# Patient Record
Sex: Female | Born: 1950 | Race: Black or African American | Hispanic: No | Smoking: Former smoker
Health system: Southern US, Community
[De-identification: ages and names within clinical notes are randomized; demographics above are authoritative.]

## PROBLEM LIST (undated history)

## (undated) DIAGNOSIS — I10 Essential (primary) hypertension: Secondary | ICD-10-CM

## (undated) DIAGNOSIS — E785 Hyperlipidemia, unspecified: Secondary | ICD-10-CM

## (undated) DIAGNOSIS — I499 Cardiac arrhythmia, unspecified: Secondary | ICD-10-CM

## (undated) DIAGNOSIS — Z9981 Dependence on supplemental oxygen: Secondary | ICD-10-CM

## (undated) DIAGNOSIS — J9620 Acute and chronic respiratory failure, unspecified whether with hypoxia or hypercapnia: Secondary | ICD-10-CM

## (undated) DIAGNOSIS — E039 Hypothyroidism, unspecified: Secondary | ICD-10-CM

## (undated) DIAGNOSIS — Z780 Asymptomatic menopausal state: Secondary | ICD-10-CM

## (undated) DIAGNOSIS — J449 Chronic obstructive pulmonary disease, unspecified: Secondary | ICD-10-CM

## (undated) DIAGNOSIS — J189 Pneumonia, unspecified organism: Secondary | ICD-10-CM

## (undated) HISTORY — PX: EYE SURGERY: SHX253

## (undated) HISTORY — DX: Asymptomatic menopausal state: Z78.0

## (undated) HISTORY — DX: Essential (primary) hypertension: I10

## (undated) HISTORY — DX: Chronic obstructive pulmonary disease, unspecified: J44.9

## (undated) HISTORY — DX: Hypothyroidism, unspecified: E03.9

## (undated) HISTORY — PX: SKIN GRAFT SPLIT THICKNESS ARM: SUR1301

## (undated) HISTORY — DX: Hyperlipidemia, unspecified: E78.5

## (undated) HISTORY — DX: Acute and chronic respiratory failure, unspecified whether with hypoxia or hypercapnia: J96.20

---

## 2002-09-28 ENCOUNTER — Emergency Department (HOSPITAL_COMMUNITY): Admission: EM | Admit: 2002-09-28 | Discharge: 2002-09-28 | Payer: Self-pay | Admitting: Emergency Medicine

## 2002-09-28 ENCOUNTER — Encounter: Payer: Self-pay | Admitting: Emergency Medicine

## 2007-11-16 ENCOUNTER — Encounter: Admission: RE | Admit: 2007-11-16 | Discharge: 2007-11-16 | Payer: Self-pay | Admitting: Family Medicine

## 2007-11-20 ENCOUNTER — Other Ambulatory Visit: Admission: RE | Admit: 2007-11-20 | Discharge: 2007-11-20 | Payer: Self-pay | Admitting: Endocrinology

## 2008-10-22 ENCOUNTER — Inpatient Hospital Stay (HOSPITAL_COMMUNITY): Admission: EM | Admit: 2008-10-22 | Discharge: 2008-10-24 | Payer: Self-pay | Admitting: Emergency Medicine

## 2008-12-24 ENCOUNTER — Ambulatory Visit: Payer: Self-pay | Admitting: Pulmonary Disease

## 2008-12-24 DIAGNOSIS — I10 Essential (primary) hypertension: Secondary | ICD-10-CM

## 2008-12-24 DIAGNOSIS — J438 Other emphysema: Secondary | ICD-10-CM | POA: Insufficient documentation

## 2008-12-24 DIAGNOSIS — E785 Hyperlipidemia, unspecified: Secondary | ICD-10-CM | POA: Insufficient documentation

## 2008-12-26 ENCOUNTER — Ambulatory Visit: Payer: Self-pay | Admitting: Pulmonary Disease

## 2008-12-30 ENCOUNTER — Encounter (HOSPITAL_COMMUNITY): Admission: RE | Admit: 2008-12-30 | Discharge: 2009-03-30 | Payer: Self-pay | Admitting: Internal Medicine

## 2009-04-01 ENCOUNTER — Encounter (HOSPITAL_COMMUNITY): Admission: RE | Admit: 2009-04-01 | Discharge: 2009-06-04 | Payer: Self-pay | Admitting: Endocrinology

## 2010-10-24 ENCOUNTER — Encounter: Payer: Self-pay | Admitting: Internal Medicine

## 2011-01-17 LAB — URINALYSIS, ROUTINE W REFLEX MICROSCOPIC
Bilirubin Urine: NEGATIVE
Glucose, UA: NEGATIVE mg/dL
Ketones, ur: NEGATIVE mg/dL
pH: 5.5 (ref 5.0–8.0)

## 2011-01-17 LAB — PROTIME-INR
INR: 1.1 (ref 0.00–1.49)
Prothrombin Time: 14.2 seconds (ref 11.6–15.2)

## 2011-01-17 LAB — BLOOD GAS, ARTERIAL
O2 Content: 2 L/min
O2 Saturation: 81.1 %
pCO2 arterial: 38 mmHg (ref 35.0–45.0)
pH, Arterial: 7.455 — ABNORMAL HIGH (ref 7.350–7.400)
pO2, Arterial: 45.6 mmHg — ABNORMAL LOW (ref 80.0–100.0)

## 2011-01-17 LAB — COMPREHENSIVE METABOLIC PANEL
AST: 23 U/L (ref 0–37)
Albumin: 2.7 g/dL — ABNORMAL LOW (ref 3.5–5.2)
Alkaline Phosphatase: 63 U/L (ref 39–117)
BUN: 3 mg/dL — ABNORMAL LOW (ref 6–23)
Chloride: 108 mEq/L (ref 96–112)
GFR calc Af Amer: >60 mL/min (ref >60)
Potassium: 4.1 mEq/L (ref 3.5–5.1)
Total Bilirubin: 0.4 mg/dL (ref 0.3–1.2)

## 2011-01-17 LAB — DIFFERENTIAL
Basophils Absolute: 0 10*3/uL (ref 0.0–0.1)
Basophils Relative: 0 % (ref 0–1)
Lymphocytes Relative: 23 % (ref 12–46)
Neutro Abs: 5.8 10*3/uL (ref 1.7–7.7)
Neutrophils Relative %: 65 % (ref 43–77)

## 2011-01-17 LAB — CBC
HCT: 29.9 % — ABNORMAL LOW (ref 36.0–46.0)
Platelets: 208 10*3/uL (ref 150–400)
Platelets: 218 10*3/uL (ref 150–400)
RBC: 3.48 MIL/uL — ABNORMAL LOW (ref 3.87–5.11)
WBC: 6.6 10*3/uL (ref 4.0–10.5)
WBC: 8.9 10*3/uL (ref 4.0–10.5)

## 2011-01-17 LAB — FOLATE: Folate: >20 ng/mL

## 2011-01-17 LAB — CULTURE, BLOOD (ROUTINE X 2): Culture: NO GROWTH

## 2011-01-17 LAB — CARDIAC PANEL(CRET KIN+CKTOT+MB+TROPI)
CK, MB: 0.8 ng/mL (ref 0.3–4.0)
Troponin I: <0.01 ng/mL (ref 0.00–0.06)

## 2011-01-17 LAB — POCT I-STAT, CHEM 8
Chloride: 104 mEq/L (ref 96–112)
HCT: 33 % — ABNORMAL LOW (ref 36.0–46.0)
Potassium: 3.8 mEq/L (ref 3.5–5.1)
Sodium: 139 mEq/L (ref 135–145)

## 2011-01-17 LAB — CK TOTAL AND CKMB (NOT AT ARMC): Total CK: 72 U/L (ref 7–177)

## 2011-01-17 LAB — RETICULOCYTES: Retic Ct Pct: 1.4 % (ref 0.4–3.1)

## 2011-01-17 LAB — APTT: aPTT: 36 seconds (ref 24–37)

## 2011-02-15 NOTE — H&P (Signed)
NAMEGIAVANNA, [name]             ACCOUNT NO.:  [id number]   MEDICAL RECORD NO.:  [id number]          PATIENT TYPE:  INP   LOCATION:  0112                         FAC[facility]   PHYSICIAN:  Michiel Cowboy, MDDATE OF BIRTH:  [birth year]   DATE OF ADMISSION:  10/22/2008  DATE OF DISCHARGE:                              HISTORY & PHYSICAL   PRIMARY CARE Darelle Kings:  Nurse Practitioner Christiana Fuchs with [facility].   CHIEF COMPLAINT:  Shortness of breath.   The patient is a 60 year old female with history of COPD and high blood  pressure, who for the past few weeks has not been feeling will and has  been progressively having worsening shortness of breath when she tries  to ambulate, particularly walking up and down stairs.  She had been  having for some time chest pain that is pleuritic and worsened with  breathing.  No fevers, but occasional chills.  No nausea, no vomiting.  No constipation or diarrhea.  She does have overall generalized malaise;  pains and aches everywhere, a little bit of a headache and a stuffed  nose.  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  She usually uses 2 liters of oxygen at bedtime and  is currently has had some mid 80s to low 90s on room air.   Otherwise, review of systems unremarkable.   PAST MEDICAL HISTORY:  1. COPD.  2. Hypertension.   SOCIAL HISTORY:  The patient used to smoke about a pack and a half to a  pack a day, quit 7 months ago but had an extensive smoking history.  Denies alcohol.  Her daughter is at bedside and she seems to be involved  in the mother's care.   FAMILY HISTORY:  Noncontributory.   ALLERGIES:  The patient not allergic to any medicines.   MEDICATIONS:  1. Advair dose unknown twice a day.  2. Ambien 5 mg at bedtime.  3. Lisinopril 40 mg once a day.  4. Statin 20 mg once a day.   PHYSICAL EXAMINATION:  VITALS:  Temperature 98.1, blood pressure 127/78,  pulse 116,  respirations 16.  Saturations 99% on room air, now down to  87, occasionally with minimal exertion.  On physical exam the patient appears to be thin and in no acute  distress.  HEENT:  Head:  Nontraumatic.  Somewhat dryish mucous membranes.  LUNGS: No wheezes appreciated; but definitely crackles particularly at  the bases, more left than right.  HEART:  Rapid but no murmurs appreciated.  ABDOMEN:  Soft, nontender and nondistended.  EXTREMITIES:  Lower extremities without clubbing, cyanosis or edema.  Strength 5/5 in all 4 extremities.   LABS:  White blood cell count 8.9, hemoglobin 11.2.  Sodium 149,  potassium 3.8, creatinine 0.9.  ABGs 7.455/38/45.6.  Blood cultures are  pending.  Chest x-ray showing left upper lobe infiltrate.   ASSESSMENT/PLAN:  1. This is a 60 year old female with history of COPD, now with      possible pneumonia.  2. Pneumonia:  Cover with Avelox and give guaifenesin.  Await blood  cultures, but probably will yield this patient not febrile.  Given      pleuritic chest pain and tachycardia,  to be complete we will      obtain a CT of the chest to rule out pulmonary embolus; and also to      evaluate to pneumonia better and evaluate for lung parenchyma.  3. Chronic obstructive pulmonary disease:  Will continue Advair.  Will      choose Xopenex, given the tachycardia;  Atrovent scheduled and      guaifenesin. The patient is currently not wheezing.  We will not      give steroids for right now and will continue to monitor.  4. Hypertension:  Continue lisinopril.  5. Hyperlipidemia:  Continue pravastatin.  6. Tachycardia:  Will give IV fluids.  Check TSH; may be related to      albuterol treatments versus hypoxia.  We will see if this will      resolve while the patient is fluid resuscitated and her hypoxia is      under control.  7. Prophylaxis:  Protonix plus Lovenox.      Michiel Cowboy, MD  Electronically Signed     AVD/MEDQ  D:  10/22/2008   T:  10/23/2008  Job:  [id number]   cc:   Christiana Fuchs, NP  [facility]

## 2011-11-01 ENCOUNTER — Other Ambulatory Visit: Payer: Self-pay | Admitting: Family Medicine

## 2011-11-01 DIAGNOSIS — Z92241 Personal history of systemic steroid therapy: Secondary | ICD-10-CM

## 2011-11-01 DIAGNOSIS — J4489 Other specified chronic obstructive pulmonary disease: Secondary | ICD-10-CM | POA: Diagnosis not present

## 2011-11-01 DIAGNOSIS — Z79899 Other long term (current) drug therapy: Secondary | ICD-10-CM | POA: Diagnosis not present

## 2011-11-01 DIAGNOSIS — I10 Essential (primary) hypertension: Secondary | ICD-10-CM | POA: Diagnosis not present

## 2011-11-01 DIAGNOSIS — Z780 Asymptomatic menopausal state: Secondary | ICD-10-CM

## 2011-11-01 DIAGNOSIS — J449 Chronic obstructive pulmonary disease, unspecified: Secondary | ICD-10-CM | POA: Diagnosis not present

## 2011-11-01 DIAGNOSIS — E0520 Thyrotoxicosis with toxic multinodular goiter without thyrotoxic crisis or storm: Secondary | ICD-10-CM | POA: Diagnosis not present

## 2011-11-01 DIAGNOSIS — Z1231 Encounter for screening mammogram for malignant neoplasm of breast: Secondary | ICD-10-CM

## 2011-11-01 DIAGNOSIS — E782 Mixed hyperlipidemia: Secondary | ICD-10-CM | POA: Diagnosis not present

## 2011-11-23 ENCOUNTER — Encounter: Payer: Self-pay | Admitting: *Deleted

## 2011-11-23 ENCOUNTER — Encounter: Payer: Self-pay | Admitting: Pulmonary Disease

## 2011-11-24 ENCOUNTER — Ambulatory Visit (INDEPENDENT_AMBULATORY_CARE_PROVIDER_SITE_OTHER)
Admission: RE | Admit: 2011-11-24 | Discharge: 2011-11-24 | Disposition: A | Discharge status: Home / Self Care | Payer: Medicare Other | Source: Ambulatory Visit | Attending: Pulmonary Disease | Admitting: Pulmonary Disease

## 2011-11-24 ENCOUNTER — Encounter: Payer: Self-pay | Admitting: Pulmonary Disease

## 2011-11-24 ENCOUNTER — Ambulatory Visit (INDEPENDENT_AMBULATORY_CARE_PROVIDER_SITE_OTHER): Payer: Medicare Other | Admitting: Pulmonary Disease

## 2011-11-24 VITALS — BP 128/82 | HR 74 | Temp 97.8°F | Ht 61.0 in | Wt 108.8 lb

## 2011-11-24 DIAGNOSIS — J438 Other emphysema: Secondary | ICD-10-CM

## 2011-11-24 DIAGNOSIS — R918 Other nonspecific abnormal finding of lung field: Secondary | ICD-10-CM | POA: Diagnosis not present

## 2011-11-24 NOTE — Assessment & Plan Note (Signed)
Ambulatory satn to qualify for lightweight portable oxygen  Contact me with oxygen form for airline once your tickets are booked Referral to pulmonary rehab Stay on advair & spiriva

## 2011-11-24 NOTE — Progress Notes (Signed)
  Subjective:    [name], female    DOB: [birth year], 61 y.o.   MRN: [id number]  HPI    Review of Systems  Constitutional: Negative for fever and unexpected weight change.  HENT: Positive for congestion. Negative for ear pain, nosebleeds, sore throat, rhinorrhea, sneezing, trouble swallowing, dental problem, postnasal drip and sinus pressure.   Eyes: Negative for redness and itching.  Respiratory: Positive for cough and shortness of breath. Negative for chest tightness and wheezing.   Cardiovascular: Negative for palpitations and leg swelling.  Gastrointestinal: Negative for nausea, vomiting and diarrhea.  Genitourinary: Negative for dysuria.  Musculoskeletal: Negative for joint swelling.  Skin: Negative for rash.  Neurological: Negative for headaches.  Hematological: Bruises/bleeds easily.  Psychiatric/Behavioral: Negative for dysphoric mood. The patient is nervous/anxious.        Objective:   Physical Exam        Assessment & Plan:

## 2011-11-24 NOTE — Progress Notes (Signed)
  Subjective:    [name], female    DOB: 1951/05/01, 61 y.o.   MRN: [id number]  HPI 11/24/2011  PCP - Nunzio Cory 61/F with severe COPD for evaluation of portable oxygen. She was seen by Dr Shelle Iron 12/24/08 after hospitalization for LUL pna. CT angio 3/10 showed cenrilobular emphysema.She was placed on oxygen around this time [facility] pt) . PFTs showed severe obstruction 3/10 FEv1 39%, DLCO 35%, mild air trapping Pulmonary rehab declined due to insurance issues. She is on an excellent bronchodilator regimen consisting of advair, spiriva, and as needed albuterol.  She describes a less than one block doe at a moderate pace on flat ground. She will get winded bringing groceries in from the car. She can climb a flight of stairs at her own pace. 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 SHe is compliant with nocturnal O2..She has not smoked since 3/10. She reports a 'smoker's cough' with minimal sticky phlegm She is planning a trip to [place] this summer to visit her daughter & stay there for 3-4 weeks & has questions about O2. She desaturates to 88% on first lap, recovered on o2 & did not desaturate on 2L O2.       Review of Systems Constitutional: negative for anorexia, fevers and sweats  Eyes: negative for irritation, redness and visual disturbance  Ears, nose, mouth, throat, and face: negative for earaches, epistaxis, nasal congestion and sore throat  Respiratory: negative for sputum and wheezing  Cardiovascular: negative for chest pain, dyspnea, lower extremity edema, orthopnea, palpitations and syncope  Gastrointestinal: negative for abdominal pain, constipation, diarrhea, melena, nausea and vomiting  Genitourinary:negative for dysuria, frequency and hematuria  Hematologic/lymphatic: negative for bleeding, easy bruising and lymphadenopathy  Musculoskeletal:negative for arthralgias, muscle weakness and stiff joints  Neurological: negative for  coordination problems, gait problems, headaches and weakness  Endocrine: negative for diabetic symptoms including polydipsia, polyuria and weight loss     Objective:   Physical Exam  Gen. Pleasant, thin woman, in no distress, normal affect ENT - no lesions, no post nasal drip Neck: No JVD, no thyromegaly, no carotid bruits Lungs: no use of accessory muscles, no dullness to percussion, decreased without rales or rhonchi  Cardiovascular: Rhythm regular, heart sounds  normal, no murmurs or gallops, no peripheral edema Abdomen: soft and non-tender, no hepatosplenomegaly, BS normal. Musculoskeletal: No deformities, no cyanosis or clubbing Neuro:  alert, non focal       Assessment & Plan:

## 2011-11-24 NOTE — Patient Instructions (Signed)
Ambulatory satn to qualify for lightweight portable oxygen  COntinue to use oxygen durign sleep Contact me with oxygen form for airline once your tickets are booked Referral to pulmonary rehab

## 2011-12-22 ENCOUNTER — Telehealth: Payer: Self-pay | Admitting: Pulmonary Disease

## 2011-12-22 NOTE — Telephone Encounter (Signed)
Qualifying sats from 11/24/11 faxed to [place] at [phone].

## 2011-12-23 ENCOUNTER — Telehealth: Payer: Self-pay | Admitting: Pulmonary Disease

## 2011-12-23 NOTE — Telephone Encounter (Signed)
Per Salvadore Oxford just faxed this form back to [facility]. I spoke with Thayer Ohm in the [place] office at [phone] and he did receive this. Nothing further is needed at this time.

## 2011-12-23 NOTE — Telephone Encounter (Signed)
Made in error

## 2012-05-01 DIAGNOSIS — Z1211 Encounter for screening for malignant neoplasm of colon: Secondary | ICD-10-CM | POA: Diagnosis not present

## 2012-05-01 DIAGNOSIS — Z79899 Other long term (current) drug therapy: Secondary | ICD-10-CM | POA: Diagnosis not present

## 2012-05-01 DIAGNOSIS — I10 Essential (primary) hypertension: Secondary | ICD-10-CM | POA: Diagnosis not present

## 2012-05-01 DIAGNOSIS — E782 Mixed hyperlipidemia: Secondary | ICD-10-CM | POA: Diagnosis not present

## 2012-05-01 DIAGNOSIS — J449 Chronic obstructive pulmonary disease, unspecified: Secondary | ICD-10-CM | POA: Diagnosis not present

## 2012-05-01 DIAGNOSIS — E039 Hypothyroidism, unspecified: Secondary | ICD-10-CM | POA: Diagnosis not present

## 2012-05-01 DIAGNOSIS — Z1239 Encounter for other screening for malignant neoplasm of breast: Secondary | ICD-10-CM | POA: Diagnosis not present

## 2012-05-01 DIAGNOSIS — J4489 Other specified chronic obstructive pulmonary disease: Secondary | ICD-10-CM | POA: Diagnosis not present

## 2012-05-01 DIAGNOSIS — Z13820 Encounter for screening for osteoporosis: Secondary | ICD-10-CM | POA: Diagnosis not present

## 2012-06-06 DIAGNOSIS — Z1211 Encounter for screening for malignant neoplasm of colon: Secondary | ICD-10-CM | POA: Diagnosis not present

## 2012-06-06 DIAGNOSIS — K5730 Diverticulosis of large intestine without perforation or abscess without bleeding: Secondary | ICD-10-CM | POA: Diagnosis not present

## 2012-06-06 DIAGNOSIS — D126 Benign neoplasm of colon, unspecified: Secondary | ICD-10-CM | POA: Diagnosis not present

## 2012-11-30 ENCOUNTER — Telehealth: Payer: Self-pay | Admitting: Pulmonary Disease

## 2012-11-30 NOTE — Telephone Encounter (Signed)
Pt has been scheduled for 12/11/12 @ 4:15 in [place]. She will call us and let us know if the [place] location doesn't work for her daughter.

## 2012-12-11 ENCOUNTER — Ambulatory Visit (INDEPENDENT_AMBULATORY_CARE_PROVIDER_SITE_OTHER): Payer: Medicare Other | Admitting: Pulmonary Disease

## 2012-12-11 ENCOUNTER — Encounter: Payer: Self-pay | Admitting: Pulmonary Disease

## 2012-12-11 VITALS — BP 116/72 | HR 86 | Temp 97.9°F | Ht 60.0 in | Wt 100.0 lb

## 2012-12-11 DIAGNOSIS — J438 Other emphysema: Secondary | ICD-10-CM

## 2012-12-11 NOTE — Progress Notes (Signed)
  Subjective:    [name], female    DOB: [birth year], 62 y.o.   MRN: [id number]  HPI 11/24/2011 - Initial oV  PCP - Nunzio Cory  60/F with severe COPD for evaluation of portable oxygen.  She was seen by Dr Shelle Iron 12/24/08 after hospitalization for LUL pna. CT angio 3/10 showed centrilobular emphysema.She was placed on oxygen around this time [facility] pt) . PFTs showed severe obstruction 3/10 FEv1 39%, DLCO 35%, mild air trapping  Pulmonary rehab declined due to insurance issues. She is on an excellent bronchodilator regimen consisting of advair, spiriva, and as needed albuterol.  She describes a less than one block doe at a moderate pace on flat ground. She will get winded bringing groceries in from the car. She can climb a flight of stairs at her own pace. 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 SHe is compliant with nocturnal O2..She has not smoked since 3/10. She reports a 'smoker's cough' with minimal sticky phlegm  She is planning a trip to [place] this summer to visit her daughter & stay there for 3-4 weeks & has questions about O2.  She desaturates to 88% on first lap, recovered on o2 & did not desaturate on 2L O2   12/11/2012 57yr FU Here for recertification of O2 Uses O2 during sleep, feels portable tank is too heavy & hurts her shoulder C/o clear phlegm, no wheezing, compliant with advair & spiriva, -does not need rescue albuterol much Did not take the flu shot Tolerates atenolol She desaturated to 87% on1 lap    Review of Systems neg for any significant sore throat, dysphagia, itching, sneezing, nasal congestion or excess/ purulent secretions, fever, chills, sweats, unintended wt loss, pleuritic or exertional cp, hempoptysis, orthopnea pnd or change in chronic leg swelling. Also denies presyncope, palpitations, heartburn, abdominal pain, nausea, vomiting, diarrhea or change in bowel or urinary habits, dysuria,hematuria, rash, arthralgias, visual  complaints, headache, numbness weakness or ataxia.     Objective:   Physical Exam  Gen. Pleasant, thin woman, in no distress ENT - no lesions, no post nasal drip Neck: No JVD, no thyromegaly, no carotid bruits Lungs: no use of accessory muscles, no dullness to percussion, decreased without rales or rhonchi  Cardiovascular: Rhythm regular, heart sounds  normal, no murmurs or gallops, no peripheral edema Musculoskeletal: No deformities, no cyanosis or clubbing         Assessment & Plan:

## 2012-12-11 NOTE — Assessment & Plan Note (Signed)
Obtain CAt score OK for portable concentrator if she will qualify Ct advair , spiriva Will qualify for pulm rehab if she is interested

## 2012-12-11 NOTE — Patient Instructions (Addendum)
We will send in recertification for oxygen Stay on advair & spiriva

## 2012-12-27 DIAGNOSIS — I10 Essential (primary) hypertension: Secondary | ICD-10-CM | POA: Diagnosis not present

## 2012-12-27 DIAGNOSIS — R7301 Impaired fasting glucose: Secondary | ICD-10-CM | POA: Diagnosis not present

## 2012-12-27 DIAGNOSIS — E782 Mixed hyperlipidemia: Secondary | ICD-10-CM | POA: Diagnosis not present

## 2012-12-27 DIAGNOSIS — J441 Chronic obstructive pulmonary disease with (acute) exacerbation: Secondary | ICD-10-CM | POA: Diagnosis not present

## 2012-12-27 DIAGNOSIS — E039 Hypothyroidism, unspecified: Secondary | ICD-10-CM | POA: Diagnosis not present

## 2012-12-27 DIAGNOSIS — Z79899 Other long term (current) drug therapy: Secondary | ICD-10-CM | POA: Diagnosis not present

## 2013-07-30 ENCOUNTER — Other Ambulatory Visit: Payer: Self-pay | Admitting: *Deleted

## 2013-07-30 DIAGNOSIS — E782 Mixed hyperlipidemia: Secondary | ICD-10-CM | POA: Diagnosis not present

## 2013-07-30 DIAGNOSIS — E039 Hypothyroidism, unspecified: Secondary | ICD-10-CM | POA: Diagnosis not present

## 2013-07-30 DIAGNOSIS — J4489 Other specified chronic obstructive pulmonary disease: Secondary | ICD-10-CM | POA: Diagnosis not present

## 2013-07-30 DIAGNOSIS — Z23 Encounter for immunization: Secondary | ICD-10-CM | POA: Diagnosis not present

## 2013-07-30 DIAGNOSIS — J449 Chronic obstructive pulmonary disease, unspecified: Secondary | ICD-10-CM | POA: Diagnosis not present

## 2013-07-30 DIAGNOSIS — I10 Essential (primary) hypertension: Secondary | ICD-10-CM | POA: Diagnosis not present

## 2013-07-30 DIAGNOSIS — Z79899 Other long term (current) drug therapy: Secondary | ICD-10-CM | POA: Diagnosis not present

## 2013-07-30 DIAGNOSIS — R7301 Impaired fasting glucose: Secondary | ICD-10-CM | POA: Diagnosis not present

## 2013-07-30 MED ORDER — TIOTROPIUM BROMIDE MONOHYDRATE 18 MCG IN CAPS: 18.0000 ug | ORAL_CAPSULE | Freq: Every day | RESPIRATORY_TRACT | Status: DC

## 2013-07-30 MED ORDER — ALBUTEROL SULFATE HFA 108 (90 BASE) MCG/ACT IN AERS: 2.0000 | INHALATION_SPRAY | RESPIRATORY_TRACT | Status: DC | PRN

## 2013-07-30 MED ORDER — FLUTICASONE-SALMETEROL 250-50 MCG/DOSE IN AEPB: 1.0000 | INHALATION_SPRAY | Freq: Two times a day (BID) | RESPIRATORY_TRACT | Status: DC

## 2013-10-19 ENCOUNTER — Ambulatory Visit: Payer: Medicare Other

## 2013-10-19 ENCOUNTER — Ambulatory Visit (INDEPENDENT_AMBULATORY_CARE_PROVIDER_SITE_OTHER): Payer: Medicare Other | Admitting: Family Medicine

## 2013-10-19 ENCOUNTER — Encounter (HOSPITAL_COMMUNITY): Payer: Self-pay | Admitting: Emergency Medicine

## 2013-10-19 ENCOUNTER — Inpatient Hospital Stay (HOSPITAL_COMMUNITY)
Admission: EM | Admit: 2013-10-19 | Discharge: 2013-10-21 | DRG: 189 | Disposition: A | Discharge status: Home / Self Care | Payer: Medicare Other | Attending: Internal Medicine | Admitting: Internal Medicine

## 2013-10-19 VITALS — BP 132/90 | HR 111 | Temp 98.4°F | Resp 28 | Ht 60.75 in | Wt 98.0 lb

## 2013-10-19 DIAGNOSIS — E039 Hypothyroidism, unspecified: Secondary | ICD-10-CM | POA: Diagnosis present

## 2013-10-19 DIAGNOSIS — Z9981 Dependence on supplemental oxygen: Secondary | ICD-10-CM | POA: Diagnosis not present

## 2013-10-19 DIAGNOSIS — Z87891 Personal history of nicotine dependence: Secondary | ICD-10-CM | POA: Diagnosis not present

## 2013-10-19 DIAGNOSIS — J9620 Acute and chronic respiratory failure, unspecified whether with hypoxia or hypercapnia: Principal | ICD-10-CM | POA: Diagnosis present

## 2013-10-19 DIAGNOSIS — J9601 Acute respiratory failure with hypoxia: Secondary | ICD-10-CM

## 2013-10-19 DIAGNOSIS — J449 Chronic obstructive pulmonary disease, unspecified: Secondary | ICD-10-CM | POA: Diagnosis not present

## 2013-10-19 DIAGNOSIS — J438 Other emphysema: Secondary | ICD-10-CM | POA: Diagnosis not present

## 2013-10-19 DIAGNOSIS — J189 Pneumonia, unspecified organism: Secondary | ICD-10-CM | POA: Diagnosis present

## 2013-10-19 DIAGNOSIS — E785 Hyperlipidemia, unspecified: Secondary | ICD-10-CM | POA: Diagnosis not present

## 2013-10-19 DIAGNOSIS — R0789 Other chest pain: Secondary | ICD-10-CM | POA: Diagnosis not present

## 2013-10-19 DIAGNOSIS — J9600 Acute respiratory failure, unspecified whether with hypoxia or hypercapnia: Secondary | ICD-10-CM | POA: Diagnosis not present

## 2013-10-19 DIAGNOSIS — R0603 Acute respiratory distress: Secondary | ICD-10-CM

## 2013-10-19 DIAGNOSIS — I10 Essential (primary) hypertension: Secondary | ICD-10-CM | POA: Diagnosis not present

## 2013-10-19 DIAGNOSIS — J441 Chronic obstructive pulmonary disease with (acute) exacerbation: Secondary | ICD-10-CM

## 2013-10-19 DIAGNOSIS — R0602 Shortness of breath: Secondary | ICD-10-CM | POA: Diagnosis not present

## 2013-10-19 LAB — POCT CBC
Granulocyte percent: 59.6 %G (ref 37–80)
HCT, POC: 37.3 % — AB (ref 37.7–47.9)
Hemoglobin: 11.3 g/dL — AB (ref 12.2–16.2)
Lymph, poc: 2.8 (ref 0.6–3.4)
MCH, POC: 28.8 pg (ref 27–31.2)
MCHC: 30.3 g/dL — AB (ref 31.8–35.4)
MCV: 95.1 fL (ref 80–97)
MID (cbc): 0.6 (ref 0–0.9)
MPV: 8.2 fL (ref 0–99.8)
POC Granulocyte: 5 (ref 2–6.9)
POC LYMPH PERCENT: 33 %L (ref 10–50)
POC MID %: 7.4 %M (ref 0–12)
Platelet Count, POC: 292 10*3/uL (ref 142–424)
RBC: 3.92 M/uL — AB (ref 4.04–5.48)
RDW, POC: 13.5 %
WBC: 8.4 10*3/uL (ref 4.6–10.2)

## 2013-10-19 LAB — BASIC METABOLIC PANEL
BUN: 4 mg/dL — ABNORMAL LOW (ref 6–23)
CO2: 26 mEq/L (ref 19–32)
Chloride: 98 mEq/L (ref 96–112)
Creatinine, Ser: 0.42 mg/dL — ABNORMAL LOW (ref 0.50–1.10)
GFR calc Af Amer: >90 mL/min (ref >90)
GFR calc non Af Amer: >90 mL/min (ref >90)
Glucose, Bld: 128 mg/dL — ABNORMAL HIGH (ref 70–99)
Potassium: 3.8 mEq/L (ref 3.7–5.3)

## 2013-10-19 LAB — CBC WITH DIFFERENTIAL/PLATELET
Basophils Absolute: 0 K/uL (ref 0.0–0.1)
Basophils Relative: 0 % (ref 0–1)
Eosinophils Absolute: 0.1 10*3/uL (ref 0.0–0.7)
Eosinophils Relative: 1 % (ref 0–5)
HCT: 33.1 % — ABNORMAL LOW (ref 36.0–46.0)
Hemoglobin: 11.3 g/dL — ABNORMAL LOW (ref 12.0–15.0)
Lymphocytes Relative: 44 % (ref 12–46)
Lymphs Abs: 3.6 10*3/uL (ref 0.7–4.0)
MCH: 30.4 pg (ref 26.0–34.0)
MCHC: 34.1 g/dL (ref 30.0–36.0)
MCV: 89 fL (ref 78.0–100.0)
Monocytes Absolute: 0.8 10*3/uL (ref 0.1–1.0)
Monocytes Relative: 9 % (ref 3–12)
Neutro Abs: 3.7 K/uL (ref 1.7–7.7)
Neutrophils Relative %: 46 % (ref 43–77)
Platelets: 279 10*3/uL (ref 150–400)
RBC: 3.72 MIL/uL — ABNORMAL LOW (ref 3.87–5.11)
RDW: 12.7 % (ref 11.5–15.5)
WBC: 8.2 K/uL (ref 4.0–10.5)

## 2013-10-19 LAB — BASIC METABOLIC PANEL WITH GFR
Calcium: 9.9 mg/dL (ref 8.4–10.5)
Sodium: 140 meq/L (ref 137–147)

## 2013-10-19 MED ORDER — ALBUTEROL SULFATE (2.5 MG/3ML) 0.083% IN NEBU
5.0000 mg | INHALATION_SOLUTION | Freq: Once | RESPIRATORY_TRACT | Status: AC
  Administered 2013-10-19: 5 mg via RESPIRATORY_TRACT
  Filled 2013-10-19: qty 6

## 2013-10-19 MED ORDER — IPRATROPIUM BROMIDE 0.02 % IN SOLN
0.5000 mg | Freq: Once | RESPIRATORY_TRACT | Status: AC
  Administered 2013-10-19: 0.5 mg via RESPIRATORY_TRACT

## 2013-10-19 MED ORDER — DEXTROSE 5 % IV SOLN
1.0000 g | Freq: Once | INTRAVENOUS | Status: AC
  Administered 2013-10-19: 1 g via INTRAVENOUS
  Filled 2013-10-19: qty 10

## 2013-10-19 MED ORDER — ONDANSETRON HCL 4 MG/2ML IJ SOLN
4.0000 mg | Freq: Once | INTRAMUSCULAR | Status: AC
  Administered 2013-10-19: 4 mg via INTRAVENOUS
  Filled 2013-10-19: qty 2

## 2013-10-19 MED ORDER — AZITHROMYCIN 250 MG PO TABS
500.0000 mg | ORAL_TABLET | Freq: Once | ORAL | Status: AC
  Administered 2013-10-19: 500 mg via ORAL
  Filled 2013-10-19: qty 2

## 2013-10-19 MED ORDER — IPRATROPIUM BROMIDE 0.02 % IN SOLN
0.5000 mg | Freq: Once | RESPIRATORY_TRACT | Status: AC
  Administered 2013-10-19: 0.5 mg via RESPIRATORY_TRACT
  Filled 2013-10-19: qty 2.5

## 2013-10-19 MED ORDER — CEFTRIAXONE SODIUM 1 G IJ SOLR
1.0000 g | INTRAMUSCULAR | Status: DC
  Administered 2013-10-20: 1 g via INTRAVENOUS
  Filled 2013-10-19: qty 10

## 2013-10-19 MED ORDER — PREDNISONE 20 MG PO TABS
60.0000 mg | ORAL_TABLET | Freq: Once | ORAL | Status: AC
  Administered 2013-10-19: 60 mg via ORAL
  Filled 2013-10-19: qty 3

## 2013-10-19 MED ORDER — ALBUTEROL SULFATE (2.5 MG/3ML) 0.083% IN NEBU
2.5000 mg | INHALATION_SOLUTION | Freq: Once | RESPIRATORY_TRACT | Status: AC
  Administered 2013-10-19: 2.5 mg via RESPIRATORY_TRACT

## 2013-10-19 MED ORDER — DEXTROSE 5 % IV SOLN
500.0000 mg | INTRAVENOUS | Status: DC
  Administered 2013-10-21: 500 mg via INTRAVENOUS
  Filled 2013-10-19: qty 500
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
## 2013-10-19 NOTE — Progress Notes (Signed)
63 year old woman with known COPD and emphysema comes in with chronic cough, worsening shortness of breath over the last 3 days with significant congestion and weakness. She's been taking her usual pulmonary toilet but this has not resulted in reversal of the downward course.  Patient has had some chills and sweats but is unsure about fever. Her cough is no longer productive. He had no loss of consciousness or new chest pain. She's had no nausea or vomiting.  Patient denies problems referable to her throat or ears, swallowing difficulties, nausea vomiting or diarrhea.  Objective: Patient has mild tachypnea but is alert and cooperative, she's a good historian. She's coming by her daughter and granddaughter. HEENT: Unremarkable the exception of missing teeth Neck: Supple no adenopathy or thyromegaly Chest: Decreased breath sounds diffusely although I can hear bibasilar rales worse on the right. Heart: Regular, 100 beats per minute, no murmur or gallop Extremities: No edema, marked muscle wasting of 4 extremities Skin: No rash or suspicious lesions  [facility] reading (PRIMARY) by  Dr. Joseph Art: Chest x-ray shows chronic changes with what appears to be a new infiltrate in the right lower lobe with a silhouette sign. She has flattened diaphragms.  Results for orders placed in visit on 10/19/13  POCT CBC      Result Value Range   WBC 8.4  4.6 - 10.2 K/uL   Lymph, poc 2.8  0.6 - 3.4   POC LYMPH PERCENT 33.0  10 - 50 %L   MID (cbc) 0.6  0 - 0.9   POC MID % 7.4  0 - 12 %M   POC Granulocyte 5.0  2 - 6.9   Granulocyte percent 59.6  37 - 80 %G   RBC 3.92 (*) 4.04 - 5.48 M/uL   Hemoglobin 11.3 (*) 12.2 - 16.2 g/dL   HCT, POC 37.3 (*) 37.7 - 47.9 %   MCV 95.1  80 - 97 fL   MCH, POC 28.8  27 - 31.2 pg   MCHC 30.3 (*) 31.8 - 35.4 g/dL   RDW, POC 13.5     Platelet Count, POC 292  142 - 424 K/uL   MPV 8.2  0 - 99.8 fL   Assessment: Patient appears to have early pneumonia and early respiratory failure.  I am loathe to send her home at this point because of her worsening symptoms very  Plan: Transfer to emergency department for potential admission and or further observation, treatment , and evaluation  Signed, Robyn Haber M.D..
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
## 2013-10-19 NOTE — ED Notes (Signed)
Pt ambulated in hallway one stand by assist, o2 saturations remained between 92-95%. Pt became visibly tired while walking. Pt remained on 2L throughout ambulation.

## 2013-10-19 NOTE — ED Notes (Signed)
Per EMS, patient went to [facility] for difficulty breathing x3 weeks. Patient reports sometimes coughing up thick greenish-brown sputum. The MD at [facility] suspects pneumonia in the RLL and sent patient here for further evaluation. Patient has a hx of emphysema and COPD. Patient was 91% on RA and 100% on 4L. Patient is usually on 2L at home for her COPD. Denies CP and ST on monitor. BP 161/89

## 2013-10-20 DIAGNOSIS — E039 Hypothyroidism, unspecified: Secondary | ICD-10-CM | POA: Diagnosis not present

## 2013-10-20 DIAGNOSIS — J441 Chronic obstructive pulmonary disease with (acute) exacerbation: Secondary | ICD-10-CM | POA: Diagnosis not present

## 2013-10-20 DIAGNOSIS — J9600 Acute respiratory failure, unspecified whether with hypoxia or hypercapnia: Secondary | ICD-10-CM | POA: Diagnosis not present

## 2013-10-20 DIAGNOSIS — J189 Pneumonia, unspecified organism: Secondary | ICD-10-CM | POA: Diagnosis not present

## 2013-10-20 LAB — CBC
HCT: 30.7 % — ABNORMAL LOW (ref 36.0–46.0)
Hemoglobin: 10.3 g/dL — ABNORMAL LOW (ref 12.0–15.0)
MCH: 29.9 pg (ref 26.0–34.0)
MCHC: 33.6 g/dL (ref 30.0–36.0)
MCV: 89 fL (ref 78.0–100.0)
Platelets: 256 10*3/uL (ref 150–400)
RBC: 3.45 MIL/uL — ABNORMAL LOW (ref 3.87–5.11)
RDW: 13 % (ref 11.5–15.5)
WBC: 7.3 10*3/uL (ref 4.0–10.5)

## 2013-10-20 LAB — STREP PNEUMONIAE URINARY ANTIGEN: Strep Pneumo Urinary Antigen: NEGATIVE

## 2013-10-20 LAB — COMPREHENSIVE METABOLIC PANEL
ALK PHOS: 76 U/L (ref 39–117)
ALT: 39 U/L — ABNORMAL HIGH (ref 0–35)
AST: 24 U/L (ref 0–37)
Albumin: 3.2 g/dL — ABNORMAL LOW (ref 3.5–5.2)
BUN: 8 mg/dL (ref 6–23)
CO2: 25 mEq/L (ref 19–32)
Calcium: 9.3 mg/dL (ref 8.4–10.5)
Chloride: 96 mEq/L (ref 96–112)
Creatinine, Ser: 0.43 mg/dL — ABNORMAL LOW (ref 0.50–1.10)
GFR calc Af Amer: >90 mL/min (ref >90)
GFR calc non Af Amer: >90 mL/min (ref >90)
Glucose, Bld: 314 mg/dL — ABNORMAL HIGH (ref 70–99)
POTASSIUM: 4 meq/L (ref 3.7–5.3)
SODIUM: 137 meq/L (ref 137–147)
Total Bilirubin: <0.2 mg/dL — ABNORMAL LOW (ref 0.3–1.2)
Total Protein: 7.3 g/dL (ref 6.0–8.3)

## 2013-10-20 LAB — GLUCOSE, CAPILLARY: GLUCOSE-CAPILLARY: 263 mg/dL — AB (ref 70–99)

## 2013-10-20 MED ORDER — ALBUTEROL SULFATE (2.5 MG/3ML) 0.083% IN NEBU: 2.5000 mg | INHALATION_SOLUTION | RESPIRATORY_TRACT | Status: DC | PRN

## 2013-10-20 MED ORDER — ALBUTEROL SULFATE (2.5 MG/3ML) 0.083% IN NEBU: 2.5000 mg | INHALATION_SOLUTION | Freq: Four times a day (QID) | RESPIRATORY_TRACT | Status: DC

## 2013-10-20 MED ORDER — ENOXAPARIN SODIUM 40 MG/0.4ML ~~LOC~~ SOLN
40.0000 mg | SUBCUTANEOUS | Status: DC
  Filled 2013-10-20: qty 0.4

## 2013-10-20 MED ORDER — IPRATROPIUM-ALBUTEROL 0.5-2.5 (3) MG/3ML IN SOLN
3.0000 mL | Freq: Four times a day (QID) | RESPIRATORY_TRACT | Status: DC
  Administered 2013-10-20 (×4): 3 mL via RESPIRATORY_TRACT
  Filled 2013-10-20 (×4): qty 3

## 2013-10-20 MED ORDER — LEVOTHYROXINE SODIUM 50 MCG PO TABS
50.0000 ug | ORAL_TABLET | Freq: Every day | ORAL | Status: DC
  Administered 2013-10-20 – 2013-10-21 (×2): 50 ug via ORAL
  Filled 2013-10-20 (×3): qty 1

## 2013-10-20 MED ORDER — OMEGA-3-ACID ETHYL ESTERS 1 G PO CAPS
1.0000 g | ORAL_CAPSULE | Freq: Every day | ORAL | Status: DC
  Administered 2013-10-20 – 2013-10-21 (×2): 1 g via ORAL
  Filled 2013-10-20 (×2): qty 1

## 2013-10-20 MED ORDER — GUAIFENESIN ER 600 MG PO TB12
600.0000 mg | ORAL_TABLET | Freq: Two times a day (BID) | ORAL | Status: DC
  Administered 2013-10-20 – 2013-10-21 (×4): 600 mg via ORAL
  Filled 2013-10-20 (×5): qty 1

## 2013-10-20 MED ORDER — ATENOLOL 25 MG PO TABS
25.0000 mg | ORAL_TABLET | Freq: Every day | ORAL | Status: DC
  Administered 2013-10-20 – 2013-10-21 (×2): 25 mg via ORAL
  Filled 2013-10-20 (×2): qty 1

## 2013-10-20 MED ORDER — IPRATROPIUM BROMIDE 0.02 % IN SOLN: 0.5000 mg | Freq: Four times a day (QID) | RESPIRATORY_TRACT | Status: DC

## 2013-10-20 MED ORDER — OMEGA-3 FATTY ACIDS 1000 MG PO CAPS: 1.0000 g | ORAL_CAPSULE | Freq: Two times a day (BID) | ORAL | Status: DC

## 2013-10-20 MED ORDER — ACETAMINOPHEN 325 MG PO TABS
650.0000 mg | ORAL_TABLET | Freq: Four times a day (QID) | ORAL | Status: DC | PRN
  Administered 2013-10-21: 650 mg via ORAL
  Filled 2013-10-20: qty 2

## 2013-10-20 MED ORDER — ENOXAPARIN SODIUM 30 MG/0.3ML ~~LOC~~ SOLN
30.0000 mg | SUBCUTANEOUS | Status: DC
  Administered 2013-10-20: 30 mg via SUBCUTANEOUS
  Filled 2013-10-20 (×2): qty 0.3

## 2013-10-20 MED ORDER — IPRATROPIUM BROMIDE 0.02 % IN SOLN: 0.5000 mg | RESPIRATORY_TRACT | Status: DC | PRN

## 2013-10-20 MED ORDER — SODIUM CHLORIDE 0.9 % IV SOLN
INTRAVENOUS | Status: AC
  Administered 2013-10-20 (×2): via INTRAVENOUS

## 2013-10-20 MED ORDER — ONDANSETRON HCL 4 MG PO TABS: 4.0000 mg | ORAL_TABLET | Freq: Four times a day (QID) | ORAL | Status: DC | PRN

## 2013-10-20 MED ORDER — ACETAMINOPHEN 650 MG RE SUPP: 650.0000 mg | Freq: Four times a day (QID) | RECTAL | Status: DC | PRN

## 2013-10-20 MED ORDER — PREDNISONE 50 MG PO TABS
50.0000 mg | ORAL_TABLET | Freq: Every day | ORAL | Status: DC
  Administered 2013-10-20 – 2013-10-21 (×2): 50 mg via ORAL
  Filled 2013-10-20 (×4): qty 1

## 2013-10-20 MED ORDER — ADULT MULTIVITAMIN W/MINERALS CH
1.0000 | ORAL_TABLET | Freq: Every day | ORAL | Status: DC
Start: 2013-10-20 — End: 2013-10-21
  Administered 2013-10-20 – 2013-10-21 (×2): 1 via ORAL
  Filled 2013-10-20 (×2): qty 1

## 2013-10-20 MED ORDER — METHYLPREDNISOLONE SODIUM SUCC 125 MG IJ SOLR
60.0000 mg | Freq: Two times a day (BID) | INTRAMUSCULAR | Status: DC
  Administered 2013-10-20: 60 mg via INTRAVENOUS
  Filled 2013-10-20 (×3): qty 0.96

## 2013-10-20 MED ORDER — HYDROCODONE-ACETAMINOPHEN 5-325 MG PO TABS
1.0000 | ORAL_TABLET | ORAL | Status: DC | PRN
  Administered 2013-10-20: 2 via ORAL
  Filled 2013-10-20: qty 2

## 2013-10-20 MED ORDER — ONDANSETRON HCL 4 MG/2ML IJ SOLN
4.0000 mg | Freq: Four times a day (QID) | INTRAMUSCULAR | Status: DC | PRN
  Administered 2013-10-20: 4 mg via INTRAVENOUS
  Filled 2013-10-20: qty 2

## 2013-10-20 MED ORDER — BIOTENE DRY MOUTH MT LIQD
15.0000 mL | Freq: Two times a day (BID) | OROMUCOSAL | Status: DC
  Administered 2013-10-20 – 2013-10-21 (×2): 15 mL via OROMUCOSAL

## 2013-10-20 MED ORDER — IPRATROPIUM-ALBUTEROL 0.5-2.5 (3) MG/3ML IN SOLN
3.0000 mL | Freq: Three times a day (TID) | RESPIRATORY_TRACT | Status: DC
  Administered 2013-10-21: 3 mL via RESPIRATORY_TRACT
  Filled 2013-10-20: qty 3

## 2013-10-20 MED ORDER — SIMVASTATIN 10 MG PO TABS
10.0000 mg | ORAL_TABLET | Freq: Every day | ORAL | Status: DC
  Administered 2013-10-20: 10 mg via ORAL
  Filled 2013-10-20 (×2): qty 1

## 2013-10-20 NOTE — Progress Notes (Signed)
Order for respiratory consult with education on how to use nebulizer and MDI treatments.  Discussed with patient on proper use of MDI and neb treatments.  Patient was able to teach back.  Was provided with a spacer to use with inhalers at home.  Patient stated that she did not have any further questions.

## 2013-10-20 NOTE — Evaluation (Signed)
Physical Therapy Evaluation Patient Details Name: [name] MRN: [id number] DOB: [birth year] Today's Date: 10/20/2013 Time: [id number] PT Time Calculation (min): 18 min  PT Assessment / Plan / Recommendation History of Present Illness  Pt admitted for acute resp failure/COPD exacerbation.  Clinical Impression  Pt admitted with COPD exacerbation/acute resp failure. Pt currently with functional limitations due to the deficits listed below (see PT Problem List).  Pt will benefit from skilled PT to increase their independence and safety with mobility to allow discharge to the venue listed below.       PT Assessment  Patient needs continued PT services    Follow Up Recommendations  No PT follow up    Does the patient have the potential to tolerate intense rehabilitation      Barriers to Discharge        Equipment Recommendations  None recommended by PT    Recommendations for Other Services     Frequency Min 3X/week    Precautions / Restrictions Precautions Precautions: None   Pertinent Vitals/Pain 0/10      Mobility  Bed Mobility Overal bed mobility: Modified Independent Transfers Overall transfer level: Needs assistance Transfers: Sit to/from Stand;Stand Pivot Transfers Sit to Stand: Min guard Stand pivot transfers: Min guard Ambulation/Gait Ambulation/Gait assistance: Min guard Ambulation Distance (Feet): 50 Feet Assistive device: None Gait Pattern/deviations: Step-through pattern;Decreased stride length Gait velocity: decreased Gait velocity interpretation: <1.8 ft/sec, indicative of risk for recurrent falls General Gait Details: DOE    Exercises     PT Diagnosis: Difficulty walking;Generalized weakness  PT Problem List: Decreased strength;Decreased activity tolerance;Decreased mobility;Cardiopulmonary status limiting activity PT Treatment Interventions: Gait training;Stair training;Functional mobility training;Therapeutic activities;Therapeutic  exercise;Patient/family education     PT Goals(Current goals can be found in the care plan section) Acute Rehab PT Goals Patient Stated Goal: home PT Goal Formulation: With patient Time For Goal Achievement: 11/03/13 Potential to Achieve Goals: Good  Visit Information  Last PT Received On: 10/20/13 Assistance Needed: +1 History of Present Illness: Pt admitted for acute resp failure/COPD exacerbation.       Prior [place] expects to be discharged to:: Private residence Living Arrangements: Other relatives Available Help at Discharge: Family;Available PRN/intermittently Type of Home: House Home Access: Stairs to enter [employer] of Steps: 1 Home Layout: One level Home Equipment: Other (comment) (home O2) Prior Function Level of Independence: Independent Communication Communication: No difficulties    Cognition  Cognition Arousal/Alertness: Awake/alert Behavior During Therapy: WFL for tasks assessed/performed Overall Cognitive Status: Within Functional Limits for tasks assessed    Extremity/Trunk Assessment     Balance    End of Session PT - End of Session Equipment Utilized During Treatment: Gait belt Activity Tolerance: Patient limited by fatigue;Treatment limited secondary to medical complications (Comment) (DOE) Patient left: in chair;with call bell/phone within reach Nurse Communication: Mobility status  GP     Lorriane Shire 10/20/2013, 12:35 PM  Lorrin Goodell, PT  Office # [phone] Pager [phone]

## 2013-10-20 NOTE — Progress Notes (Signed)
PHARMACY - RENAL ADJUSTMENT FOR ANTIBIOTICS  Patient is on ceftriaxone and azithromycin for CAP. These antibiotics do not need to be adjusted for renal function.  Continue ceftriaxone 1 g IV q24h Continue azithromycin 500 mg IV q24h Pharmacy signing off, please re-consult if needed  [facility], [place].D., BCPS Clinical Pharmacist Pager: [phone] 10/20/2013 11:01 AM

## 2013-10-20 NOTE — ED Notes (Signed)
Floor states she wont accept pt upstairs until rapid response assess patient.

## 2013-10-20 NOTE — Progress Notes (Signed)
PATIENT DETAILS Name: [name] Age: 63 y.o. Sex: female Date of Birth: [birth year] Admit Date: 10/19/2013 Admitting Physician Robbie Lis, MD IRW:ERXV, CAMMIE, MD  Subjective: Comfortable this morning. Shortness of breath and cough better.  Assessment/Plan: Principal Problem:   Acute respiratory failure with hypoxia - Secondary to COPD with exacerbation and pneumonia. - Much better with IV antibiotics, steroids and nebulized bronchodilators. - Taper off oxygen  Community acquired pneumonia - Patient was admitted, and started on intravenous Rocephin and Zithromax. She has done well and has rapidly improved. No fever or leukocytosis noted. Will be transitioned to Levaquin on discharge. - For now continue with Rocephin and Zithromax-day 2  COPD with acute exacerbation - As rapidly improved, was initially started on IV Solu-Medrol, she will be transitioned to prednisone which will be tapered over a week. Continue with nebulized bronchodilators for now.    HYPERLIPIDEMIA - Continue with statins    HYPERTENSION - Stable, continue with atenolol    Hypothyroidism - Continue with levothyroxine  Disposition: Remain inpatient-home in in a.m. if clinical improvement continues  DVT Prophylaxis: Prophylactic Lovenox   Code Status: Full code   Family Communication None at bedside  Procedures:  None  CONSULTS:  None  Time spent 40 minutes-which includes 50% of the time with face-to-face with patient/ family and coordinating care related to the above assessment and plan.    MEDICATIONS: Scheduled Meds: . antiseptic oral rinse  15 mL Mouth Rinse BID  . atenolol  25 mg Oral Daily  . azithromycin  500 mg Intravenous Q24H  . cefTRIAXone (ROCEPHIN)  IV  1 g Intravenous Q24H  . guaiFENesin  600 mg Oral BID  . ipratropium-albuterol  3 mL Nebulization Q6H  . levothyroxine  50 mcg Oral QAC breakfast  . multivitamin with minerals  1 tablet Oral Daily  .  omega-3 acid ethyl esters  1 g Oral Daily  . predniSONE  50 mg Oral Q breakfast  . simvastatin  10 mg Oral q1800   Continuous Infusions: . sodium chloride 75 mL/hr at 10/20/13 0216   PRN Meds:.acetaminophen, acetaminophen, albuterol, HYDROcodone-acetaminophen, ipratropium, ondansetron (ZOFRAN) IV, ondansetron  Antibiotics: Anti-infectives   Start     Dose/Rate Route Frequency Ordered Stop   10/20/13 0000  cefTRIAXone (ROCEPHIN) 1 g in dextrose 5 % 50 mL IVPB     1 g 100 mL/hr over 30 Minutes Intravenous Every 24 hours 10/19/13 2349 10/26/13 2359   10/20/13 0000  azithromycin (ZITHROMAX) 500 mg in dextrose 5 % 250 mL IVPB     500 mg 250 mL/hr over 60 Minutes Intravenous Every 24 hours 10/19/13 2349 10/26/13 2359   10/19/13 2200  cefTRIAXone (ROCEPHIN) 1 g in dextrose 5 % 50 mL IVPB     1 g 100 mL/hr over 30 Minutes Intravenous  Once 10/19/13 2155 10/19/13 2327   10/19/13 2200  azithromycin (ZITHROMAX) tablet 500 mg     500 mg Oral  Once 10/19/13 2155 10/19/13 2205       PHYSICAL EXAM: Vital signs in last 24 hours: Filed Vitals:   10/20/13 0137 10/20/13 0542 10/20/13 0944 10/20/13 1017  BP: 131/64 99/61  109/59  Pulse: 126 110  102  Temp: 97.4 F (36.3 C) 97.9 F (36.6 C)    TempSrc: Oral Oral    Resp: 18 18  18   Height: 5' (1.524 m)     Weight: 44.407 kg (97 lb 14.4 oz) 44.407 kg (97 lb 14.4 oz)    SpO2: 92% 95%  96%     Weight change:  Filed Weights   10/20/13 0137 10/20/13 0542  Weight: 44.407 kg (97 lb 14.4 oz) 44.407 kg (97 lb 14.4 oz)   Body mass index is 19.12 kg/(m^2).   Gen Exam: Awake and alert with clear speech.   Neck: Supple, No JVD.   Chest: B/L Clear- With the exception of a few scattered rhonchi CVS: S1 S2 Regular, no murmurs.  Abdomen: soft, BS +, non tender, non distended.  Extremities: no edema, lower extremities warm to touch. Neurologic: Non Focal.   Skin: No Rash.   Wounds: N/A.    Intake/Output from previous day:  Intake/Output Summary  (Last 24 hours) at 10/20/13 1032 Last data filed at 10/20/13 0700  Gross per 24 hour  Intake    355 ml  Output      0 ml  Net    355 ml     LAB RESULTS: CBC  Recent Labs Lab 10/19/13 1810 10/19/13 2210 10/20/13 0620  WBC 8.4 8.2 7.3  HGB 11.3* 11.3* 10.3*  HCT 37.3* 33.1* 30.7*  PLT  --  279 256  MCV 95.1 89.0 89.0  MCH 28.8 30.4 29.9  MCHC 30.3* 34.1 33.6  RDW  --  12.7 13.0  LYMPHSABS  --  3.6  --   MONOABS  --  0.8  --   EOSABS  --  0.1  --   BASOSABS  --  0.0  --     Chemistries   Recent Labs Lab 10/19/13 2210 10/20/13 0620  NA 140 137  K 3.8 4.0  CL 98 96  CO2 26 25  GLUCOSE 128* 314*  BUN 4* 8  CREATININE 0.42* 0.43*  CALCIUM 9.9 9.3    CBG:  Recent Labs Lab 10/20/13 0815  GLUCAP 263*    GFR Estimated Creatinine Clearance: 49.8 ml/min (by C-G formula based on Cr of 0.43).  Coagulation profile No results found for this basename: INR, PROTIME,  in the last 168 hours  Cardiac Enzymes No results found for this basename: CK, CKMB, TROPONINI, MYOGLOBIN,  in the last 168 hours  No components found with this basename: POCBNP,  No results found for this basename: DDIMER,  in the last 72 hours No results found for this basename: HGBA1C,  in the last 72 hours No results found for this basename: CHOL, HDL, LDLCALC, TRIG, CHOLHDL, LDLDIRECT,  in the last 72 hours No results found for this basename: TSH, T4TOTAL, FREET3, T3FREE, THYROIDAB,  in the last 72 hours No results found for this basename: VITAMINB12, FOLATE, FERRITIN, TIBC, IRON, RETICCTPCT,  in the last 72 hours No results found for this basename: LIPASE, AMYLASE,  in the last 72 hours  Urine Studies No results found for this basename: UACOL, UAPR, USPG, UPH, UTP, UGL, UKET, UBIL, UHGB, UNIT, UROB, ULEU, UEPI, UWBC, URBC, UBAC, CAST, CRYS, UCOM, BILUA,  in the last 72 hours  MICROBIOLOGY: No results found for this or any previous visit (from the past 240 hour(s)).  RADIOLOGY  STUDIES/RESULTS: Dg Chest 2 View  10/19/2013   CLINICAL DATA:  Shortness of breath, COPD  EXAM: CHEST  2 VIEW  COMPARISON:  11/24/2011  FINDINGS: Hyperinflation/emphysematous changes. Stable mild scarring in the right mid lung.  On the lateral view, there is mild patchy opacity in the posterior costophrenic sulcus, possibly reflecting pneumonia.  Heart is normal in size.  Visualized osseous structures are within normal limits.  IMPRESSION: Mild patchy lower lobe opacity, best visualized on the lateral  view, possibly reflecting pneumonia.  These results were called by telephone at the time of interpretation on 10/19/2013 at 9:08 PM to Dr. Kingsley Spittle, who verbally acknowledged these results.   Electronically Signed   By: Julian Hy M.D.   On: 10/19/2013 21:12    Oren Binet, MD  [facility] Pager:[phone]  If 7PM-7AM, please contact night-coverage [email or web address] Password [email or web address] 10/20/2013, 10:32 AM   LOS: 1 day

## 2013-10-20 NOTE — Progress Notes (Signed)
Utilization review completed.  

## 2013-10-21 DIAGNOSIS — E785 Hyperlipidemia, unspecified: Secondary | ICD-10-CM | POA: Diagnosis not present

## 2013-10-21 DIAGNOSIS — J189 Pneumonia, unspecified organism: Secondary | ICD-10-CM | POA: Diagnosis not present

## 2013-10-21 DIAGNOSIS — J9600 Acute respiratory failure, unspecified whether with hypoxia or hypercapnia: Secondary | ICD-10-CM | POA: Diagnosis not present

## 2013-10-21 DIAGNOSIS — E039 Hypothyroidism, unspecified: Secondary | ICD-10-CM | POA: Diagnosis not present

## 2013-10-21 LAB — URINE CULTURE
CULTURE: NO GROWTH
Colony Count: NO GROWTH

## 2013-10-21 LAB — LEGIONELLA ANTIGEN, URINE: LEGIONELLA ANTIGEN, URINE: NEGATIVE

## 2013-10-21 LAB — GLUCOSE, CAPILLARY: Glucose-Capillary: 130 mg/dL — ABNORMAL HIGH (ref 70–99)

## 2013-10-21 MED ORDER — GUAIFENESIN ER 600 MG PO TB12: 600.0000 mg | ORAL_TABLET | Freq: Two times a day (BID) | ORAL | Status: DC

## 2013-10-21 MED ORDER — PREDNISONE 10 MG PO TABS: ORAL_TABLET | ORAL | Status: DC

## 2013-10-21 MED ORDER — LEVOFLOXACIN 750 MG PO TABS: 750.0000 mg | ORAL_TABLET | Freq: Every day | ORAL | Status: DC

## 2013-10-21 MED ORDER — ALBUTEROL SULFATE (2.5 MG/3ML) 0.083% IN NEBU: 2.5000 mg | INHALATION_SOLUTION | RESPIRATORY_TRACT | Status: DC | PRN

## 2013-10-21 MED ORDER — ALBUTEROL SULFATE HFA 108 (90 BASE) MCG/ACT IN AERS: 2.0000 | INHALATION_SPRAY | RESPIRATORY_TRACT | Status: DC | PRN

## 2013-10-21 MED ORDER — LEVOFLOXACIN 750 MG PO TABS
750.0000 mg | ORAL_TABLET | Freq: Every day | ORAL | Status: DC
  Administered 2013-10-21: 750 mg via ORAL
  Filled 2013-10-21 (×2): qty 1

## 2013-10-21 NOTE — Progress Notes (Signed)
Pt to discharge home with sister.  IV removed; site clean, dry, and intact.  Catheter tip patent.  Patient complains of no further pain or other symptoms.  Discharge instructions given; patient verbalized understanding.

## 2013-10-21 NOTE — Progress Notes (Signed)
OT Cancellation Note and Discharge  Patient Details Name: [name] MRN: [id number] DOB: 1951-07-12   Cancelled Treatment:    Reason Eval/Treat Not Completed: OT screened, no needs identified, will sign off. Pt reports that she does not feet there will be any issues with BADLs, that she bathed herself yesterday after they set her up and that she walked to the bathroom by herself this AM. She does report that she needs to use her oxygen more readily at home. Acute OT will sign off.  Almon Register [id number] 10/21/2013, 9:27 AM

## 2013-10-21 NOTE — Discharge Summary (Addendum)
PATIENT DETAILS Name: [name] Age: 63 y.o. Sex: female Date of Birth: 1951-04-13 MRN: [id number]. Admit Date: 10/19/2013 Admitting Physician: Robbie Lis, MD PJA:SNKN, CAMMIE, MD  Recommendations for Outpatient Follow-up:  1. Gen. health maintenance 2. PCP check chest x-ray in 6-8 weeks to document resolution of the pneumonia 3. Urine culture pending at the time of discharge, please follow at followup  PRIMARY DISCHARGE DIAGNOSIS:  Principal Problem:   Acute respiratory failure with hypoxia Active Problems:   HYPERLIPIDEMIA   HYPERTENSION   CAP (community acquired pneumonia)   COPD (chronic obstructive pulmonary disease) with exacerbation   Hypothyroidism      PAST MEDICAL HISTORY: Past Medical History  Diagnosis Date  . COPD (chronic obstructive pulmonary disease)   . Chronic bronchitis   . Hypothyroidism   . Hyperlipidemia   . Hypertension   . Post-menopausal     DISCHARGE MEDICATIONS:   Medication List         albuterol 108 (90 BASE) MCG/ACT inhaler  Commonly known as:  PROVENTIL HFA;VENTOLIN HFA  Inhale 2 puffs into the lungs every 4 (four) hours as needed for wheezing or shortness of breath.     albuterol (2.5 MG/3ML) 0.083% nebulizer solution  Commonly known as:  PROVENTIL  Take 3 mLs (2.5 mg total) by nebulization every 2 (two) hours as needed for wheezing or shortness of breath.     atenolol 25 MG tablet  Commonly known as:  TENORMIN  Take 25 mg by mouth daily.     fish oil-omega-3 fatty acids 1000 MG capsule  Take 1 g by mouth 2 (two) times daily.     Fluticasone-Salmeterol 250-50 MCG/DOSE Aepb  Commonly known as:  ADVAIR  Inhale 1 puff into the lungs every 12 (twelve) hours.     guaiFENesin 600 MG 12 hr tablet  Commonly known as:  MUCINEX  Take 1 tablet (600 mg total) by mouth 2 (two) times daily.     levofloxacin 750 MG tablet  Commonly known as:  LEVAQUIN  Take 1 tablet (750 mg total) by mouth daily.     levothyroxine 50 MCG  tablet  Commonly known as:  SYNTHROID, LEVOTHROID  Take 50 mcg by mouth daily.     lovastatin 20 MG tablet  Commonly known as:  MEVACOR  Take 20 mg by mouth at bedtime.     multivitamin with minerals Tabs tablet  Take 1 tablet by mouth daily.     predniSONE 10 MG tablet  Commonly known as:  DELTASONE  - Take 4 tablets (40 milligrams) daily for 2 days,then  - Take 3 tablets (30 milligrams) daily for 2 days,then,  - Take 2 tablets (20 milligrams) daily for 2 days,then,  - Take 1 tablets (10 milligrams) daily for 1 day,then stop     tiotropium 18 MCG inhalation capsule  Commonly known as:  SPIRIVA  Place 1 capsule (18 mcg total) into inhaler and inhale daily.     VITAMIN B-12 PO  Take 1 tablet by mouth daily.        ALLERGIES:  No Known Allergies  BRIEF HPI:  See H&P, Labs, Consult and Test reports for all details in brief, patient is a 63 year old female past medical history of chronic respiratory failure on home O2, COPD who presented to the ED with worsening shortness of breath and cough. Patient was found to have pneumonia and admitted to the hospitalist service for further evaluation and treatment  CONSULTATIONS:   None  PERTINENT RADIOLOGIC STUDIES: Dg  Chest 2 View  10/19/2013   CLINICAL DATA:  Shortness of breath, COPD  EXAM: CHEST  2 VIEW  COMPARISON:  11/24/2011  FINDINGS: Hyperinflation/emphysematous changes. Stable mild scarring in the right mid lung.  On the lateral view, there is mild patchy opacity in the posterior costophrenic sulcus, possibly reflecting pneumonia.  Heart is normal in size.  Visualized osseous structures are within normal limits.  IMPRESSION: Mild patchy lower lobe opacity, best visualized on the lateral view, possibly reflecting pneumonia.  These results were called by telephone at the time of interpretation on 10/19/2013 at 9:08 PM to Dr. Kingsley Spittle, who verbally acknowledged these results.   Electronically Signed   By: Julian Hy M.D.    On: 10/19/2013 21:12     PERTINENT LAB RESULTS: CBC:  Recent Labs  10/19/13 2210 10/20/13 0620  WBC 8.2 7.3  HGB 11.3* 10.3*  HCT 33.1* 30.7*  PLT 279 256   CMET CMP     Component Value Date/Time   NA 137 10/20/2013 0620   K 4.0 10/20/2013 0620   CL 96 10/20/2013 0620   CO2 25 10/20/2013 0620   GLUCOSE 314* 10/20/2013 0620   BUN 8 10/20/2013 0620   CREATININE 0.43* 10/20/2013 0620   CALCIUM 9.3 10/20/2013 0620   PROT 7.3 10/20/2013 0620   ALBUMIN 3.2* 10/20/2013 0620   AST 24 10/20/2013 0620   ALT 39* 10/20/2013 0620   ALKPHOS 76 10/20/2013 0620   BILITOT <0.2* 10/20/2013 0620   GFRNONAA >90 10/20/2013 0620   GFRAA >90 10/20/2013 0620    GFR Estimated Creatinine Clearance: 50.5 ml/min (by C-G formula based on Cr of 0.43). No results found for this basename: LIPASE, AMYLASE,  in the last 72 hours No results found for this basename: CKTOTAL, CKMB, CKMBINDEX, TROPONINI,  in the last 72 hours No components found with this basename: POCBNP,  No results found for this basename: DDIMER,  in the last 72 hours No results found for this basename: HGBA1C,  in the last 72 hours No results found for this basename: CHOL, HDL, LDLCALC, TRIG, CHOLHDL, LDLDIRECT,  in the last 72 hours No results found for this basename: TSH, T4TOTAL, FREET3, T3FREE, THYROIDAB,  in the last 72 hours No results found for this basename: VITAMINB12, FOLATE, FERRITIN, TIBC, IRON, RETICCTPCT,  in the last 72 hours Coags: No results found for this basename: PT, INR,  in the last 72 hours Microbiology: No results found for this or any previous visit (from the past 240 hour(s)).   BRIEF HOSPITAL COURSE:  Acute on chronic respiratory failure with hypoxia  - Secondary to COPD with exacerbation and pneumonia.  - Much better with IV antibiotics, steroids and nebulized bronchodilators.  - Back on 2 L of oxygen-which is her baseline  Community acquired pneumonia  - Patient was admitted, and started on intravenous  Rocephin and Zithromax. She has done well and has rapidly improved. No fever or leukocytosis noted. Will be transitioned to Levaquin on discharge.  - Has completed 3 days of Rocephin and Zithromax, will transitioned to Levaquin for another 4 more days to complete a one-week course of antibiotics   COPD with acute exacerbation  - As rapidly improved, was initially started on IV Solu-Medrol, she will be transitioned to prednisone which will be tapered over a week. Lungs are completely clear the time of discharge. Continue with her usual inhalers, added as needed albuterol nebulizer.  HYPERLIPIDEMIA  - Continue with statins   HYPERTENSION  - Stable, continue with  atenolol   Hypothyroidism  - Continue with levothyroxine  TODAY-DAY OF DISCHARGE:  Subjective:   Floy Sabina today has no headache,no chest abdominal pain,no new weakness tingling or numbness, feels much better wants to go home today.   Objective:   Blood pressure 145/74, pulse 105, temperature 98.3 F (36.8 C), temperature source Oral, resp. rate 18, height 5' (1.524 m), weight 45 kg (99 lb 3.3 oz), SpO2 99.00%.  Intake/Output Summary (Last 24 hours) at 10/21/13 1055 Last data filed at 10/20/13 1320  Gross per 24 hour  Intake      0 ml  Output    200 ml  Net   -200 ml   Filed Weights   10/20/13 0137 10/20/13 0542 10/21/13 0500  Weight: 44.407 kg (97 lb 14.4 oz) 44.407 kg (97 lb 14.4 oz) 45 kg (99 lb 3.3 oz)    Exam Awake Alert, Oriented *3, No new F.N deficits, Normal affect [place].AT,PERRAL Supple Neck,No JVD, No cervical lymphadenopathy appriciated.  Symmetrical Chest wall movement, Good air movement bilaterally, CTAB RRR,No Gallops,Rubs or new Murmurs, No Parasternal Heave +ve B.Sounds, Abd Soft, Non tender, No organomegaly appriciated, No rebound -guarding or rigidity. No Cyanosis, Clubbing or edema, No new Rash or bruise  DISCHARGE CONDITION: Stable  DISPOSITION: Home  DISCHARGE INSTRUCTIONS:     Activity:  As tolerated   Diet recommendation: Heart Healthy diet  Discharge Orders   Future Orders Complete By Expires   Call MD for:  difficulty breathing, headache or visual disturbances  As directed    Call MD for:  temperature >100.4  As directed    Diet - low sodium heart healthy  As directed    Increase activity slowly  As directed      Follow-up Information   Follow up with FULP, CAMMIE, MD. Schedule an appointment as soon as possible for a visit in 1 week.   Specialty:  Family Medicine   Contact information:   [place] [address] [phone]     Total Time spent on discharge equals 45 minutes.  SignedOren Binet 10/21/2013 10:55 AM

## 2013-10-21 NOTE — Care Management Note (Signed)
    Page 1 of 1   10/21/2013     5:23:21 PM   CARE MANAGEMENT NOTE 10/21/2013  Patient:  [name], [name]   Account Number:  [id number]  Date Initiated:  10/21/2013  Documentation initiated by:  Tomi Bamberger  Subjective/Objective Assessment:   dx acute resp failure  admit- lives with sister.     Action/Plan:   Anticipated DC Date:  10/21/2013   Anticipated DC Plan:  [place]  CM consult      PAC Choice  DURABLE MEDICAL EQUIPMENT   Choice offered to / List presented to:     DME arranged  NEBULIZER MACHINE      DME agency  [facility].        Status of service:  Completed, signed off Medicare Important Message given?   (If response is "NO", the following Medicare IM given date fields will be blank) Date Medicare IM given:   Date Additional Medicare IM given:    Discharge Disposition:  HOME/SELF CARE  Per UR Regulation:  Reviewed for med. necessity/level of care/duration of stay  If discussed at [place] of Stay Meetings, dates discussed:    Comments:  10/21/13 17:22 Tomi Bamberger RN, BSN [phone] patient lives with sister, pt for dc today, nebulizer machine ordered and [facility] will deliver to patient 's home. phone is [phone].

## 2013-10-21 NOTE — Progress Notes (Signed)
Nutrition Brief Note  RD consulted for Assessment via COPD GOLD Protocol Order Set.  Discussed pt during progression rounds, pt to d/c today.   Pt reports slight weight loss with decreased appetite PTA. Now eating well, consumed at least 75% of her breakfast this morning. This RD reviewed her diet and provided suggestions on increasing kcal/protein intake in order to promote weight gain. RD provided "COPD Nutrition Therapy" handout from [employer].  Body mass index is 19.38 kg/(m^2). Patient meets criteria for normal weight based on current BMI.   Current diet order is Heart Healthy, patient is consuming approximately 75% of meals at this time. Labs and medications reviewed.   No nutrition interventions warranted at this time. If nutrition issues arise, please consult RD.   Inda Coke MS, RD, LDN Pager: [phone] After-hours pager: [phone]

## 2013-11-01 DIAGNOSIS — D649 Anemia, unspecified: Secondary | ICD-10-CM | POA: Diagnosis not present

## 2013-11-01 DIAGNOSIS — R7309 Other abnormal glucose: Secondary | ICD-10-CM | POA: Diagnosis not present

## 2013-11-01 DIAGNOSIS — Z79899 Other long term (current) drug therapy: Secondary | ICD-10-CM | POA: Diagnosis not present

## 2013-11-01 DIAGNOSIS — E039 Hypothyroidism, unspecified: Secondary | ICD-10-CM | POA: Diagnosis not present

## 2013-11-01 DIAGNOSIS — J4489 Other specified chronic obstructive pulmonary disease: Secondary | ICD-10-CM | POA: Diagnosis not present

## 2013-11-01 DIAGNOSIS — J449 Chronic obstructive pulmonary disease, unspecified: Secondary | ICD-10-CM | POA: Diagnosis not present

## 2014-04-28 ENCOUNTER — Telehealth: Payer: Self-pay | Admitting: Pulmonary Disease

## 2014-04-28 NOTE — Telephone Encounter (Signed)
lmtcb for crystal with [facility] pt. Alida is not in the office today.   Mindy have you come across this?

## 2014-04-28 NOTE — Telephone Encounter (Signed)
Alida handles CMN's. I have not. thanks

## 2014-05-05 NOTE — Telephone Encounter (Signed)
Please advise Alida if you have received a CMN for this patient. thanks

## 2014-05-07 NOTE — Telephone Encounter (Signed)
Patient has not been seen since 12/11/12 per Dr Elsworth Soho pt needs an office visit, and this recomendation has been faxed to [facility]. 2 times already. 2nd faxed today. [name]

## 2014-06-20 ENCOUNTER — Other Ambulatory Visit: Payer: Self-pay | Admitting: Family Medicine

## 2014-06-20 DIAGNOSIS — I10 Essential (primary) hypertension: Secondary | ICD-10-CM | POA: Diagnosis not present

## 2014-06-20 DIAGNOSIS — Z23 Encounter for immunization: Secondary | ICD-10-CM | POA: Diagnosis not present

## 2014-06-20 DIAGNOSIS — Z1231 Encounter for screening mammogram for malignant neoplasm of breast: Secondary | ICD-10-CM

## 2014-06-20 DIAGNOSIS — J069 Acute upper respiratory infection, unspecified: Secondary | ICD-10-CM | POA: Diagnosis not present

## 2014-06-20 DIAGNOSIS — R7309 Other abnormal glucose: Secondary | ICD-10-CM | POA: Diagnosis not present

## 2014-06-20 DIAGNOSIS — E2839 Other primary ovarian failure: Secondary | ICD-10-CM

## 2014-06-20 DIAGNOSIS — J449 Chronic obstructive pulmonary disease, unspecified: Secondary | ICD-10-CM | POA: Diagnosis not present

## 2014-06-20 DIAGNOSIS — E785 Hyperlipidemia, unspecified: Secondary | ICD-10-CM | POA: Diagnosis not present

## 2014-06-20 DIAGNOSIS — J4489 Other specified chronic obstructive pulmonary disease: Secondary | ICD-10-CM | POA: Diagnosis not present

## 2014-06-20 DIAGNOSIS — Z79899 Other long term (current) drug therapy: Secondary | ICD-10-CM | POA: Diagnosis not present

## 2014-06-20 DIAGNOSIS — E039 Hypothyroidism, unspecified: Secondary | ICD-10-CM | POA: Diagnosis not present

## 2014-07-04 ENCOUNTER — Ambulatory Visit: Payer: Medicare Other

## 2014-07-04 ENCOUNTER — Other Ambulatory Visit: Payer: Medicare Other

## 2014-07-09 ENCOUNTER — Ambulatory Visit
Admission: RE | Admit: 2014-07-09 | Discharge: 2014-07-09 | Disposition: A | Discharge status: Home / Self Care | Payer: Medicare Other | Source: Ambulatory Visit | Attending: Family Medicine | Admitting: Family Medicine

## 2014-07-09 DIAGNOSIS — Z1231 Encounter for screening mammogram for malignant neoplasm of breast: Secondary | ICD-10-CM | POA: Diagnosis not present

## 2014-07-09 DIAGNOSIS — M810 Age-related osteoporosis without current pathological fracture: Secondary | ICD-10-CM | POA: Diagnosis not present

## 2014-07-09 DIAGNOSIS — E2839 Other primary ovarian failure: Secondary | ICD-10-CM

## 2014-07-17 ENCOUNTER — Encounter: Payer: Self-pay | Admitting: Pulmonary Disease

## 2014-07-17 ENCOUNTER — Ambulatory Visit (INDEPENDENT_AMBULATORY_CARE_PROVIDER_SITE_OTHER): Payer: Medicare Other | Admitting: Pulmonary Disease

## 2014-07-17 ENCOUNTER — Ambulatory Visit (HOSPITAL_BASED_OUTPATIENT_CLINIC_OR_DEPARTMENT_OTHER)
Admission: RE | Admit: 2014-07-17 | Discharge: 2014-07-17 | Disposition: A | Discharge status: Home / Self Care | Payer: Medicare Other | Source: Ambulatory Visit | Attending: Pulmonary Disease | Admitting: Pulmonary Disease

## 2014-07-17 VITALS — BP 151/72 | HR 78 | Temp 97.6°F | Ht 60.0 in | Wt 95.1 lb

## 2014-07-17 DIAGNOSIS — J449 Chronic obstructive pulmonary disease, unspecified: Secondary | ICD-10-CM | POA: Diagnosis not present

## 2014-07-17 DIAGNOSIS — Z23 Encounter for immunization: Secondary | ICD-10-CM

## 2014-07-17 DIAGNOSIS — I7090 Unspecified atherosclerosis: Secondary | ICD-10-CM | POA: Diagnosis not present

## 2014-07-17 DIAGNOSIS — J439 Emphysema, unspecified: Secondary | ICD-10-CM | POA: Insufficient documentation

## 2014-07-17 DIAGNOSIS — R0602 Shortness of breath: Secondary | ICD-10-CM | POA: Diagnosis present

## 2014-07-17 NOTE — Patient Instructions (Addendum)
CXR today Ambulatory satn We will place request for portable oxygen concentrator Stay on advair & spiriva Prevnar vaccine

## 2014-07-17 NOTE — Progress Notes (Signed)
   Subjective:    [name], female    DOB: 14-Oct-1950, 63 y.o.   MRN: [id number]  HPI  11/24/2011 - Initial oV  PCP - [facility] [place]   63/F, ex smoker with severe COPD for FU.   Significant tests/ events  She was seen by Dr Gwenette Greet 12/24/08 after hospitalization for LUL pna. CT angio 3/10 showed centrilobular emphysema.She was placed on oxygen around this time [facility] pt) .  PFTs  12/2008 FEv1 39%, DLCO 35%, mild air trapping    07/17/2014  Chief Complaint  Patient presents with  . Emphysema    follow-up. Pt states SOB is not any worse but family member states SOB is worse with activity.    Here for recertification of O2   feels portable tank is too heavy & hurts her shoulder  CMN received from [place] pt. She describes a less than one block doe at a moderate pace on flat ground. She will get winded bringing groceries in from the car. She can climb a flight of stairs at her own pace.  She is compliant with nocturnal O2.She reports a 'smoker's cough' with minimal sticky phlegm  She desaturated to 76% on1 lap on RA, required 3L o2 to maintain satn Dropped wt from 100  to 95 lbs over 1 yr  Review of Systems neg for any significant sore throat, dysphagia, itching, sneezing, nasal congestion or excess/ purulent secretions, fever, chills, sweats, unintended wt loss, pleuritic or exertional cp, hempoptysis, orthopnea pnd or change in chronic leg swelling. Also denies presyncope, palpitations, heartburn, abdominal pain, nausea, vomiting, diarrhea or change in bowel or urinary habits, dysuria,hematuria, rash, arthralgias, visual complaints, headache, numbness weakness or ataxia.     Objective:   Physical Exam  Gen. Pleasant, thin woman, in no distress ENT - no lesions, no post nasal drip Neck: No JVD, no thyromegaly, no carotid bruits Lungs: no use of accessory muscles, no dullness to percussion, decreased without rales or rhonchi  Cardiovascular: Rhythm  regular, heart sounds  normal, no murmurs or gallops, no peripheral edema Musculoskeletal: No deformities, no cyanosis or clubbing        Assessment & Plan:

## 2014-07-17 NOTE — Assessment & Plan Note (Signed)
CXR today We will place request for portable oxygen concentrator -3L O2 Stay on advair & spiriva Prevnar vaccine Rpt spirometry next visit

## 2014-07-21 ENCOUNTER — Other Ambulatory Visit: Payer: Self-pay | Admitting: Family Medicine

## 2014-07-21 DIAGNOSIS — R928 Other abnormal and inconclusive findings on diagnostic imaging of breast: Secondary | ICD-10-CM

## 2014-08-20 ENCOUNTER — Ambulatory Visit
Admission: RE | Admit: 2014-08-20 | Discharge: 2014-08-20 | Disposition: A | Discharge status: Home / Self Care | Payer: Medicare Other | Source: Ambulatory Visit | Attending: Family Medicine | Admitting: Family Medicine

## 2014-08-20 DIAGNOSIS — N6002 Solitary cyst of left breast: Secondary | ICD-10-CM | POA: Diagnosis not present

## 2014-08-20 DIAGNOSIS — N63 Unspecified lump in breast: Secondary | ICD-10-CM | POA: Diagnosis not present

## 2014-08-20 DIAGNOSIS — R928 Other abnormal and inconclusive findings on diagnostic imaging of breast: Secondary | ICD-10-CM

## 2014-10-21 DIAGNOSIS — R636 Underweight: Secondary | ICD-10-CM | POA: Diagnosis not present

## 2014-10-21 DIAGNOSIS — G4700 Insomnia, unspecified: Secondary | ICD-10-CM | POA: Diagnosis not present

## 2014-10-21 DIAGNOSIS — M810 Age-related osteoporosis without current pathological fracture: Secondary | ICD-10-CM | POA: Diagnosis not present

## 2014-10-21 DIAGNOSIS — E785 Hyperlipidemia, unspecified: Secondary | ICD-10-CM | POA: Diagnosis not present

## 2014-10-21 DIAGNOSIS — J449 Chronic obstructive pulmonary disease, unspecified: Secondary | ICD-10-CM | POA: Diagnosis not present

## 2014-10-21 DIAGNOSIS — R7309 Other abnormal glucose: Secondary | ICD-10-CM | POA: Diagnosis not present

## 2014-10-21 DIAGNOSIS — I10 Essential (primary) hypertension: Secondary | ICD-10-CM | POA: Diagnosis not present

## 2014-10-21 DIAGNOSIS — E039 Hypothyroidism, unspecified: Secondary | ICD-10-CM | POA: Diagnosis not present

## 2015-01-02 ENCOUNTER — Telehealth: Payer: Self-pay | Admitting: Pulmonary Disease

## 2015-01-02 NOTE — Telephone Encounter (Addendum)
We can set her up with TP Spoke with the pt and scheduled appt with 01/19/15  Nothing further needed

## 2015-01-19 ENCOUNTER — Ambulatory Visit: Payer: Medicare Other | Admitting: Adult Health

## 2015-01-27 ENCOUNTER — Encounter (INDEPENDENT_AMBULATORY_CARE_PROVIDER_SITE_OTHER): Payer: Self-pay

## 2015-01-27 ENCOUNTER — Encounter: Payer: Self-pay | Admitting: Adult Health

## 2015-01-27 ENCOUNTER — Ambulatory Visit (INDEPENDENT_AMBULATORY_CARE_PROVIDER_SITE_OTHER): Payer: Medicare Other | Admitting: Adult Health

## 2015-01-27 VITALS — BP 110/60 | HR 70 | Temp 98.1°F | Ht 61.0 in | Wt 94.3 lb

## 2015-01-27 DIAGNOSIS — J449 Chronic obstructive pulmonary disease, unspecified: Secondary | ICD-10-CM | POA: Diagnosis not present

## 2015-01-27 DIAGNOSIS — J9611 Chronic respiratory failure with hypoxia: Secondary | ICD-10-CM | POA: Diagnosis not present

## 2015-01-27 NOTE — Progress Notes (Signed)
   Subjective:    [name], female    DOB: [birth year], 64 y.o.   MRN: [id number]  HPI 11/24/2011 - Initial oV  PCP - [facility] [place]   63/F, ex smoker with severe COPD for FU.   Significant tests/ events  She was seen by Dr Gwenette Greet 12/24/08 after hospitalization for LUL pna. CT angio 3/10 showed centrilobular emphysema.She was placed on oxygen around this time [facility] pt) .  PFTs  12/2008 FEv1 39%, DLCO 35%, mild air trapping    07/17/2014  Chief Complaint  Patient presents with  . Emphysema    follow-up. Pt states SOB is not any worse but family member states SOB is worse with activity.    Here for recertification of O2   feels portable tank is too heavy & hurts her shoulder  CMN received from [place] pt. She describes a less than one block doe at a moderate pace on flat ground. She will get winded bringing groceries in from the car. She can climb a flight of stairs at her own pace.  She is compliant with nocturnal O2.She reports a 'smoker's cough' with minimal sticky phlegm  She desaturated to 76% on1 lap on RA, required 3L o2 to maintain satn Dropped wt from 100  to 95 lbs over 1 yr   01/27/2015 Follow up COPD/ O2 dependent RF  Pt returns for 6 month follow up .  Overall says she is doing well on Advair and Spiriva  No flare in cough or dyspnea.  On O2 at 3 L .  Plans to go to [facility] to see family , discussed need for portable concentrator.  No ER /hospital admits.  PVX and Prevnar utd No chest pain, orthopnea, edema , hemoptysis, or fever.  Does have more nasal drainage with pollen . No discolored mucus.  Discussed claritin As needed  .    Review of Systems neg for any significant sore throat, dysphagia, itching, sneezing, nasal congestion or excess/ purulent secretions, fever, chills, sweats, unintended wt loss, pleuritic or exertional cp, hempoptysis, orthopnea pnd or change in chronic leg swelling. Also denies presyncope, palpitations,  heartburn, abdominal pain, nausea, vomiting, diarrhea or change in bowel or urinary habits, dysuria,hematuria, rash, arthralgias, visual complaints, headache, numbness weakness or ataxia.     Objective:   Physical Exam  Gen. Pleasant, thin woman, in no distress ENT - no lesions, no post nasal drip Neck: No JVD, no thyromegaly, no carotid bruits Lungs: no use of accessory muscles, no dullness to percussion, decreased without rales or rhonchi  Cardiovascular: Rhythm regular, heart sounds  normal, no murmurs or gallops, no peripheral edema Musculoskeletal: No deformities, no cyanosis or clubbing        Assessment & Plan:

## 2015-01-27 NOTE — Patient Instructions (Signed)
Stay on advair & spiriva If you fly to [place] will need portable concentrator  follow up Dr. Elsworth Soho  In 4-6 months and As needed

## 2015-01-28 DIAGNOSIS — J9611 Chronic respiratory failure with hypoxia: Secondary | ICD-10-CM | POA: Insufficient documentation

## 2015-01-28 NOTE — Assessment & Plan Note (Signed)
Doing well on O2 at 3lm Keep sats >90%.

## 2015-01-28 NOTE — Assessment & Plan Note (Signed)
Cont on current regimen  follow up Dr. Elsworth Soho  In 6 months

## 2015-01-28 NOTE — Progress Notes (Signed)
Reviewed & agree with plan  

## 2015-02-02 ENCOUNTER — Telehealth: Payer: Self-pay | Admitting: Pulmonary Disease

## 2015-02-02 NOTE — Telephone Encounter (Signed)
lmomtcb x1 

## 2015-02-03 ENCOUNTER — Telehealth: Payer: Self-pay | Admitting: Pulmonary Disease

## 2015-02-03 DIAGNOSIS — J449 Chronic obstructive pulmonary disease, unspecified: Secondary | ICD-10-CM

## 2015-02-03 NOTE — Telephone Encounter (Signed)
lmomtcb x 2  

## 2015-02-03 NOTE — Telephone Encounter (Signed)
Spoke with pt's daughter, she was asking if we had forms in our office to verify that pt's 02 was FAA approved.  I advised that while we prescribe 02 we do not administer home 02 and would have no way of knowing if pt's 02 tanks are FAA approved.  Pt's daughter asked if I could print off and fill out a form for pt for her 36 FAA approved tanks.  I advised that for [place] approval with the tanks that her best bet was to contact the DME company.  Nothing further needed at this time.

## 2015-02-03 NOTE — Telephone Encounter (Signed)
Spoke with pt's daughter after Caryl Pina had already spoken to her today. The pt is going to be flying on 02/21/15 to [place]. She will need a POC that is FAA approved. I again told the pt's daughter that they would need to contact the DME to find out what tanks she already has. The pt's daughter was very difficult to talk to, she didn't want to do any of this she wanted [place] to find out for her.  Advised her that I would call [facility] to find out what tank the pt has. According to [place] at [facility], the pt has a POC that is FAA approved.  Called pt's daughter again and advised her of this. She stated that the pt told her that she didn't have a POC because her insurance company wouldn't cover it. Advised her again what I was told by [facility]. Pt's daughter then proceeded to place me on hold to do a three way call with her mother. The pt told me that she in fact did not have a POC and that this would need to be ordered.  Order has been placed for this. [facility] are aware that they will need to get back with [place] and let us know the name of the POC. A form will also need to be filled out from [employer] for pt to fly with O2. The name of her POC will need to filled in on this form. This has been printed out and will be given to Janett Billow to have TP fill out.  Will route message to Janett Billow to follow up on.

## 2015-02-11 NOTE — Telephone Encounter (Signed)
Do we know the status of this? Thanks.

## 2015-02-12 NOTE — Telephone Encounter (Signed)
Called spoke with [facility] and was advised I had to call the travel dept at [phone]. I called and spoke with Miranda. Pt does not currently have a POC. They have reserved her a POC model "sequal eclipse 5".  She reports pt should be contacted within the next day or so.  I called and made daughter aware of the above. Pt is scheduled to leave on 02/20/15. Jess please advise once done thanks

## 2015-02-12 NOTE — Telephone Encounter (Signed)
Spoke with Rexford Maus, states that they would like the forms mailed to them.  Will send to Jess as FYI.

## 2015-02-12 NOTE — Telephone Encounter (Signed)
Apologies, have not been at my desk on 5/10 or 5/11 We still need to know the model of the POC pt will be carrying with her to [facility] will not accept without the specific model  Otherwise, there is no other issue

## 2015-02-12 NOTE — Telephone Encounter (Signed)
Keane Scrape - has this been taken care off?  Please advise.

## 2015-02-12 NOTE — Telephone Encounter (Signed)
Thanks Mindy Form filled out and signed by TP Copy made for scan and original ready to be mailed Would daughter still like the form mailed to the patient as originally stated or would she like someone to pick this up?  Thanks!

## 2015-02-12 NOTE — Telephone Encounter (Signed)
[phone], pt daughter cb

## 2015-02-13 NOTE — Telephone Encounter (Signed)
Have these forms been mailed out yet?  Please advise.  Thanks.

## 2015-02-16 NOTE — Telephone Encounter (Signed)
Sorry, yes.  The forms were mailed on 5.12.16 Thanks

## 2015-05-28 DIAGNOSIS — E785 Hyperlipidemia, unspecified: Secondary | ICD-10-CM | POA: Diagnosis not present

## 2015-05-28 DIAGNOSIS — R7309 Other abnormal glucose: Secondary | ICD-10-CM | POA: Diagnosis not present

## 2015-05-28 DIAGNOSIS — M810 Age-related osteoporosis without current pathological fracture: Secondary | ICD-10-CM | POA: Diagnosis not present

## 2015-05-28 DIAGNOSIS — I10 Essential (primary) hypertension: Secondary | ICD-10-CM | POA: Diagnosis not present

## 2015-05-28 DIAGNOSIS — E039 Hypothyroidism, unspecified: Secondary | ICD-10-CM | POA: Diagnosis not present

## 2015-05-28 DIAGNOSIS — Z23 Encounter for immunization: Secondary | ICD-10-CM | POA: Diagnosis not present

## 2015-05-28 DIAGNOSIS — Z79899 Other long term (current) drug therapy: Secondary | ICD-10-CM | POA: Diagnosis not present

## 2015-05-28 DIAGNOSIS — J449 Chronic obstructive pulmonary disease, unspecified: Secondary | ICD-10-CM | POA: Diagnosis not present

## 2015-05-29 ENCOUNTER — Emergency Department (INDEPENDENT_AMBULATORY_CARE_PROVIDER_SITE_OTHER): Payer: Medicare Other

## 2015-05-29 ENCOUNTER — Encounter (HOSPITAL_COMMUNITY): Payer: Self-pay | Admitting: *Deleted

## 2015-05-29 ENCOUNTER — Emergency Department (INDEPENDENT_AMBULATORY_CARE_PROVIDER_SITE_OTHER)
Admission: EM | Admit: 2015-05-29 | Discharge: 2015-05-29 | Disposition: A | Discharge status: Home / Self Care | Payer: Medicare Other | Source: Home / Self Care | Attending: Family Medicine | Admitting: Family Medicine

## 2015-05-29 DIAGNOSIS — J449 Chronic obstructive pulmonary disease, unspecified: Secondary | ICD-10-CM | POA: Diagnosis not present

## 2015-05-29 DIAGNOSIS — J441 Chronic obstructive pulmonary disease with (acute) exacerbation: Secondary | ICD-10-CM

## 2015-05-29 LAB — POCT URINALYSIS DIP (DEVICE)
BILIRUBIN URINE: NEGATIVE
Glucose, UA: NEGATIVE mg/dL
HGB URINE DIPSTICK: NEGATIVE
KETONES UR: 15 mg/dL — AB
Leukocytes, UA: NEGATIVE
Nitrite: NEGATIVE
PROTEIN: NEGATIVE mg/dL
Specific Gravity, Urine: 1.015 (ref 1.005–1.030)
Urobilinogen, UA: 0.2 mg/dL (ref 0.0–1.0)
pH: 7 (ref 5.0–8.0)

## 2015-05-29 MED ORDER — MELOXICAM 7.5 MG PO TABS: 7.5000 mg | ORAL_TABLET | Freq: Two times a day (BID) | ORAL | Status: DC
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
## 2015-05-29 NOTE — ED Notes (Signed)
Pt  Reports  Symptoms  Of  Chest  /  Back  Pain         With  Symptoms      Of  Chills      And      Pt  Also reports  Symptoms  Of a  Cough  As  Well        Pt   Sitting  upight on the  Exam table  Speaking  In   Complete  sentances       Skin is  Warm and  Dry    Cap refill is  Intact

## 2015-05-29 NOTE — Discharge Instructions (Signed)
Try medicine as prescribed and see your doctor if further problems.

## 2015-08-21 ENCOUNTER — Telehealth: Payer: Self-pay | Admitting: Pulmonary Disease

## 2015-08-21 NOTE — Telephone Encounter (Signed)
Patient calling to get a sooner appointment.  She is having a lot of sob, cough.  Has appointment with TP on 12/13, but thinks she needs to be seen sooner.  Scheduled patient to see MW on 11/21 at 11am.  Patient notified.  12/13 appt cancelled. Nothing further needed. Closing encounter

## 2015-08-24 ENCOUNTER — Encounter: Payer: Self-pay | Admitting: Internal Medicine

## 2015-08-24 ENCOUNTER — Ambulatory Visit (INDEPENDENT_AMBULATORY_CARE_PROVIDER_SITE_OTHER): Payer: Medicare Other | Admitting: Internal Medicine

## 2015-08-24 VITALS — BP 130/84 | HR 74 | Ht 61.0 in | Wt 90.6 lb

## 2015-08-24 DIAGNOSIS — J449 Chronic obstructive pulmonary disease, unspecified: Secondary | ICD-10-CM

## 2015-08-24 DIAGNOSIS — J9611 Chronic respiratory failure with hypoxia: Secondary | ICD-10-CM

## 2015-08-24 MED ORDER — TIOTROPIUM BROMIDE-OLODATEROL 2.5-2.5 MCG/ACT IN AERS: 2.0000 | INHALATION_SPRAY | Freq: Every day | RESPIRATORY_TRACT | Status: DC

## 2015-08-24 NOTE — Patient Instructions (Addendum)
Try stiolto 2 pffs each am in place advair/spiriva  Work on inhaler technique:  relax and gently blow all the way out then take a nice smooth deep breath back in, triggering the inhaler at same time you start breathing in.  Hold for up to 5 seconds if you can. Blow out thru nose. Rinse and gargle with water when done      Only use your albuterol as a rescue medication to be used if you can't catch your breath by resting or doing a relaxed purse lip breathing pattern.  - The less you use it, the better it will work when you need it. - Ok to use up to 2 puffs  every 4 hours if you must but call for immediate appointment if use goes up over your usual need - Don't leave home without it !!  (think of it like the spare tire for your car)    If insurance won't cover stiolto or you don't like it for any reason  return to see Tammy NP with your medication formulary in hand and she will offer you another choice but likely will need a different type of delivery system from the one we taught you today

## 2015-08-24 NOTE — Progress Notes (Signed)
Subjective:    [name], female    DOB: 1951/07/27   MRN: [id number]  HPI 11/24/2011 - Initial oV  PCP - Meriel Pica   63/F, ex smoker with severe COPD for FU.   Significant tests/ events  She was seen by Dr Gwenette Greet 12/24/08 after hospitalization for LUL pna. CT angio 3/10 showed centrilobular emphysema.She was placed on oxygen around this time [facility] pt) .  PFTs  12/2008 FEv1 39%, DLCO 35%, mild air trapping    07/17/2014  Chief Complaint  Patient presents with  . Emphysema    follow-up. Pt states SOB is not any worse but family member states SOB is worse with activity.    Here for recertification of O2   feels portable tank is too heavy & hurts her shoulder  CMN received from [facility] pt. She describes a less than one block doe at a moderate pace on flat ground. She will get winded bringing groceries in from the car. She can climb a flight of stairs at her own pace.  She is compliant with nocturnal O2.She reports a 'smoker's cough' with minimal sticky phlegm  She desaturated to 76% on1 lap on RA, required 3L o2 to maintain satn Dropped wt from 100  to 95 lbs over 1 yr   01/27/2015 Follow up COPD/ O2 dependent RF  Pt returns for 6 month follow up .  Overall says she is doing well on Advair and Spiriva  No flare in cough or dyspnea.  On O2 at 3 L .  Plans to go to [facility] to see family , discussed need for portable concentrator.  No ER /hospital admits.  PVX and Prevnar utd No chest pain, orthopnea, edema , hemoptysis, or fever.  Does have more nasal drainage with pollen . No discolored mucus.  Discussed claritin As needed   rec Stay on advair & spiriva If you fly to [place] will need portable concentrator   08/28/2015 acute extended ov/Ryman Rathgeber re: GOLD III copd/ on spiriva and freq saba and 02 2lpm hs/ 3lpm with activity  Chief Complaint  Patient presents with  . Acute Visit    Pt c/o increased SOB for the past few wks- "I have attacks"-  relates to the weather change. She takes albuterol inhaler 2 x per day on average and uses neb 1-2 x per day.   baseline doe = MMRC2 = can't walk a nl pace on a flat grade s sob   No obvious day to day or daytime variability or assoc purulent sputum  or cp or chest tightness, subjective wheeze or overt sinus or hb symptoms. No unusual exp hx or h/o childhood pna/ asthma or knowledge of premature birth.  Sleeping ok without nocturnal  or early am exacerbation  of respiratory  c/o's or need for noct saba. Also denies any obvious fluctuation of symptoms with weather or environmental changes or other aggravating or alleviating factors except as outlined above   Current Medications, Allergies, Complete Past Medical History, Past Surgical History, Family History, and Social History were reviewed in [employer] record.  ROS  The following are not active complaints unless bolded sore throat, dysphagia, dental problems, itching, sneezing,  nasal congestion or excess/ purulent secretions, ear ache,   fever, chills, sweats, unintended wt loss, classically pleuritic or exertional cp, hemoptysis,  orthopnea pnd or leg swelling, presyncope, palpitations, abdominal pain, anorexia, nausea, vomiting, diarrhea  or change in bowel or bladder habits, change in stools  or urine, dysuria,hematuria,  rash, arthralgias, visual complaints, headache, numbness, weakness or ataxia or problems with walking or coordination,  change in mood/affect or memory.                   Objective:   Physical Exam  Wt Readings from Last 3 Encounters:  08/24/15 90 lb 9.6 oz (41.096 kg)  01/27/15 94 lb 4.8 oz (42.774 kg)  07/17/14 95 lb 1.9 oz (43.146 kg)    Vital signs reviewed   Gen. Pleasant, thin woman, in no distress ENT - no lesions, no post nasal drip Neck: No JVD, no thyromegaly, no carotid bruits Lungs: no use of accessory muscles, no dullness to percussion, decreased without rales or rhonchi    Cardiovascular: Rhythm regular, heart sounds  normal, no murmurs or gallops, no peripheral edema Musculoskeletal: No deformities, no cyanosis or clubbing        Assessment & Plan:

## 2015-08-28 ENCOUNTER — Encounter: Payer: Self-pay | Admitting: Internal Medicine

## 2015-08-28 NOTE — Assessment & Plan Note (Signed)
Adequate control on present rx, reviewed > no change in rx needed  = 2lpm hs and 3lpm with exertion

## 2015-08-28 NOTE — Assessment & Plan Note (Signed)
12/2008 - FEv1 39%  She is still a Group B pt but requiring more and more saba so best option is LAMA/LABA combo  08/24/2015  extensive coaching HFA effectiveness =    90% with respimat   I had an extended discussion with the patient reviewing all relevant studies completed to date and  lasting 15 to 20 minutes of a 25 minute visit on the following ongoing concerns:   Formulary restrictions will be an ongoing challenge for the forseable future and I would be happy to pick an alternative if the pt will first  provide me a list of them but pt  will need to return here for training for any new device that is required eg dpi vs hfa vs respimat.    In meantime we can always provide samples so the patient never runs out of any needed respiratory medications.   Each maintenance medication was reviewed in detail including most importantly the difference between maintenance and as needed and under what circumstances the prns are to be used.  Please see instructions for details which were reviewed in writing and the patient given a copy.

## 2015-09-15 ENCOUNTER — Other Ambulatory Visit: Payer: Self-pay | Admitting: Internal Medicine

## 2015-09-15 ENCOUNTER — Ambulatory Visit: Payer: Medicare Other | Admitting: Adult Health

## 2015-09-15 NOTE — Telephone Encounter (Signed)
Patient states Dr. Melvyn Novas told her to get off of the Advair and take only the one he prescribed for her.  She did this and was having trouble breathing.  She has gotten back on her Advair along with the other med and feels better.

## 2015-09-16 ENCOUNTER — Telehealth: Payer: Self-pay | Admitting: Internal Medicine

## 2015-09-16 NOTE — Telephone Encounter (Signed)
Spoke with pt. Advised her that I do not see where our office called her. Nothing further was needed.

## 2015-09-16 NOTE — Telephone Encounter (Signed)
lmtcb x1 for pt. 

## 2015-09-17 NOTE — Telephone Encounter (Signed)
LMTCB x2 for pt 

## 2015-09-18 ENCOUNTER — Telehealth: Payer: Self-pay | Admitting: Internal Medicine

## 2015-09-18 NOTE — Telephone Encounter (Signed)
LMTCB x1 on named VM

## 2015-09-18 NOTE — Telephone Encounter (Signed)
lmtcb x3 for pt. 

## 2015-09-21 NOTE — Telephone Encounter (Signed)
LMTCB

## 2015-09-21 NOTE — Telephone Encounter (Signed)
LMTCB x 4 Message will be closed per triage protocol.

## 2015-09-21 NOTE — Telephone Encounter (Signed)
Patient returned call, may be reached at [phone]. Patient is getting ready to go to a funeral.  Requests that if cannot call back before 11:30 to wait and call this afternoon.

## 2015-09-21 NOTE — Telephone Encounter (Signed)
Patient Returned call [phone]

## 2015-09-22 NOTE — Telephone Encounter (Signed)
Patient notified of MW's recommendations. Patient scheduled to see TP on 10/15/15 Patient aware of appointment and advised to bring all medications to appointment. Patient verbalized understanding. Nothing further needed.

## 2015-09-22 NOTE — Telephone Encounter (Signed)
Should stop stiolto if decides to go back on advair/spiriva maint but will need f/u p holidays to regroup if not happy with this comb to see Elsworth Soho or Lynelle Smoke NP

## 2015-09-22 NOTE — Telephone Encounter (Signed)
Called spoke with pt. She reports she had to go back to advair as just the stiolto was not working for her,. Please advise MW thanks

## 2015-10-15 ENCOUNTER — Ambulatory Visit: Payer: Medicare Other | Admitting: Adult Health

## 2015-12-11 DIAGNOSIS — R630 Anorexia: Secondary | ICD-10-CM | POA: Diagnosis not present

## 2015-12-11 DIAGNOSIS — J441 Chronic obstructive pulmonary disease with (acute) exacerbation: Secondary | ICD-10-CM | POA: Diagnosis not present

## 2016-01-17 ENCOUNTER — Inpatient Hospital Stay (HOSPITAL_COMMUNITY): Payer: Medicare Other

## 2016-01-17 ENCOUNTER — Emergency Department (HOSPITAL_COMMUNITY): Payer: Medicare Other

## 2016-01-17 ENCOUNTER — Encounter (HOSPITAL_COMMUNITY): Payer: Self-pay | Admitting: Emergency Medicine

## 2016-01-17 ENCOUNTER — Inpatient Hospital Stay (HOSPITAL_COMMUNITY)
Admission: EM | Admit: 2016-01-17 | Discharge: 2016-01-22 | DRG: 190 | Disposition: A | Discharge status: Home Health | Payer: Medicare Other | Attending: Internal Medicine | Admitting: Internal Medicine

## 2016-01-17 DIAGNOSIS — R079 Chest pain, unspecified: Secondary | ICD-10-CM | POA: Diagnosis not present

## 2016-01-17 DIAGNOSIS — J441 Chronic obstructive pulmonary disease with (acute) exacerbation: Secondary | ICD-10-CM | POA: Diagnosis not present

## 2016-01-17 DIAGNOSIS — I248 Other forms of acute ischemic heart disease: Secondary | ICD-10-CM | POA: Diagnosis not present

## 2016-01-17 DIAGNOSIS — I340 Nonrheumatic mitral (valve) insufficiency: Secondary | ICD-10-CM | POA: Insufficient documentation

## 2016-01-17 DIAGNOSIS — R739 Hyperglycemia, unspecified: Secondary | ICD-10-CM | POA: Diagnosis present

## 2016-01-17 DIAGNOSIS — I10 Essential (primary) hypertension: Secondary | ICD-10-CM | POA: Diagnosis not present

## 2016-01-17 DIAGNOSIS — J449 Chronic obstructive pulmonary disease, unspecified: Secondary | ICD-10-CM

## 2016-01-17 DIAGNOSIS — T380X5A Adverse effect of glucocorticoids and synthetic analogues, initial encounter: Secondary | ICD-10-CM | POA: Diagnosis present

## 2016-01-17 DIAGNOSIS — Z681 Body mass index (BMI) 19 or less, adult: Secondary | ICD-10-CM | POA: Diagnosis not present

## 2016-01-17 DIAGNOSIS — J9611 Chronic respiratory failure with hypoxia: Secondary | ICD-10-CM

## 2016-01-17 DIAGNOSIS — R778 Other specified abnormalities of plasma proteins: Secondary | ICD-10-CM | POA: Diagnosis present

## 2016-01-17 DIAGNOSIS — Z87891 Personal history of nicotine dependence: Secondary | ICD-10-CM

## 2016-01-17 DIAGNOSIS — J9620 Acute and chronic respiratory failure, unspecified whether with hypoxia or hypercapnia: Secondary | ICD-10-CM

## 2016-01-17 DIAGNOSIS — Z8249 Family history of ischemic heart disease and other diseases of the circulatory system: Secondary | ICD-10-CM

## 2016-01-17 DIAGNOSIS — Z7951 Long term (current) use of inhaled steroids: Secondary | ICD-10-CM | POA: Diagnosis not present

## 2016-01-17 DIAGNOSIS — R627 Adult failure to thrive: Secondary | ICD-10-CM | POA: Diagnosis present

## 2016-01-17 DIAGNOSIS — R0602 Shortness of breath: Secondary | ICD-10-CM | POA: Diagnosis not present

## 2016-01-17 DIAGNOSIS — E43 Unspecified severe protein-calorie malnutrition: Secondary | ICD-10-CM | POA: Diagnosis present

## 2016-01-17 DIAGNOSIS — R0902 Hypoxemia: Secondary | ICD-10-CM | POA: Diagnosis not present

## 2016-01-17 DIAGNOSIS — E039 Hypothyroidism, unspecified: Secondary | ICD-10-CM | POA: Diagnosis present

## 2016-01-17 DIAGNOSIS — Z9981 Dependence on supplemental oxygen: Secondary | ICD-10-CM

## 2016-01-17 DIAGNOSIS — R000 Tachycardia, unspecified: Secondary | ICD-10-CM | POA: Diagnosis not present

## 2016-01-17 DIAGNOSIS — E785 Hyperlipidemia, unspecified: Secondary | ICD-10-CM | POA: Diagnosis present

## 2016-01-17 DIAGNOSIS — R7989 Other specified abnormal findings of blood chemistry: Secondary | ICD-10-CM | POA: Diagnosis present

## 2016-01-17 DIAGNOSIS — R069 Unspecified abnormalities of breathing: Secondary | ICD-10-CM | POA: Diagnosis not present

## 2016-01-17 DIAGNOSIS — J9621 Acute and chronic respiratory failure with hypoxia: Secondary | ICD-10-CM | POA: Diagnosis not present

## 2016-01-17 DIAGNOSIS — I209 Angina pectoris, unspecified: Secondary | ICD-10-CM | POA: Diagnosis not present

## 2016-01-17 DIAGNOSIS — I2489 Other forms of acute ischemic heart disease: Secondary | ICD-10-CM | POA: Insufficient documentation

## 2016-01-17 DIAGNOSIS — R072 Precordial pain: Secondary | ICD-10-CM | POA: Diagnosis not present

## 2016-01-17 DIAGNOSIS — R0789 Other chest pain: Secondary | ICD-10-CM | POA: Diagnosis not present

## 2016-01-17 HISTORY — DX: Acute and chronic respiratory failure, unspecified whether with hypoxia or hypercapnia: J96.20

## 2016-01-17 LAB — POCT I-STAT TROPONIN I: TROPONIN I, POC: 0.38 ng/mL — AB (ref 0.00–0.08)

## 2016-01-17 LAB — TROPONIN I
Troponin I: 0.7 ng/mL (ref <0.031)
Troponin I: 0.75 ng/mL (ref <0.031)

## 2016-01-17 LAB — I-STAT VENOUS BLOOD GAS, ED
ACID-BASE EXCESS: 1 mmol/L (ref 0.0–2.0)
Bicarbonate: 29.5 mEq/L — ABNORMAL HIGH (ref 20.0–24.0)
O2 SAT: 35 %
PCO2 VEN: 61.9 mmHg — AB (ref 45.0–50.0)
PO2 VEN: 24 mmHg — AB (ref 31.0–45.0)
TCO2: 31 mmol/L (ref 0–100)
pH, Ven: 7.286 (ref 7.250–7.300)

## 2016-01-17 LAB — CBC WITH DIFFERENTIAL/PLATELET
BASOS PCT: 1 %
Basophils Absolute: 0 10*3/uL (ref 0.0–0.1)
EOS ABS: 0.1 10*3/uL (ref 0.0–0.7)
EOS PCT: 1 %
HCT: 35.8 % — ABNORMAL LOW (ref 36.0–46.0)
Hemoglobin: 11.5 g/dL — ABNORMAL LOW (ref 12.0–15.0)
LYMPHS ABS: 2.5 10*3/uL (ref 0.7–4.0)
Lymphocytes Relative: 33 %
MCH: 29 pg (ref 26.0–34.0)
MCHC: 32.1 g/dL (ref 30.0–36.0)
MCV: 90.2 fL (ref 78.0–100.0)
Monocytes Absolute: 0.7 10*3/uL (ref 0.1–1.0)
Monocytes Relative: 9 %
NEUTROS PCT: 56 %
Neutro Abs: 4.4 10*3/uL (ref 1.7–7.7)
PLATELETS: 141 10*3/uL — AB (ref 150–400)
RBC: 3.97 MIL/uL (ref 3.87–5.11)
RDW: 13.8 % (ref 11.5–15.5)
WBC: 7.6 10*3/uL (ref 4.0–10.5)

## 2016-01-17 LAB — BASIC METABOLIC PANEL
Anion gap: 8 (ref 5–15)
BUN: <5 mg/dL — ABNORMAL LOW (ref 6–20)
CALCIUM: 9.3 mg/dL (ref 8.9–10.3)
CO2: 27 mmol/L (ref 22–32)
CREATININE: 0.49 mg/dL (ref 0.44–1.00)
Chloride: 105 mmol/L (ref 101–111)
Glucose, Bld: 194 mg/dL — ABNORMAL HIGH (ref 65–99)
Potassium: 3.7 mmol/L (ref 3.5–5.1)
SODIUM: 140 mmol/L (ref 135–145)

## 2016-01-17 LAB — GLUCOSE, CAPILLARY
GLUCOSE-CAPILLARY: 91 mg/dL (ref 65–99)
Glucose-Capillary: 192 mg/dL — ABNORMAL HIGH (ref 65–99)

## 2016-01-17 LAB — I-STAT TROPONIN, ED: Troponin i, poc: 0.19 ng/mL (ref 0.00–0.08)

## 2016-01-17 LAB — APTT: APTT: 30 s (ref 24–37)

## 2016-01-17 LAB — PROTIME-INR
INR: 1.07 (ref 0.00–1.49)
Prothrombin Time: 14.1 seconds (ref 11.6–15.2)

## 2016-01-17 LAB — MRSA PCR SCREENING
MRSA BY PCR: NEGATIVE
MRSA by PCR: NEGATIVE

## 2016-01-17 LAB — TSH: TSH: 0.263 u[IU]/mL — ABNORMAL LOW (ref 0.350–4.500)

## 2016-01-17 MED ORDER — LEVOTHYROXINE SODIUM 75 MCG PO TABS
75.0000 ug | ORAL_TABLET | Freq: Every day | ORAL | Status: DC
  Administered 2016-01-18: 75 ug via ORAL
  Filled 2016-01-17: qty 1

## 2016-01-17 MED ORDER — ONDANSETRON HCL 4 MG PO TABS: 4.0000 mg | ORAL_TABLET | Freq: Four times a day (QID) | ORAL | Status: DC | PRN

## 2016-01-17 MED ORDER — INSULIN ASPART 100 UNIT/ML ~~LOC~~ SOLN
0.0000 [IU] | Freq: Three times a day (TID) | SUBCUTANEOUS | Status: DC
  Administered 2016-01-17 – 2016-01-18 (×2): 3 [IU] via SUBCUTANEOUS
  Administered 2016-01-18 (×2): 2 [IU] via SUBCUTANEOUS
  Administered 2016-01-19 – 2016-01-20 (×4): 3 [IU] via SUBCUTANEOUS
  Administered 2016-01-21: 5 [IU] via SUBCUTANEOUS
  Administered 2016-01-21: 2 [IU] via SUBCUTANEOUS
  Administered 2016-01-21 – 2016-01-22 (×2): 3 [IU] via SUBCUTANEOUS
  Administered 2016-01-22: 2 [IU] via SUBCUTANEOUS

## 2016-01-17 MED ORDER — ATORVASTATIN CALCIUM 40 MG PO TABS
40.0000 mg | ORAL_TABLET | Freq: Once | ORAL | Status: AC
  Administered 2016-01-17: 40 mg via ORAL
  Filled 2016-01-17: qty 1

## 2016-01-17 MED ORDER — IPRATROPIUM BROMIDE 0.02 % IN SOLN
0.5000 mg | Freq: Four times a day (QID) | RESPIRATORY_TRACT | Status: DC
  Administered 2016-01-17 – 2016-01-19 (×6): 0.5 mg via RESPIRATORY_TRACT
  Filled 2016-01-17 (×6): qty 2.5

## 2016-01-17 MED ORDER — ONDANSETRON HCL 4 MG/2ML IJ SOLN: 4.0000 mg | Freq: Four times a day (QID) | INTRAMUSCULAR | Status: DC | PRN

## 2016-01-17 MED ORDER — IOPAMIDOL (ISOVUE-370) INJECTION 76%
INTRAVENOUS | Status: AC
Start: 2016-01-17 — End: 2016-01-17
  Administered 2016-01-17: 100 mL
  Filled 2016-01-17: qty 100

## 2016-01-17 MED ORDER — ALBUTEROL (5 MG/ML) CONTINUOUS INHALATION SOLN
INHALATION_SOLUTION | RESPIRATORY_TRACT | Status: AC
  Filled 2016-01-17: qty 20

## 2016-01-17 MED ORDER — ASPIRIN 81 MG PO CHEW
324.0000 mg | CHEWABLE_TABLET | Freq: Once | ORAL | Status: AC
  Administered 2016-01-17: 324 mg via ORAL
  Filled 2016-01-17: qty 4

## 2016-01-17 MED ORDER — HEPARIN BOLUS VIA INFUSION
2500.0000 [IU] | Freq: Once | INTRAVENOUS | Status: AC
  Administered 2016-01-17: 2500 [IU] via INTRAVENOUS
  Filled 2016-01-17: qty 2500

## 2016-01-17 MED ORDER — ACETAMINOPHEN 650 MG RE SUPP
650.0000 mg | Freq: Four times a day (QID) | RECTAL | Status: DC | PRN
Start: 2016-01-17 — End: 2016-01-22

## 2016-01-17 MED ORDER — GUAIFENESIN ER 600 MG PO TB12
600.0000 mg | ORAL_TABLET | Freq: Two times a day (BID) | ORAL | Status: DC
  Administered 2016-01-17 – 2016-01-22 (×11): 600 mg via ORAL
  Filled 2016-01-17 (×11): qty 1

## 2016-01-17 MED ORDER — MAGNESIUM SULFATE 2 GM/50ML IV SOLN
2.0000 g | Freq: Once | INTRAVENOUS | Status: AC
  Administered 2016-01-17: 2 g via INTRAVENOUS
  Filled 2016-01-17: qty 50

## 2016-01-17 MED ORDER — BISACODYL 5 MG PO TBEC
5.0000 mg | DELAYED_RELEASE_TABLET | Freq: Every day | ORAL | Status: DC | PRN
  Filled 2016-01-17: qty 1

## 2016-01-17 MED ORDER — PRAVASTATIN SODIUM 10 MG PO TABS
10.0000 mg | ORAL_TABLET | Freq: Every day | ORAL | Status: DC
  Administered 2016-01-17: 10 mg via ORAL
  Filled 2016-01-17 (×2): qty 1

## 2016-01-17 MED ORDER — ALBUTEROL (5 MG/ML) CONTINUOUS INHALATION SOLN
10.0000 mg/h | INHALATION_SOLUTION | RESPIRATORY_TRACT | Status: DC
Start: 2016-01-17 — End: 2016-01-22
  Administered 2016-01-17: 10 mg/h via RESPIRATORY_TRACT

## 2016-01-17 MED ORDER — INSULIN ASPART 100 UNIT/ML ~~LOC~~ SOLN
0.0000 [IU] | Freq: Every day | SUBCUTANEOUS | Status: DC
  Administered 2016-01-19: 2 [IU] via SUBCUTANEOUS

## 2016-01-17 MED ORDER — LEVALBUTEROL HCL 0.63 MG/3ML IN NEBU
1.2500 mg | INHALATION_SOLUTION | Freq: Four times a day (QID) | RESPIRATORY_TRACT | Status: DC
  Administered 2016-01-17: 1.26 mg via RESPIRATORY_TRACT
  Administered 2016-01-18: 0.63 mg via RESPIRATORY_TRACT
  Administered 2016-01-18 (×2): 1.25 mg via RESPIRATORY_TRACT
  Administered 2016-01-19: 0.63 mg via RESPIRATORY_TRACT
  Filled 2016-01-17 (×6): qty 6

## 2016-01-17 MED ORDER — HEPARIN (PORCINE) IN NACL 100-0.45 UNIT/ML-% IJ SOLN
650.0000 [IU]/h | INTRAMUSCULAR | Status: DC
Start: 2016-01-17 — End: 2016-01-19
  Administered 2016-01-19: 650 [IU]/h via INTRAVENOUS
  Filled 2016-01-17: qty 250

## 2016-01-17 MED ORDER — SODIUM CHLORIDE 0.9 % IV BOLUS (SEPSIS)
500.0000 mL | Freq: Once | INTRAVENOUS | Status: AC
  Administered 2016-01-17: 500 mL via INTRAVENOUS

## 2016-01-17 MED ORDER — LEVOFLOXACIN 500 MG PO TABS
500.0000 mg | ORAL_TABLET | ORAL | Status: DC
  Administered 2016-01-17 – 2016-01-21 (×5): 500 mg via ORAL
  Filled 2016-01-17 (×5): qty 1

## 2016-01-17 MED ORDER — ACETAMINOPHEN 325 MG PO TABS
650.0000 mg | ORAL_TABLET | Freq: Four times a day (QID) | ORAL | Status: DC | PRN
  Administered 2016-01-19: 650 mg via ORAL
  Filled 2016-01-17: qty 2

## 2016-01-17 MED ORDER — ALPRAZOLAM 0.25 MG PO TABS
0.2500 mg | ORAL_TABLET | Freq: Two times a day (BID) | ORAL | Status: DC | PRN
  Administered 2016-01-18 – 2016-01-21 (×5): 0.25 mg via ORAL
  Filled 2016-01-17 (×5): qty 1

## 2016-01-17 MED ORDER — LEVALBUTEROL HCL 1.25 MG/0.5ML IN NEBU
1.2500 mg | INHALATION_SOLUTION | RESPIRATORY_TRACT | Status: DC | PRN
  Administered 2016-01-18 – 2016-01-19 (×3): 1.25 mg via RESPIRATORY_TRACT
  Filled 2016-01-17 (×2): qty 0.5

## 2016-01-17 MED ORDER — LEVALBUTEROL HCL 0.63 MG/3ML IN NEBU
0.6300 mg | INHALATION_SOLUTION | RESPIRATORY_TRACT | Status: DC
  Filled 2016-01-17 (×4): qty 3

## 2016-01-17 MED ORDER — ATENOLOL 50 MG PO TABS
25.0000 mg | ORAL_TABLET | Freq: Every day | ORAL | Status: DC
  Administered 2016-01-17 – 2016-01-22 (×6): 25 mg via ORAL
  Filled 2016-01-17 (×7): qty 1

## 2016-01-17 MED ORDER — SODIUM CHLORIDE 0.9 % IV SOLN
INTRAVENOUS | Status: DC
  Administered 2016-01-17: 100 mL/h via INTRAVENOUS
  Administered 2016-01-18: 100 mL via INTRAVENOUS

## 2016-01-17 MED ORDER — HEPARIN (PORCINE) IN NACL 100-0.45 UNIT/ML-% IJ SOLN
INTRAMUSCULAR | Status: AC
  Filled 2016-01-17: qty 250

## 2016-01-17 MED ORDER — METHYLPREDNISOLONE SODIUM SUCC 125 MG IJ SOLR
60.0000 mg | Freq: Four times a day (QID) | INTRAMUSCULAR | Status: DC
  Administered 2016-01-17 – 2016-01-20 (×12): 60 mg via INTRAVENOUS
  Filled 2016-01-17 (×12): qty 2

## 2016-01-17 MED ORDER — ENOXAPARIN SODIUM 40 MG/0.4ML ~~LOC~~ SOLN
40.0000 mg | SUBCUTANEOUS | Status: DC
  Filled 2016-01-17 (×2): qty 0.4

## 2016-01-17 MED ORDER — MORPHINE SULFATE (PF) 2 MG/ML IV SOLN: 1.0000 mg | INTRAVENOUS | Status: DC | PRN

## 2016-01-17 MED ORDER — TRAZODONE HCL 50 MG PO TABS
25.0000 mg | ORAL_TABLET | Freq: Every evening | ORAL | Status: DC | PRN
  Administered 2016-01-17: 25 mg via ORAL
  Filled 2016-01-17: qty 1

## 2016-01-17 MED ORDER — ALBUTEROL SULFATE (2.5 MG/3ML) 0.083% IN NEBU
INHALATION_SOLUTION | RESPIRATORY_TRACT | Status: AC
  Administered 2016-01-17: 5 mg
  Filled 2016-01-17: qty 6

## 2016-01-17 MED ORDER — ENSURE ENLIVE PO LIQD
237.0000 mL | Freq: Two times a day (BID) | ORAL | Status: DC
  Administered 2016-01-18: 237 mL via ORAL

## 2016-01-17 MED ORDER — ZOLPIDEM TARTRATE 5 MG PO TABS: 5.0000 mg | ORAL_TABLET | Freq: Every evening | ORAL | Status: DC | PRN

## 2016-01-17 NOTE — ED Notes (Signed)
Portable xray at bedside.

## 2016-01-17 NOTE — ED Notes (Signed)
Attempted x4 to call report to 3S

## 2016-01-17 NOTE — ED Notes (Signed)
Attempted to call report again to 3S
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
## 2016-01-17 NOTE — Progress Notes (Addendum)
ANTICOAGULATION CONSULT NOTE - Initial Consult  Pharmacy Consult for heparin Indication: chest pain/ACS/R/o PE  No Known Allergies  Patient Measurements: Height: 5\' 1"  (154.9 cm) Weight: 92 lb 9.5 oz (42 kg) IBW/kg (Calculated) : 47.8 Heparin Dosing Weight: 42kg  Vital Signs: Temp: 98.2 F (36.8 C) (04/16 1627) Temp Source: Oral (04/16 1627) BP: 128/98 mmHg (04/16 1627) Pulse Rate: 121 (04/16 1627)  Labs:  Recent Labs  01/17/16 1002 01/17/16 1556  HGB 11.5*  --   HCT 35.8*  --   PLT 141*  --   CREATININE 0.49  --   TROPONINI  --  0.70*    Estimated Creatinine Clearance: 45.9 mL/min (by C-G formula based on Cr of 0.49).   Medical History: Past Medical History  Diagnosis Date  . COPD (chronic obstructive pulmonary disease) ([place])   . Chronic bronchitis   . Hypothyroidism   . Hyperlipidemia   . Hypertension   . Post-menopausal     Medications:  Scheduled:  . albuterol      . atenolol  25 mg Oral Daily  . atorvastatin  40 mg Oral Once  . enoxaparin (LOVENOX) injection  40 mg Subcutaneous Q24H  . guaiFENesin  600 mg Oral BID  . heparin      . insulin aspart  0-15 Units Subcutaneous TID WC  . insulin aspart  0-5 Units Subcutaneous QHS  . levalbuterol  0.63 mg Nebulization Q4H  . levofloxacin  500 mg Oral Q24H  . [START ON 01/18/2016] levothyroxine  75 mcg Oral QAC breakfast  . methylPREDNISolone (SOLU-MEDROL) injection  60 mg Intravenous Q6H   Infusions:  . sodium chloride    . albuterol 10 mg/hr (01/17/16 1132)    Assessment: 65 yo who was admitted for SOB/cough while on home O2. It looks like her troponin is up and heparin has been ordered to r/o MI. She is not on anticoagulant outpt. Baseline labs have been ordered. She is also being r/o for a PE.   Goal of Therapy:  Heparin level 0.3-0.7 units/ml Monitor platelets by anticoagulation protocol: Yes   Plan:   Baseline PTT, INR, CBC Dc lovenox Heparin bolus 2500 units x1 Heparin drip 650  units/hr 8 hr HL Daily HL and CBC  Onnie Boer, PharmD Pager: [phone] 01/17/2016 6:28 PM

## 2016-01-17 NOTE — ED Notes (Signed)
Attempted to call report to 3S 

## 2016-01-17 NOTE — Progress Notes (Signed)
CRITICAL VALUE ALERT  Critical value received:  Troponin 0.36 and 0.7  Date of notification:  01/17/16  Time of notification:  1700  Critical value read back:Yes.    Nurse who received alert:  Noreene Larsson  MD notified (1st page):  Dr. Aggie Moats  Time of first page:  1726  MD notified (2nd page):  Time of second page:  Responding MD:  Dr. Aggie Moats   Time MD responded:  [phone]

## 2016-01-17 NOTE — ED Notes (Signed)
Attempted to call report to 3S, after asking to give report to charge nurse, or another, or bedside report, but floor saying neither of these are an option. ED charge nurse and house coverage notified.

## 2016-01-17 NOTE — ED Notes (Signed)
Pt arrived via [facility] with c/o sob since last night. Pt has COPD, wears 2L Gu[place] baseline at home. Began having sob before bed last night, increased O2 to 3L, gave herself an Albuterol neb trx and an Albuterol inhaler trx this morning still with no relief. Pt was given 10 of Albuterol, 125mg  of Solumedrol, and Atrovent trx. Pt on mask at 10 L, pursed lips and labored breathing, sats 100%.

## 2016-01-17 NOTE — ED Notes (Signed)
RT at bedside.
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
## 2016-01-18 DIAGNOSIS — J449 Chronic obstructive pulmonary disease, unspecified: Secondary | ICD-10-CM

## 2016-01-18 DIAGNOSIS — I209 Angina pectoris, unspecified: Secondary | ICD-10-CM

## 2016-01-18 DIAGNOSIS — I10 Essential (primary) hypertension: Secondary | ICD-10-CM

## 2016-01-18 DIAGNOSIS — J9621 Acute and chronic respiratory failure with hypoxia: Secondary | ICD-10-CM

## 2016-01-18 DIAGNOSIS — J441 Chronic obstructive pulmonary disease with (acute) exacerbation: Principal | ICD-10-CM

## 2016-01-18 LAB — BASIC METABOLIC PANEL
Anion gap: 8 (ref 5–15)
BUN: 8 mg/dL (ref 6–20)
CHLORIDE: 106 mmol/L (ref 101–111)
CO2: 26 mmol/L (ref 22–32)
Calcium: 9.1 mg/dL (ref 8.9–10.3)
Creatinine, Ser: 0.53 mg/dL (ref 0.44–1.00)
GFR calc Af Amer: >60 mL/min (ref >60)
GLUCOSE: 170 mg/dL — AB (ref 65–99)
POTASSIUM: 4.5 mmol/L (ref 3.5–5.1)
Sodium: 140 mmol/L (ref 135–145)

## 2016-01-18 LAB — CBC
HEMATOCRIT: 31.6 % — AB (ref 36.0–46.0)
HEMOGLOBIN: 10.3 g/dL — AB (ref 12.0–15.0)
MCH: 28.9 pg (ref 26.0–34.0)
MCHC: 32.6 g/dL (ref 30.0–36.0)
MCV: 88.8 fL (ref 78.0–100.0)
Platelets: 177 10*3/uL (ref 150–400)
RBC: 3.56 MIL/uL — ABNORMAL LOW (ref 3.87–5.11)
RDW: 14 % (ref 11.5–15.5)
WBC: 5.8 10*3/uL (ref 4.0–10.5)

## 2016-01-18 LAB — GLUCOSE, CAPILLARY
GLUCOSE-CAPILLARY: 194 mg/dL — AB (ref 65–99)
GLUCOSE-CAPILLARY: 125 mg/dL — AB (ref 65–99)
Glucose-Capillary: 135 mg/dL — ABNORMAL HIGH (ref 65–99)
Glucose-Capillary: 172 mg/dL — ABNORMAL HIGH (ref 65–99)

## 2016-01-18 LAB — TROPONIN I
TROPONIN I: 0.45 ng/mL — AB (ref <0.031)
Troponin I: 0.58 ng/mL (ref <0.031)

## 2016-01-18 LAB — HEPARIN LEVEL (UNFRACTIONATED)
HEPARIN UNFRACTIONATED: 0.67 [IU]/mL (ref 0.30–0.70)
Heparin Unfractionated: 0.36 IU/mL (ref 0.30–0.70)

## 2016-01-18 MED ORDER — ENSURE ENLIVE PO LIQD
237.0000 mL | Freq: Three times a day (TID) | ORAL | Status: DC
  Administered 2016-01-18 – 2016-01-22 (×10): 237 mL via ORAL

## 2016-01-18 MED ORDER — LEVOTHYROXINE SODIUM 50 MCG PO TABS
50.0000 ug | ORAL_TABLET | Freq: Every day | ORAL | Status: DC
  Administered 2016-01-19 – 2016-01-22 (×4): 50 ug via ORAL
  Filled 2016-01-18 (×4): qty 1

## 2016-01-18 NOTE — Progress Notes (Signed)
[name], female   DOB: July 19, 1951, 65 y.o.   MRN: [id number]    PROGRESS NOTE    [name]  [id number] DOB: [birth year] DOA: 01/17/2016  PCP: Antony Blackbird, MD   Outpatient Specialists:   Brief Narrative:  This is a 65 y.o. black female with HTN, emphysema on home oxygen 2L/min, HLD, hypothyroidism who presented with acute SOB and chest pain and admitted for COPD exacerbation.   Assessment & Plan:   Principal Problem:   Acute on chronic respiratory failure with hypoxia, oxygen dependent at baseline 2 L via [place] ([place]) - appears to be due to acute exacerbation of COPD - pt reports feeling better this AM - continue current regimen with BD's scheduled and as needed, solumedrol, empiric ABX - taper down solumedrol as clinically indicated - ? Need for stress test when pt more clinically stable   Active Problems:   Elevated troponins - now trending down, pt denies chest pain this AM - cardiology team following, appreciate assistance - continue Heparin drip for now    Essential hypertension - reasonable inpatient control     Hyperglycemia - steroid induced - keep on SSI     Underweight  - Body mass index is 17.5 kg/(m^2) - nutritionist consulted     Hypothyroidism - TSH low, will lower the dose of synthroid from 75 mcg to 50 mcg - will need repeat TSH in 6 weeks   DVT prophylaxis: on Heparin drip  Code Status: Full  Family Communication: Patient at bedside  Disposition Plan: Home by 4/19  Consultants:   Cardiology   Procedures:   None  Antimicrobials:   Levaquin 4/16 -->   Subjective: Reports feeling better, no chest pain this AM.   Objective: Filed Vitals:   01/18/16 0900 01/18/16 1000 01/18/16 1100 01/18/16 1200  BP:      Pulse: 96 89 92 99  Temp:      TempSrc:      Resp: 20 15 15  39  Height:      Weight:      SpO2: 98% 100% 100% 96%    Intake/Output Summary (Last 24 hours) at 01/18/16 1207 Last data filed at 01/18/16  1100  Gross per 24 hour  Intake 1675.92 ml  Output   1000 ml  Net 675.92 ml   Filed Weights   01/17/16 1627  Weight: 42 kg (92 lb 9.5 oz)    Examination:  General exam: Appears calm and comfortable  Respiratory system: expiratory wheezing still noted, diminished breath sounds at bases  Cardiovascular system: S1 & S2 heard, RRR. No JVD, murmurs, rubs, gallops or clicks. No pedal edema. Gastrointestinal system: Abdomen is nondistended, soft and nontender.  Central nervous system: Alert and oriented. No focal neurological deficits. Extremities: Symmetric 5 x 5 power. Psychiatry: Judgement and insight appear normal. Mood & affect appropriate.   Data Reviewed: I have personally reviewed following labs and imaging studies  CBC:  Recent Labs Lab 01/17/16 1002 01/18/16 0220  WBC 7.6 5.8  NEUTROABS 4.4  --   HGB 11.5* 10.3*  HCT 35.8* 31.6*  MCV 90.2 88.8  PLT 141* [id number]   Basic Metabolic Panel:  Recent Labs Lab 01/17/16 1002 01/18/16 0220  NA 140 140  K 3.7 4.5  CL 105 106  CO2 27 26  GLUCOSE 194* 170*  BUN <5* 8  CREATININE 0.49 0.53  CALCIUM 9.3 9.1   Coagulation Profile:  Recent Labs Lab 01/17/16 2011  INR 1.07   Cardiac Enzymes:  Recent Labs Lab 01/17/16 1556 01/17/16 2251 01/18/16 0220 01/18/16 0613  TROPONINI 0.70* 0.75* 0.58* 0.45*   CBG:  Recent Labs Lab 01/17/16 1753 01/17/16 2226 01/18/16 0828 01/18/16 1150  GLUCAP 192* 91 135* 194*   Thyroid Function Tests:  Recent Labs  01/17/16 1827  TSH 0.263*   Urine analysis:    Component Value Date/Time   COLORURINE YELLOW 10/23/2008 1113   APPEARANCEUR CLEAR 10/23/2008 1113   LABSPEC 1.015 05/29/2015 1711   PHURINE 7.0 05/29/2015 1711   GLUCOSEU NEGATIVE 05/29/2015 1711   HGBUR NEGATIVE 05/29/2015 1711   BILIRUBINUR NEGATIVE 05/29/2015 1711   KETONESUR 15* 05/29/2015 1711   PROTEINUR NEGATIVE 05/29/2015 1711   UROBILINOGEN 0.2 05/29/2015 1711   NITRITE NEGATIVE 05/29/2015 1711     LEUKOCYTESUR NEGATIVE 05/29/2015 1711   Recent Results (from the past 240 hour(s))  MRSA PCR Screening     Status: None   Collection Time: 01/17/16  4:30 PM  Result Value Ref Range Status   MRSA by PCR NEGATIVE NEGATIVE Final  MRSA PCR Screening     Status: None   Collection Time: 01/17/16  8:09 PM  Result Value Ref Range Status   MRSA by PCR NEGATIVE NEGATIVE Final    Radiology Studies: Ct Angio Chest Pe W/cm &/or Wo Cm 01/17/2016  No evidence of significant pulmonary embolus. Emphysematous changes in the lungs. Focal ground-glass opacity in the right middle lung. Three-month follow-up suggested.   Dg Chest Port 1 View 01/17/2016  No acute cardiopulmonary disease. Stable hyperinflated lungs with emphysema, suggesting COPD.  Scheduled Meds: . atenolol  25 mg Oral Daily  . feeding supplement (ENSURE ENLIVE)  237 mL Oral BID BM  . guaiFENesin  600 mg Oral BID  . insulin aspart  0-15 Units Subcutaneous TID WC  . insulin aspart  0-5 Units Subcutaneous QHS  . ipratropium  0.5 mg Nebulization Q6H  . levalbuterol  1.25 mg Nebulization Q6H  . levofloxacin  500 mg Oral Q24H  . levothyroxine  75 mcg Oral QAC breakfast  . methylPREDNISolone (SOLU-MEDROL) injection  60 mg Intravenous Q6H   Continuous Infusions: . sodium chloride 100 mL (01/18/16 1026)  . albuterol 10 mg/hr (01/17/16 1132)  . heparin 650 Units/hr (01/18/16 0400)     LOS: 1 day    Time spent: 20 minutes    Faye Ramsay, MD [facility] Pager [phone]  If 7PM-7AM, please contact night-coverage [email or web address] Password [facility] 01/18/2016, 12:07 PM

## 2016-01-18 NOTE — Consult Note (Signed)
   [facility] CM Inpatient Consult   01/18/2016  Benicia Birman 28-Dec-1950 [id number] Patient evaluated for community based chronic disease management services with [facility] as a benefit of patient's Medicare Insurance..  Patient admitted with COPD exacerbation, home oxygen, and Malnutrition Spoke with patient at bedside to explain [facility] services. Consent form signed.  Patient will receive post hospital discharge call and will be evaluated for monthly home visits for assessments and disease process education.  Left contact information and [facility] literature at bedside. Made Inpatient Case Manager aware that [facility] Management following. Of note, [facility] Care Management services does not replace or interfere with any services that are arranged by inpatient case management or social work.  For additional questions or referrals please contact:   Natividad Brood, RN BSN [facility] Liaison  [phone] business mobile phone Toll free office [phone]

## 2016-01-18 NOTE — Progress Notes (Signed)
ANTICOAGULATION CONSULT NOTE - Follow up [place] for heparin Indication: chest pain/ACS/  No Known Allergies  Patient Measurements: Height: 5\' 1"  (154.9 cm) Weight: 92 lb 9.5 oz (42 kg) IBW/kg (Calculated) : 47.8 Heparin Dosing Weight: 42kg  Vital Signs: Temp: 97.8 F (36.6 C) (04/16 2335) Temp Source: Oral (04/16 2335) BP: 128/98 mmHg (04/16 1627) Pulse Rate: 121 (04/16 1627)  Labs:  Recent Labs  01/17/16 1002 01/17/16 1556 01/17/16 2011 01/17/16 2251 01/18/16 0220  HGB 11.5*  --   --   --  10.3*  HCT 35.8*  --   --   --  31.6*  PLT 141*  --   --   --  177  APTT  --   --  30  --   --   LABPROT  --   --  14.1  --   --   INR  --   --  1.07  --   --   HEPARINUNFRC  --   --   --   --  0.36  CREATININE 0.49  --   --   --   --   TROPONINI  --  0.70*  --  0.75*  --     Estimated Creatinine Clearance: 45.9 mL/min (by C-G formula based on Cr of 0.49).   Medical History: Past Medical History  Diagnosis Date  . COPD (chronic obstructive pulmonary disease) ([place])   . Chronic bronchitis   . Hypothyroidism   . Hyperlipidemia   . Hypertension   . Post-menopausal     SchedInfusions:  . sodium chloride 100 mL/hr (01/17/16 1823)  . albuterol 10 mg/hr (01/17/16 1132)  . heparin      Assessment: 65 yo who was admitted for SOB/cough while on home O2. Troponin 0.75 and heparin has been ordered to r/o MI. CT negative for PE. HL 0.36 on 650 units/hr and most recent CBC stable.   Goal of Therapy:  Heparin level 0.3-0.7 units/ml Monitor platelets by anticoagulation protocol: Yes   Plan:  1. Continue Heparin drip at 650 units/hr 2. 8 hr HL for confirmatory  3. Daily HL and CBC   Vincenza Hews, PharmD, BCPS 01/18/2016, 3:25 AM Pager: [phone]

## 2016-01-18 NOTE — Progress Notes (Signed)
Patient Name: [name] Date of Encounter: 01/18/2016  Primary Cardiologist: new    This is a 65 y.o. black female with no known history of CAD but has risk factors (hypertension, tobacco dependence, emphysema on home oxygen 2L/min and hyperlipidemia), hypothyroidism who presented with acute SOB and chest pain and admitted for COPD exacerbation.    Principal Problem:   Acute on chronic respiratory failure (HCC) Active Problems:   Essential hypertension   COPD GOLD III criteria but 02 dep   COPD with exacerbation (HCC)   Elevated troponin   Hyperglycemia   Tachycardia   Chest pain   Acute exacerbation of chronic obstructive pulmonary disease (COPD) ([place])    SUBJECTIVE  Denies any CP or SOB today, but did have both yesterday, chest pain occurred in the setting of severe SOB   CURRENT MEDS . atenolol  25 mg Oral Daily  . feeding supplement (ENSURE ENLIVE)  237 mL Oral BID BM  . guaiFENesin  600 mg Oral BID  . insulin aspart  0-15 Units Subcutaneous TID WC  . insulin aspart  0-5 Units Subcutaneous QHS  . ipratropium  0.5 mg Nebulization Q6H  . levalbuterol  1.25 mg Nebulization Q6H  . levofloxacin  500 mg Oral Q24H  . levothyroxine  75 mcg Oral QAC breakfast  . methylPREDNISolone (SOLU-MEDROL) injection  60 mg Intravenous Q6H    OBJECTIVE  Filed Vitals:   01/18/16 0422 01/18/16 0700 01/18/16 0800 01/18/16 0819  BP: 120/72  136/84   Pulse: 87 96 80   Temp:   97.6 F (36.4 C)   TempSrc:   Oral   Resp: 10 20 18    Height:      Weight:      SpO2: 100% 100% 100% 100%    Intake/Output Summary (Last 24 hours) at 01/18/16 0904 Last data filed at 01/18/16 0800  Gross per 24 hour  Intake 1436.42 ml  Output   1000 ml  Net 436.42 ml   Filed Weights   01/17/16 1627  Weight: 92 lb 9.5 oz (42 kg)    PHYSICAL EXAM  General: Pleasant, NAD. Neuro: Alert and oriented X 3. Moves all extremities spontaneously. Psych: Normal affect. HEENT:  Normal  Neck: Supple  without bruits or JVD. Lungs:  Resp regular and unlabored. Diminished breath sound bilaterally with prolonged expiratory phase and wheezing.  Heart: RRR no s3, s4, or murmurs. Abdomen: Soft, non-tender, non-distended, BS + x 4.  Extremities: No clubbing, cyanosis or edema. DP/PT/Radials 2+ and equal bilaterally.  Accessory Clinical Findings  CBC  Recent Labs  01/17/16 1002 01/18/16 0220  WBC 7.6 5.8  NEUTROABS 4.4  --   HGB 11.5* 10.3*  HCT 35.8* 31.6*  MCV 90.2 88.8  PLT 141* 123XX123   Basic Metabolic Panel  Recent Labs  01/17/16 1002 01/18/16 0220  NA 140 140  K 3.7 4.5  CL 105 106  CO2 27 26  GLUCOSE 194* 170*  BUN <5* 8  CREATININE 0.49 0.53  CALCIUM 9.3 9.1   Cardiac Enzymes  Recent Labs  01/17/16 2251 01/18/16 0220 01/18/16 0613  TROPONINI 0.75* 0.58* 0.45*   Thyroid Function Tests  Recent Labs  01/17/16 1827  TSH 0.263*    TELE NSR with HR 90s    ECG  NSR with HR 80s  Echocardiogram  pending    Radiology/Studies  Ct Angio Chest Pe W/cm &/or Wo Cm  01/17/2016  CLINICAL DATA:  COPD exacerbation. Hypoxia. Shortness of breath starting today. Sharp substernal chest  pain. EXAM: CT ANGIOGRAPHY CHEST WITH CONTRAST TECHNIQUE: Multidetector CT imaging of the chest was performed using the standard protocol during bolus administration of intravenous contrast. Multiplanar CT image reconstructions and MIPs were obtained to evaluate the vascular anatomy. CONTRAST:  80 mL Isovue 370 COMPARISON:  10/22/2008 FINDINGS: Technically adequate study with good opacification of the central and segmental pulmonary arteries. No focal filling defects demonstrated. No evidence of significant pulmonary embolus. Normal heart size. Normal caliber thoracic aorta. No aortic dissection. Calcifications in the aorta. Great vessel origins are patent although rib there is suggestion of stenosis at the origin of the left subclavian artery. Coronary artery calcifications. No  significant lymphadenopathy in the chest. Esophagus is decompressed. Evaluation of lungs is limited due to respiratory motion artifact. There is emphysematous change throughout the lungs. Patchy ground-glass changes demonstrated focally in the right middle lung at 1.4 cm diameter. This could represent tori focus or ground-glass nodule. Follow-up in 3 months suggested. Airways thickening may indicate chronic bronchitis. No pleural effusions. No pneumothorax. Included portions of the upper abdominal organs are grossly unremarkable although visualization is limited due to technique. No destructive bone lesions. Review of the MIP images confirms the above findings. IMPRESSION: No evidence of significant pulmonary embolus. Emphysematous changes in the lungs. Focal ground-glass opacity in the right middle lung. Three-month follow-up suggested. Electronically Signed   By: Lucienne Capers M.D.   On: 01/17/2016 22:19   Dg Chest Port 1 View  01/17/2016  CLINICAL DATA:  Shortness of breath EXAM: PORTABLE CHEST 1 VIEW COMPARISON:  05/29/2015 chest radiograph. FINDINGS: Stable cardiomediastinal silhouette with normal heart size. No pneumothorax. No pleural effusion. Emphysema. No pulmonary edema. No acute consolidative airspace disease. Hyperinflated lungs. IMPRESSION: No acute cardiopulmonary disease. Stable hyperinflated lungs with emphysema, suggesting COPD. Electronically Signed   By: Ilona Sorrel M.D.   On: 01/17/2016 10:26    ASSESSMENT AND PLAN  1. Chest pain with elevated trop provoked by hypoxia  - CTA of chest 4/16 negative for PE, emphysematous changes in the lungs, focal ground glass opacity in RML  - no EKG changes, trop trending down, 0.070 --> 0.75 --> 0.58 --> 0.45, pending echo  - continue atenolol and IV heparin. Once COPD resolve, may consider whether to do stress test. However doubt she can have lexiscan today as she still has a lot of wheezing.   2. Acute on chronic resp failure likely COPD  exaberation: improving slowly  3. Hypothyroidism  - TSH low 0.263, per IM  4. HTN  Signed, Almyra Deforest PA-C Pager: [phone]  I have seen and examined the patient along with Almyra Deforest PA-C.  I have reviewed the chart, notes and new data.  I agree with PA's note.  Key new complaints: no chest pain at this time Key examination changes: still wheezing Key new findings / data: low risk ecg and enzymes trending down. Echo pending  PLAN: If EF abnormal or wall motion abnormal, best to proceed directly to cath. Otherwise, Lexiscan myoview when wheezing is under control.  Sanda Klein, MD, [place] [phone] 01/18/2016, 12:38 PM

## 2016-01-18 NOTE — Care Management Note (Signed)
Case Management Note  Patient Details  Name: Sanaah Megill MRN: [id number] Date of Birth: 07-13-1951  Subjective/Objective:    Patient lives with her sister, pta independent , she has home oxygen with [place] [phone], she is interested in the small portable oxygen.  Patient  Is not with any [place] agency at this time for any [facility] services.  She is interested in [facility], referral sent to [facility].  Patient has transport at discharge, She states she has Medicare A, B and D.  NCM will cont to follow for dc needs.                 Action/Plan:   Expected Discharge Date:                  Expected Discharge Plan:  [place]  In-House Referral:     Discharge planning Services  CM Consult  Post Acute Care Choice:    Choice offered to:     DME Arranged:    DME Agency:     HH Arranged:    [place] Agency:     Status of Service:  In process, will continue to follow  Medicare Important Message Given:    Date Medicare IM Given:    Medicare IM give by:    Date Additional Medicare IM Given:    Additional Medicare Important Message give by:     If discussed at [place] of Stay Meetings, dates discussed:    Additional Comments:  Zenon Mayo, RN 01/18/2016, 3:13 PM

## 2016-01-18 NOTE — Progress Notes (Signed)
ANTICOAGULATION CONSULT NOTE - Follow Up Consult  Pharmacy Consult for Heparin Indication: chest pain/ACS  No Known Allergies  Patient Measurements: Height: 5\' 1"  (154.9 cm) Weight: 92 lb 9.5 oz (42 kg) IBW/kg (Calculated) : 47.8 Heparin Dosing Weight: 42 kg  Vital Signs: Temp: 97.6 F (36.4 C) (04/17 0800) Temp Source: Oral (04/17 0800) BP: 136/84 mmHg (04/17 0800) Pulse Rate: 99 (04/17 1200)  Labs:  Recent Labs  01/17/16 1002  01/17/16 2011 01/17/16 2251 01/18/16 0220 01/18/16 0613 01/18/16 1123  HGB 11.5*  --   --   --  10.3*  --   --   HCT 35.8*  --   --   --  31.6*  --   --   PLT 141*  --   --   --  177  --   --   APTT  --   --  30  --   --   --   --   LABPROT  --   --  14.1  --   --   --   --   INR  --   --  1.07  --   --   --   --   HEPARINUNFRC  --   --   --   --  0.36  --  0.67  CREATININE 0.49  --   --   --  0.53  --   --   TROPONINI  --   < >  --  0.75* 0.58* 0.45*  --   < > = values in this interval not displayed.  Estimated Creatinine Clearance: 45.9 mL/min (by C-G formula based on Cr of 0.53).  Assessment:   Heparin level is upper-range therapeutic (0.67) on 650 units/hr.   Chest CT negative for PE; troponins elevated but trended down.   Echo pending.  Goal of Therapy:  Heparin level 0.3-0.7 units/ml Monitor platelets by anticoagulation protocol: Yes   Plan:   Continue heparin drip at 650 units/hr.  Daily heparin level and CBC while on heparin.  Arty Baumgartner, [place] Pager: [phone] 01/18/2016,1:58 PM

## 2016-01-18 NOTE — Progress Notes (Signed)
Initial Nutrition Assessment  DOCUMENTATION CODES:   Severe malnutrition in context of chronic illness, Underweight  INTERVENTION:    Ensure Enlive PO TID between meals, each supplement provides 350 kcal and 20 grams of protein  NUTRITION DIAGNOSIS:   Malnutrition related to chronic illness as evidenced by severe depletion of muscle mass, energy intake < or equal to 75% for > or equal to 1 month.  GOAL:   Patient will meet greater than or equal to 90% of their needs  MONITOR:   PO intake, Supplement acceptance, I & O's, Labs  REASON FOR ASSESSMENT:   Consult Assessment of nutrition requirement/status  ASSESSMENT:   65 y.o. black female with HTN, emphysema on home oxygen 2L/min, HLD, hypothyroidism who presented with acute SOB and chest pain and admitted on 4/16 for COPD exacerbation.  Nutrition-Focused physical exam completed. Findings are no fat depletion, mild-moderate and severe muscle depletion, and no edema.  Patient reports poor oral intake related to difficulty breathing. She drinks a PO supplement most every day. She continues to lose weight progressively. She has been eating poorly. From dietary recall, suspect she has been consuming < 75% of her estimated needs for > 1 month. Patient with severe PCM.   Diet Order:  Diet heart healthy/carb modified Room service appropriate?: Yes; Fluid consistency:: Thin  Skin:  Reviewed, no issues  Last BM:  unknown  Height:   Ht Readings from Last 1 Encounters:  01/17/16 5\' 1"  (1.549 m)    Weight:   Wt Readings from Last 1 Encounters:  01/17/16 92 lb 9.5 oz (42 kg)    Ideal Body Weight:  47.7 kg  BMI:  Body mass index is 17.5 kg/(m^2).  Estimated Nutritional Needs:   Kcal:  1200-1400  Protein:  60-70 gm  Fluid:  1.5 L  EDUCATION NEEDS:   No education needs identified at this time  Molli Barrows, [place], [place], [place] Pager [phone] After Hours Pager [phone]

## 2016-01-19 ENCOUNTER — Inpatient Hospital Stay (HOSPITAL_COMMUNITY): Payer: Medicare Other

## 2016-01-19 DIAGNOSIS — I340 Nonrheumatic mitral (valve) insufficiency: Secondary | ICD-10-CM | POA: Insufficient documentation

## 2016-01-19 DIAGNOSIS — I248 Other forms of acute ischemic heart disease: Secondary | ICD-10-CM

## 2016-01-19 DIAGNOSIS — R079 Chest pain, unspecified: Secondary | ICD-10-CM

## 2016-01-19 LAB — BASIC METABOLIC PANEL
ANION GAP: 7 (ref 5–15)
BUN: 12 mg/dL (ref 6–20)
CHLORIDE: 108 mmol/L (ref 101–111)
CO2: 29 mmol/L (ref 22–32)
Calcium: 9.5 mg/dL (ref 8.9–10.3)
Creatinine, Ser: 0.52 mg/dL (ref 0.44–1.00)
GFR calc Af Amer: >60 mL/min (ref >60)
GFR calc non Af Amer: >60 mL/min (ref >60)
GLUCOSE: 132 mg/dL — AB (ref 65–99)
POTASSIUM: 4.3 mmol/L (ref 3.5–5.1)
Sodium: 144 mmol/L (ref 135–145)

## 2016-01-19 LAB — ECHOCARDIOGRAM COMPLETE
HEIGHTINCHES: 61 in
Weight: 1562.62 oz

## 2016-01-19 LAB — GLUCOSE, CAPILLARY
GLUCOSE-CAPILLARY: 190 mg/dL — AB (ref 65–99)
Glucose-Capillary: 151 mg/dL — ABNORMAL HIGH (ref 65–99)
Glucose-Capillary: 93 mg/dL (ref 65–99)
Glucose-Capillary: 247 mg/dL — ABNORMAL HIGH (ref 65–99)

## 2016-01-19 LAB — CBC
HEMATOCRIT: 31.1 % — AB (ref 36.0–46.0)
HEMOGLOBIN: 10.4 g/dL — AB (ref 12.0–15.0)
MCH: 30.1 pg (ref 26.0–34.0)
MCHC: 33.4 g/dL (ref 30.0–36.0)
MCV: 90.1 fL (ref 78.0–100.0)
Platelets: 194 10*3/uL (ref 150–400)
RBC: 3.45 MIL/uL — AB (ref 3.87–5.11)
RDW: 14.4 % (ref 11.5–15.5)
WBC: 14.3 10*3/uL — AB (ref 4.0–10.5)

## 2016-01-19 LAB — HEPARIN LEVEL (UNFRACTIONATED): Heparin Unfractionated: 0.52 IU/mL (ref 0.30–0.70)

## 2016-01-19 MED ORDER — LEVALBUTEROL HCL 0.63 MG/3ML IN NEBU
1.2500 mg | INHALATION_SOLUTION | Freq: Three times a day (TID) | RESPIRATORY_TRACT | Status: DC
  Filled 2016-01-19 (×2): qty 6

## 2016-01-19 MED ORDER — LEVALBUTEROL HCL 1.25 MG/0.5ML IN NEBU
1.2500 mg | INHALATION_SOLUTION | Freq: Three times a day (TID) | RESPIRATORY_TRACT | Status: DC
  Administered 2016-01-19 – 2016-01-20 (×2): 1.25 mg via RESPIRATORY_TRACT
  Filled 2016-01-19 (×2): qty 0.5

## 2016-01-19 MED ORDER — LEVALBUTEROL HCL 1.25 MG/0.5ML IN NEBU
INHALATION_SOLUTION | RESPIRATORY_TRACT | Status: AC
  Administered 2016-01-19: 1.25 mg via RESPIRATORY_TRACT
  Filled 2016-01-19: qty 0.5

## 2016-01-19 MED ORDER — IPRATROPIUM BROMIDE 0.02 % IN SOLN
0.5000 mg | Freq: Three times a day (TID) | RESPIRATORY_TRACT | Status: DC
  Administered 2016-01-19 – 2016-01-20 (×4): 0.5 mg via RESPIRATORY_TRACT
  Filled 2016-01-19 (×5): qty 2.5

## 2016-01-19 MED ORDER — HEPARIN SODIUM (PORCINE) 5000 UNIT/ML IJ SOLN
5000.0000 [IU] | Freq: Three times a day (TID) | INTRAMUSCULAR | Status: DC
  Administered 2016-01-19 – 2016-01-22 (×8): 5000 [IU] via SUBCUTANEOUS
  Filled 2016-01-19 (×8): qty 1

## 2016-01-19 NOTE — Progress Notes (Signed)
Patient Name: [name] Date of Encounter: 01/19/2016  Principal Problem:   Acute on chronic respiratory failure [facility]) Active Problems:   Essential hypertension   COPD GOLD III criteria but 02 dep   COPD with exacerbation (HCC)   Elevated troponin   Hyperglycemia   Tachycardia   Chest pain   Acute exacerbation of chronic obstructive pulmonary disease (COPD) ([place])   Length of Stay: 2  SUBJECTIVE  Much improved. Not wearing O2 right now and looks comfortable. Has not been out of bed yet. Echo shows normal LVEF and wall motion. Moderate eccentric MR seen (normal LV size). Coronary artery calcification seen on CT.  CURRENT MEDS . atenolol  25 mg Oral Daily  . feeding supplement (ENSURE ENLIVE)  237 mL Oral TID BM  . guaiFENesin  600 mg Oral BID  . insulin aspart  0-15 Units Subcutaneous TID WC  . insulin aspart  0-5 Units Subcutaneous QHS  . ipratropium  0.5 mg Nebulization TID  . levalbuterol  1.25 mg Nebulization TID  . levofloxacin  500 mg Oral Q24H  . levothyroxine  50 mcg Oral QAC breakfast  . methylPREDNISolone (SOLU-MEDROL) injection  60 mg Intravenous Q6H    OBJECTIVE   Intake/Output Summary (Last 24 hours) at 01/19/16 1258 Last data filed at 01/19/16 0700  Gross per 24 hour  Intake    490 ml  Output    800 ml  Net   -310 ml   Filed Weights   01/17/16 1627 01/19/16 0500  Weight: 42 kg (92 lb 9.5 oz) 44.3 kg (97 lb 10.6 oz)    PHYSICAL EXAM Filed Vitals:   01/19/16 0300 01/19/16 0313 01/19/16 0500 01/19/16 0744  BP: 116/92   145/83  Pulse: 90   88  Temp:  98 F (36.7 C)  97.8 F (36.6 C)  TempSrc:  Oral  Oral  Resp: 34   17  Height:      Weight:   44.3 kg (97 lb 10.6 oz)   SpO2: 100%   100%   General: Alert, oriented x3, no distress Head: no evidence of trauma, PERRL, EOMI, no exophtalmos or lid lag, no myxedema, no xanthelasma; normal ears, nose and oropharynx Neck: normal jugular venous pulsations and no hepatojugular reflux; brisk  carotid pulses without delay and no carotid bruits Chest: clear to auscultation, no signs of consolidation by percussion or palpation, normal fremitus, symmetrical and full respiratory excursions Cardiovascular: normal position and quality of the apical impulse, regular rhythm, normal first and second heart sounds, no rubs or gallops, no audible murmur Abdomen: no tenderness or distention, no masses by palpation, no abnormal pulsatility or arterial bruits, normal bowel sounds, no hepatosplenomegaly Extremities: no clubbing, cyanosis or edema; 2+ radial, ulnar and brachial pulses bilaterally; 2+ right femoral, posterior tibial and dorsalis pedis pulses; 2+ left femoral, posterior tibial and dorsalis pedis pulses; no subclavian or femoral bruits Neurological: grossly nonfocal  LABS  CBC  Recent Labs  01/17/16 1002 01/18/16 0220 01/19/16 0457  WBC 7.6 5.8 14.3*  NEUTROABS 4.4  --   --   HGB 11.5* 10.3* 10.4*  HCT 35.8* 31.6* 31.1*  MCV 90.2 88.8 90.1  PLT 141* 177 [id number]   Basic Metabolic Panel  Recent Labs  01/18/16 0220 01/19/16 0457  NA 140 144  K 4.5 4.3  CL 106 108  CO2 26 29  GLUCOSE 170* 132*  BUN 8 12  CREATININE 0.53 0.52  CALCIUM 9.1 9.5   Liver Function Tests No results for input(s):  AST, ALT, ALKPHOS, BILITOT, PROT, ALBUMIN in the last 72 hours. No results for input(s): LIPASE, AMYLASE in the last 72 hours. Cardiac Enzymes  Recent Labs  01/17/16 2251 01/18/16 0220 01/18/16 0613  TROPONINI 0.75* 0.58* 0.45*   BNP Invalid input(s): POCBNP D-Dimer No results for input(s): DDIMER in the last 72 hours. Hemoglobin A1C No results for input(s): HGBA1C in the last 72 hours. Fasting Lipid Panel No results for input(s): CHOL, HDL, LDLCALC, TRIG, CHOLHDL, LDLDIRECT in the last 72 hours. Thyroid Function Tests  Recent Labs  01/17/16 1827  TSH 0.263*    Radiology Studies Imaging results have been reviewed and Ct Angio Chest Pe W/cm &/or Wo Cm  01/17/2016   CLINICAL DATA:  COPD exacerbation. Hypoxia. Shortness of breath starting today. Sharp substernal chest pain. EXAM: CT ANGIOGRAPHY CHEST WITH CONTRAST TECHNIQUE: Multidetector CT imaging of the chest was performed using the standard protocol during bolus administration of intravenous contrast. Multiplanar CT image reconstructions and MIPs were obtained to evaluate the vascular anatomy. CONTRAST:  80 mL Isovue 370 COMPARISON:  10/22/2008 FINDINGS: Technically adequate study with good opacification of the central and segmental pulmonary arteries. No focal filling defects demonstrated. No evidence of significant pulmonary embolus. Normal heart size. Normal caliber thoracic aorta. No aortic dissection. Calcifications in the aorta. Great vessel origins are patent although rib there is suggestion of stenosis at the origin of the left subclavian artery. Coronary artery calcifications. No significant lymphadenopathy in the chest. Esophagus is decompressed. Evaluation of lungs is limited due to respiratory motion artifact. There is emphysematous change throughout the lungs. Patchy ground-glass changes demonstrated focally in the right middle lung at 1.4 cm diameter. This could represent tori focus or ground-glass nodule. Follow-up in 3 months suggested. Airways thickening may indicate chronic bronchitis. No pleural effusions. No pneumothorax. Included portions of the upper abdominal organs are grossly unremarkable although visualization is limited due to technique. No destructive bone lesions. Review of the MIP images confirms the above findings. IMPRESSION: No evidence of significant pulmonary embolus. Emphysematous changes in the lungs. Focal ground-glass opacity in the right middle lung. Three-month follow-up suggested. Electronically Signed   By: Lucienne Capers M.D.   On: 01/17/2016 22:19    TELE NSR  ASSESSMENT AND PLAN  1. Chest pain with elevated trop provoked by hypoxia - demand ischemia  -  outpatient Lexiscan Myoview and office follow up, after her acute respiratory problems are back to baseline  2. Acute on chronic resp failure likely COPD exaberation: improving slowly  3. Mitral insufficiency: moderate, no need for specific therapy at this time, reevaluate by echo in 1 year.  4. HTN  Sanda Klein, MD, [facility] [phone] office [phone] pager 01/19/2016 12:58 PM

## 2016-01-19 NOTE — Progress Notes (Signed)
  Echocardiogram 2D Echocardiogram has been performed.  [name] 01/19/2016, 10:55 AM

## 2016-01-19 NOTE — Care Management Note (Signed)
Case Management Note  Patient Details  Name: [name] MRN: [id number] Date of Birth: Feb 07, 1951  Subjective/Objective:     Patient lives at home with her sister, she is on home oxygen with [facility] [phone].  Patient is interested in portable oxygen and states she has not been able to get it.  NCM called  [facility] and they state they will just need a script for the portable concentrator, NCM will notify MD for script and also she will need [facility] for COPD program with [facility].  She has also decided to work with [facility].                 Action/Plan:   Expected Discharge Date:                  Expected Discharge Plan:  [place]  In-House Referral:     Discharge planning Services  CM Consult  Post Acute Care Choice:  Home Health Choice offered to:  Patient  DME Arranged:    DME Agency:     HH Arranged:  RN [place] Agency:  [facility]  Status of Service:  In process, will continue to follow  Medicare Important Message Given:    Date Medicare IM Given:    Medicare IM give by:    Date Additional Medicare IM Given:    Additional Medicare Important Message give by:     If discussed at [place] of Stay Meetings, dates discussed:    Additional Comments:  Zenon Mayo, RN 01/19/2016, 11:59 AM

## 2016-01-19 NOTE — Progress Notes (Signed)
[name], female   DOB: [birth year], 65 y.o.   MRN: [id number]    PROGRESS NOTE    [name]  [id number] DOB: Apr 20, 1968 DOA: 01/17/2016  PCP: Antony Blackbird, MD   Outpatient Specialists:   Brief Narrative:  This is a 65 y.o. black female with HTN, emphysema on home oxygen 2L/min, HLD, hypothyroidism who presented with acute SOB and chest pain and admitted for COPD exacerbation.   Assessment & Plan:   Principal Problem:   Acute on chronic respiratory failure with hypoxia, oxygen dependent at baseline 2 L via [place] ([place]) - due to acute exacerbation of COPD - pt reports feeling better this AM, still with wheezing but much improved and better air movement  - continue current regimen with BD's scheduled and as needed, empiric ABX day #3/5 - taper down solumedrol from Q6 hours to BID   Active Problems:   Elevated troponins - now trending down, pt denies chest pain this AM - cardiology team following, appreciate assistance - per cardiology, awaiting for ECHO and if EF abnormal, plan to proceed with cath, otherwise myoview    Essential hypertension - reasonable inpatient control     Hyperglycemia - steroid induced - keep on SSI     Leukocytosis - steroid induced     Underweight, severe PCM - Body mass index is 17.5 kg/(m^2) - nutritionist consulted, assistance and recommendations are appreciated     Hypothyroidism - TSH low, will lower the dose of synthroid from 75 mcg to 50 mcg - will need repeat TSH in 6 weeks   DVT prophylaxis: on Heparin drip  Code Status: Full  Family Communication: Patient at bedside  Disposition Plan: Home by 4/19 or 4/20, when cardiology clears   Consultants:   Cardiology   Procedures:   None  Antimicrobials:   Levaquin 4/16 --> 4/20  Subjective: Reports feeling better, no chest pain this AM.   Objective: Filed Vitals:   01/19/16 0300 01/19/16 0313 01/19/16 0500 01/19/16 0744  BP: 116/92   145/83  Pulse: 90    88  Temp:  98 F (36.7 C)  97.8 F (36.6 C)  TempSrc:  Oral  Oral  Resp: 34   17  Height:      Weight:   44.3 kg (97 lb 10.6 oz)   SpO2: 100%   100%    Intake/Output Summary (Last 24 hours) at 01/19/16 1012 Last data filed at 01/19/16 0700  Gross per 24 hour  Intake  496.5 ml  Output    800 ml  Net -303.5 ml   Filed Weights   01/17/16 1627 01/19/16 0500  Weight: 42 kg (92 lb 9.5 oz) 44.3 kg (97 lb 10.6 oz)    Examination:  General exam: Appears calm and comfortable  Respiratory system: expiratory wheezing mild, diminished breath sounds at bases  Cardiovascular system: S1 & S2 heard, RRR. No JVD, murmurs, rubs, gallops or clicks. No pedal edema. Gastrointestinal system: Abdomen is nondistended, soft and nontender.  Central nervous system: Alert and oriented. No focal neurological deficits. Extremities: Symmetric 5 x 5 power. Psychiatry: Judgement and insight appear normal. Mood & affect appropriate.   Data Reviewed: I have personally reviewed following labs and imaging studies  CBC:  Recent Labs Lab 01/17/16 1002 01/18/16 0220 01/19/16 0457  WBC 7.6 5.8 14.3*  NEUTROABS 4.4  --   --   HGB 11.5* 10.3* 10.4*  HCT 35.8* 31.6* 31.1*  MCV 90.2 88.8 90.1  PLT 141* 177 194  Basic Metabolic Panel:  Recent Labs Lab 01/17/16 1002 01/18/16 0220 01/19/16 0457  NA 140 140 144  K 3.7 4.5 4.3  CL 105 106 108  CO2 27 26 29   GLUCOSE 194* 170* 132*  BUN <5* 8 12  CREATININE 0.49 0.53 0.52  CALCIUM 9.3 9.1 9.5   Coagulation Profile:  Recent Labs Lab 01/17/16 2011  INR 1.07   Cardiac Enzymes:  Recent Labs Lab 01/17/16 1556 01/17/16 2251 01/18/16 0220 01/18/16 0613  TROPONINI 0.70* 0.75* 0.58* 0.45*   CBG:  Recent Labs Lab 01/18/16 0828 01/18/16 1150 01/18/16 1700 01/18/16 2103 01/19/16 0728  GLUCAP 135* 194* 125* 172* 151*   Thyroid Function Tests:  Recent Labs  01/17/16 1827  TSH 0.263*   Urine analysis:    Component Value Date/Time     COLORURINE YELLOW 10/23/2008 1113   APPEARANCEUR CLEAR 10/23/2008 1113   LABSPEC 1.015 05/29/2015 1711   PHURINE 7.0 05/29/2015 1711   GLUCOSEU NEGATIVE 05/29/2015 1711   HGBUR NEGATIVE 05/29/2015 1711   BILIRUBINUR NEGATIVE 05/29/2015 1711   KETONESUR 15* 05/29/2015 1711   PROTEINUR NEGATIVE 05/29/2015 1711   UROBILINOGEN 0.2 05/29/2015 1711   NITRITE NEGATIVE 05/29/2015 1711   LEUKOCYTESUR NEGATIVE 05/29/2015 1711   Recent Results (from the past 240 hour(s))  MRSA PCR Screening     Status: None   Collection Time: 01/17/16  4:30 PM  Result Value Ref Range Status   MRSA by PCR NEGATIVE NEGATIVE Final  MRSA PCR Screening     Status: None   Collection Time: 01/17/16  8:09 PM  Result Value Ref Range Status   MRSA by PCR NEGATIVE NEGATIVE Final    Radiology Studies: Ct Angio Chest Pe W/cm &/or Wo Cm 01/17/2016  No evidence of significant pulmonary embolus. Emphysematous changes in the lungs. Focal ground-glass opacity in the right middle lung. Three-month follow-up suggested.   Dg Chest Port 1 View 01/17/2016  No acute cardiopulmonary disease. Stable hyperinflated lungs with emphysema, suggesting COPD.  Scheduled Meds: . atenolol  25 mg Oral Daily  . feeding supplement (ENSURE ENLIVE)  237 mL Oral TID BM  . guaiFENesin  600 mg Oral BID  . insulin aspart  0-15 Units Subcutaneous TID WC  . insulin aspart  0-5 Units Subcutaneous QHS  . ipratropium  0.5 mg Nebulization TID  . levalbuterol  1.25 mg Nebulization TID  . levofloxacin  500 mg Oral Q24H  . levothyroxine  50 mcg Oral QAC breakfast  . methylPREDNISolone (SOLU-MEDROL) injection  60 mg Intravenous Q6H   Continuous Infusions: . albuterol 10 mg/hr (01/17/16 1132)  . heparin 650 Units/hr (01/19/16 0700)     LOS: 2 days   Time spent: 20 minutes   Faye Ramsay, MD [facility] Pager [phone]  If 7PM-7AM, please contact night-coverage [email or web address] Password [email or web address] 01/19/2016, 10:12 AM

## 2016-01-20 ENCOUNTER — Other Ambulatory Visit: Payer: Self-pay

## 2016-01-20 LAB — BASIC METABOLIC PANEL
ANION GAP: 11 (ref 5–15)
BUN: 13 mg/dL (ref 6–20)
CALCIUM: 10 mg/dL (ref 8.9–10.3)
CO2: 32 mmol/L (ref 22–32)
Chloride: 102 mmol/L (ref 101–111)
Creatinine, Ser: 0.56 mg/dL (ref 0.44–1.00)
GLUCOSE: 195 mg/dL — AB (ref 65–99)
Potassium: 3.9 mmol/L (ref 3.5–5.1)
Sodium: 145 mmol/L (ref 135–145)

## 2016-01-20 LAB — GLUCOSE, CAPILLARY
GLUCOSE-CAPILLARY: 188 mg/dL — AB (ref 65–99)
Glucose-Capillary: 122 mg/dL — ABNORMAL HIGH (ref 65–99)
Glucose-Capillary: 181 mg/dL — ABNORMAL HIGH (ref 65–99)
Glucose-Capillary: 197 mg/dL — ABNORMAL HIGH (ref 65–99)

## 2016-01-20 LAB — CBC
HEMATOCRIT: 36.2 % (ref 36.0–46.0)
HEMOGLOBIN: 11.8 g/dL — AB (ref 12.0–15.0)
MCH: 29.4 pg (ref 26.0–34.0)
MCHC: 32.6 g/dL (ref 30.0–36.0)
MCV: 90 fL (ref 78.0–100.0)
Platelets: 275 10*3/uL (ref 150–400)
RBC: 4.02 MIL/uL (ref 3.87–5.11)
RDW: 14.3 % (ref 11.5–15.5)
WBC: 13.8 10*3/uL — ABNORMAL HIGH (ref 4.0–10.5)

## 2016-01-20 MED ORDER — METHYLPREDNISOLONE SODIUM SUCC 40 MG IJ SOLR
40.0000 mg | Freq: Four times a day (QID) | INTRAMUSCULAR | Status: DC
  Administered 2016-01-20 – 2016-01-22 (×9): 40 mg via INTRAVENOUS
  Filled 2016-01-20 (×9): qty 1

## 2016-01-20 NOTE — Progress Notes (Signed)
[name], female   DOB: 1950-11-03, 65 y.o.   MRN: [id number]    PROGRESS NOTE    [name]  [id number] DOB: [birth year] DOA: 01/17/2016  PCP: Antony Blackbird, MD   Outpatient Specialists:   Brief Narrative:  This is a 65 y.o. black female with HTN, emphysema on home oxygen 2L/min, HLD, hypothyroidism who presented with acute SOB and chest pain and admitted for COPD exacerbation.   Assessment & Plan:   Principal Problem:   Acute on chronic respiratory failure with hypoxia, oxygen dependent at baseline 2 L via [place] ([place]) - due to acute exacerbation of COPD - pt reports feeling better this AM, still with wheezing and slightly worse this am compared to yesterday  - continue current regimen with BD's scheduled and as needed, empiric ABX day #4/5 - keep on solumedrol IV for now until wheezing subsides   Active Problems:   Elevated troponins - now trending down, pt denies chest pain this AM - per cardiology, no plan to proceed with any intervention or stress test at this time, will be done in an outpatient setting     Essential hypertension - reasonable inpatient control     Hyperglycemia - steroid induced - keep on SSI     Leukocytosis - steroid induced, CBC pending this AM     Underweight, severe PCM, FTT  - Body mass index is 17.5 kg/(m^2) - nutritionist consulted, assistance and recommendations are appreciated  - high risk fall as pt looks fragile and with COPD oxygen dependent - PT evaluation     Hypothyroidism - TSH low, will lower the dose of synthroid from 75 mcg to 50 mcg - will need repeat TSH in 6 weeks   DVT prophylaxis: on Heparin drip  Code Status: Full  Family Communication: Patient at bedside  Disposition Plan: Home by 4/20 or 4/21 depends on wheezing and respiratory status   Consultants:   Cardiology   Procedures:   None  Antimicrobials:   Levaquin 4/16 --> 4/20  Subjective: Reports feeling better, no chest pain this AM.     Objective: Filed Vitals:   01/19/16 1959 01/19/16 2155 01/20/16 0513 01/20/16 0832  BP:  147/68 153/93   Pulse:  118 103   Temp:  97.6 F (36.4 C) 97.8 F (36.6 C)   TempSrc:      Resp:  18 16   Height:      Weight:      SpO2: 100% 97% 100% 95%    Intake/Output Summary (Last 24 hours) at 01/20/16 0850 Last data filed at 01/20/16 0352  Gross per 24 hour  Intake    240 ml  Output    600 ml  Net   -360 ml   Filed Weights   01/17/16 1627 01/19/16 0500  Weight: 42 kg (92 lb 9.5 oz) 44.3 kg (97 lb 10.6 oz)    Examination:  General exam: Appears calm and comfortable  Respiratory system: expiratory wheezing bilaterally and diminished breath sounds at bases  Cardiovascular system: S1 & S2 heard, RRR. No JVD, murmurs, rubs, gallops or clicks. No pedal edema. Gastrointestinal system: Abdomen is nondistended, soft and nontender.  Central nervous system: Alert and oriented. No focal neurological deficits. Extremities: Symmetric 5 x 5 power. Psychiatry: Judgement and insight appear normal. Mood & affect appropriate.   Data Reviewed: I have personally reviewed following labs and imaging studies  CBC:  Recent Labs Lab 01/17/16 1002 01/18/16 0220 01/19/16 0457  WBC 7.6 5.8 14.3*  NEUTROABS 4.4  --   --   HGB 11.5* 10.3* 10.4*  HCT 35.8* 31.6* 31.1*  MCV 90.2 88.8 90.1  PLT 141* 177 [id number]   Basic Metabolic Panel:  Recent Labs Lab 01/17/16 1002 01/18/16 0220 01/19/16 0457  NA 140 140 144  K 3.7 4.5 4.3  CL 105 106 108  CO2 27 26 29   GLUCOSE 194* 170* 132*  BUN <5* 8 12  CREATININE 0.49 0.53 0.52  CALCIUM 9.3 9.1 9.5   Coagulation Profile:  Recent Labs Lab 01/17/16 2011  INR 1.07   Cardiac Enzymes:  Recent Labs Lab 01/17/16 1556 01/17/16 2251 01/18/16 0220 01/18/16 0613  TROPONINI 0.70* 0.75* 0.58* 0.45*   CBG:  Recent Labs Lab 01/19/16 0728 01/19/16 1155 01/19/16 1638 01/19/16 2152 01/20/16 0746  GLUCAP 151* 190* 93 247* 188*   Thyroid  Function Tests:  Recent Labs  01/17/16 1827  TSH 0.263*   Urine analysis:    Component Value Date/Time   COLORURINE YELLOW 10/23/2008 1113   APPEARANCEUR CLEAR 10/23/2008 1113   LABSPEC 1.015 05/29/2015 1711   PHURINE 7.0 05/29/2015 1711   GLUCOSEU NEGATIVE 05/29/2015 1711   HGBUR NEGATIVE 05/29/2015 1711   BILIRUBINUR NEGATIVE 05/29/2015 1711   KETONESUR 15* 05/29/2015 1711   PROTEINUR NEGATIVE 05/29/2015 1711   UROBILINOGEN 0.2 05/29/2015 1711   NITRITE NEGATIVE 05/29/2015 1711   LEUKOCYTESUR NEGATIVE 05/29/2015 1711   Recent Results (from the past 240 hour(s))  MRSA PCR Screening     Status: None   Collection Time: 01/17/16  4:30 PM  Result Value Ref Range Status   MRSA by PCR NEGATIVE NEGATIVE Final  MRSA PCR Screening     Status: None   Collection Time: 01/17/16  8:09 PM  Result Value Ref Range Status   MRSA by PCR NEGATIVE NEGATIVE Final    Radiology Studies: Ct Angio Chest Pe W/cm &/or Wo Cm 01/17/2016  No evidence of significant pulmonary embolus. Emphysematous changes in the lungs. Focal ground-glass opacity in the right middle lung. Three-month follow-up suggested.   Dg Chest Port 1 View 01/17/2016  No acute cardiopulmonary disease. Stable hyperinflated lungs with emphysema, suggesting COPD.  Scheduled Meds: . atenolol  25 mg Oral Daily  . feeding supplement (ENSURE ENLIVE)  237 mL Oral TID BM  . guaiFENesin  600 mg Oral BID  . heparin subcutaneous  5,000 Units Subcutaneous Q8H  . insulin aspart  0-15 Units Subcutaneous TID WC  . insulin aspart  0-5 Units Subcutaneous QHS  . ipratropium  0.5 mg Nebulization TID  . levalbuterol  1.25 mg Nebulization TID  . levofloxacin  500 mg Oral Q24H  . levothyroxine  50 mcg Oral QAC breakfast  . methylPREDNISolone (SOLU-MEDROL) injection  60 mg Intravenous Q6H   Continuous Infusions: . albuterol 10 mg/hr (01/17/16 1132)     LOS: 3 days   Time spent: 20 minutes   Faye Ramsay, MD [facility] Pager  [phone]  If 7PM-7AM, please contact night-coverage [email or web address] Password [email or web address] 01/20/2016, 8:50 AM

## 2016-01-20 NOTE — Patient Outreach (Signed)
Chart review for disposition. Will follow up with hospital liaison for disposition.

## 2016-01-20 NOTE — Progress Notes (Signed)
CM faxed prescription for portable concentrator to [facility]  @ [phone]. Whitman Hero RN,BSN,CM [phone]

## 2016-01-20 NOTE — Care Management Important Message (Signed)
Important Message  Patient Details  Name: [name] MRN: [id number] Date of Birth: 14-Apr-1951   Medicare Important Message Given:  Yes    [name] Abena 01/20/2016, 10:02 AM

## 2016-01-20 NOTE — Progress Notes (Signed)
Patient Name: [name] Date of Encounter: 01/20/2016  Hospital Problem List     Principal Problem:   Acute on chronic respiratory failure [facility]) Active Problems:   Essential hypertension   COPD GOLD III criteria but 02 dep   COPD with exacerbation (HCC)   Elevated troponin   Hyperglycemia   Tachycardia   Chest pain   Acute exacerbation of chronic obstructive pulmonary disease (COPD) ([place])   Demand ischemia (HCC)   Mitral insufficiency    Subjective   Feeling much better this morning. Only wearing [place], sitting up in the bed. Breathing easier  Inpatient Medications    . atenolol  25 mg Oral Daily  . feeding supplement (ENSURE ENLIVE)  237 mL Oral TID BM  . guaiFENesin  600 mg Oral BID  . heparin subcutaneous  5,000 Units Subcutaneous Q8H  . insulin aspart  0-15 Units Subcutaneous TID WC  . insulin aspart  0-5 Units Subcutaneous QHS  . levofloxacin  500 mg Oral Q24H  . levothyroxine  50 mcg Oral QAC breakfast  . methylPREDNISolone (SOLU-MEDROL) injection  40 mg Intravenous Q6H    Vital Signs    Filed Vitals:   01/19/16 1959 01/19/16 2155 01/20/16 0513 01/20/16 0832  BP:  147/68 153/93   Pulse:  118 103   Temp:  97.6 F (36.4 C) 97.8 F (36.6 C)   TempSrc:      Resp:  18 16   Height:      Weight:      SpO2: 100% 97% 100% 95%    Intake/Output Summary (Last 24 hours) at 01/20/16 1132 Last data filed at 01/20/16 0900  Gross per 24 hour  Intake    360 ml  Output    600 ml  Net   -240 ml   Filed Weights   01/17/16 1627 01/19/16 0500  Weight: 92 lb 9.5 oz (42 kg) 97 lb 10.6 oz (44.3 kg)    Physical Exam    General: Pleasant frail older female, NAD. Neuro: Alert and oriented X 3. Moves all extremities spontaneously. Psych: Normal affect. HEENT:  Normal  Neck: Supple without bruits or JVD. Lungs:  Resp regular and unlabored, Minimal wheezing noted throughout. Heart: RRR no s3, s4, or murmurs. Abdomen: Soft, non-tender, non-distended, BS + x 4.    Extremities: No clubbing, cyanosis or edema. DP/PT/Radials 2+ and equal bilaterally.  Labs    CBC  Recent Labs  01/19/16 0457 01/20/16 0806  WBC 14.3* 13.8*  HGB 10.4* 11.8*  HCT 31.1* 36.2  MCV 90.1 90.0  PLT 194 [id number]   Basic Metabolic Panel  Recent Labs  01/19/16 0457 01/20/16 0806  NA 144 145  K 4.3 3.9  CL 108 102  CO2 29 32  GLUCOSE 132* 195*  BUN 12 13  CREATININE 0.52 0.56  CALCIUM 9.5 10.0   Cardiac Enzymes  Recent Labs  01/17/16 2251 01/18/16 0220 01/18/16 0613  TROPONINI 0.75* 0.58* 0.45*   Thyroid Function Tests  Recent Labs  01/17/16 1827  TSH 0.263*    Telemetry    Currently SR Rate- 80 Remained Tachycardiac throughout the night with rate between 120-140, relates this to when she moves around.   ECG    No recent EKG   Radiology      Assessment & Plan    1. Chest pain with elevated trop provoked by hypoxia - demand ischemia, trops have trended down  - No chest pain throughout the night - outpatient Lexiscan Myoview and office follow  up, after her acute respiratory problems are back                  to baseline  2. Acute on chronic resp failure likely COPD exaberation:   -improving slowly, minimal wheezing noted on exam, remains on solu-medrol  3. Mitral insufficiency: moderate, no need for specific therapy at this time, reevaluate by echo in 1 year.  4. HTN: controlled with current medications.  Signed, Reino Bellis NP-C Pager [phone]  I have seen and examined the patient along with Reino Bellis NP-C.  I have reviewed the chart, notes and new data.  I agree with PA/NP's note.  Please let us know when ready for discharge to schedule the Myoview study and follow up.  Sanda Klein, MD, [place] [phone] 01/20/2016, 12:22 PM

## 2016-01-20 NOTE — Evaluation (Signed)
Physical Therapy Evaluation Patient Details Name: [name] MRN: [id number] DOB: [birth year] Today's Date: 01/20/2016   History of Present Illness  Pt is a 65 y.o. black female with HTN, emphysema on home oxygen 2L/min, HLD, hypothyroidism who presented with acute SOB and chest pain and admitted for COPD exacerbation.   Clinical Impression  Pt admitted with above diagnosis. Pt currently with functional limitations due to the deficits listed below (see PT Problem List). Pt with 2-3/4 DOE with ambulation, though O2 sats 98% on 3L O2. Discussed energy conservation techniques including use of rollator with seat. Pt agreeable. Pt also reports she has had difficulty getting out of her tub and would benefit from tub bench.  Pt will benefit from skilled PT to increase their independence and safety with mobility to allow discharge to the venue listed below.       Follow Up Recommendations No PT follow up    Equipment Recommendations  Other (comment) (rollator with seat (youth height)) , tub bench   Recommendations for Other Services       Precautions / Restrictions Precautions Precaution Comments: watch O2 sats Restrictions Weight Bearing Restrictions: No      Mobility  Bed Mobility               General bed mobility comments: pt received sitting EOB  Transfers Overall transfer level: Needs assistance Equipment used: Rolling walker (2 wheeled) Transfers: Sit to/from Stand Sit to Stand: Min guard         General transfer comment: min-guard for safety. Pt with 1 posterior LOB standing EOB but braced legs against bed and remained standing  Ambulation/Gait Ambulation/Gait assistance: Min assist Ambulation Distance (Feet): 50 Feet Assistive device: Rolling walker (2 wheeled) Gait Pattern/deviations: Step-through pattern;Decreased stride length;Trunk flexed Gait velocity: decreased Gait velocity interpretation: <1.8 ft/sec, indicative of risk for recurrent falls General  Gait Details: vc's for energy conservation such as not talking during ambulation. Also educated on RW/ rollator for energy conservation  Stairs            Wheelchair Mobility    Modified Rankin (Stroke Patients Only)       Balance Overall balance assessment: Needs assistance Sitting-balance support: No upper extremity supported Sitting balance-Leahy Scale: Good     Standing balance support: No upper extremity supported Standing balance-Leahy Scale: Fair Standing balance comment: 1 LOB in standing, self correction with legs against bed                             Pertinent Vitals/Pain Pain Assessment: No/denies pain  O2 sats 91% on 2L in sitting. 98% on 3L with ambulation    Home Living Family/patient expects to be discharged to:: Private residence Living Arrangements: Other relatives Available Help at Discharge: Family;Available PRN/intermittently Type of Home: House Home Access: Stairs to enter Entrance Stairs-Rails: None Entrance Stairs-Number of Steps: 1 Home Layout: One level Home Equipment: None Additional Comments: pt on home O2, wears all night and as needed during the day. 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 Lives with her sister    Prior Function Level of Independence: Independent         Comments: pt reports that she cannot stand long enough to shower so has been taking a bath but now has had trouble getting out of tub     Hand Dominance        Extremity/Trunk Assessment   Upper Extremity Assessment: Generalized weakness  Lower Extremity Assessment: Generalized weakness      Cervical / Trunk Assessment: Kyphotic  Communication   Communication: No difficulties  Cognition Arousal/Alertness: Awake/alert Behavior During Therapy: WFL for tasks assessed/performed Overall Cognitive Status: Within Functional Limits for tasks assessed                      General Comments General comments  (skin integrity, edema, etc.): discussed pulse oximeter for home, sister reports that she can purchase. 3L O2 used for mobility, O2 sats 98% though pt with 2-3/4 DOE    Exercises        Assessment/Plan    PT Assessment Patient needs continued PT services  PT Diagnosis Difficulty walking;Generalized weakness   PT Problem List Decreased strength;Decreased activity tolerance;Decreased balance;Decreased mobility;Decreased knowledge of use of DME;Decreased knowledge of precautions;Cardiopulmonary status limiting activity  PT Treatment Interventions DME instruction;Gait training;Stair training;Functional mobility training;Therapeutic activities;Therapeutic exercise;Balance training;Patient/family education   PT Goals (Current goals can be found in the Care Plan section) Acute Rehab PT Goals Patient Stated Goal: return home, breathe better PT Goal Formulation: With patient Time For Goal Achievement: 02/03/16 Potential to Achieve Goals: Fair    Frequency Min 3X/week   Barriers to discharge        Co-evaluation               End of Session Equipment Utilized During Treatment: Gait belt Activity Tolerance: Patient limited by fatigue Patient left: in chair;with call bell/phone within reach;with family/visitor present Nurse Communication: Mobility status         Time: 1215-1238 PT Time Calculation (min) (ACUTE ONLY): 23 min   Charges:   PT Evaluation $PT Eval Moderate Complexity: 1 Procedure PT Treatments $Gait Training: 8-22 mins   PT G Codes:       Leighton Roach, PT  Acute Rehab Services  [place], [place] 01/20/2016, 2:11 PM

## 2016-01-21 LAB — CBC
HCT: 35.1 % — ABNORMAL LOW (ref 36.0–46.0)
HEMOGLOBIN: 11.4 g/dL — AB (ref 12.0–15.0)
MCH: 28.9 pg (ref 26.0–34.0)
MCHC: 32.5 g/dL (ref 30.0–36.0)
MCV: 89.1 fL (ref 78.0–100.0)
PLATELETS: 294 10*3/uL (ref 150–400)
RBC: 3.94 MIL/uL (ref 3.87–5.11)
RDW: 14.1 % (ref 11.5–15.5)
WBC: 10.4 10*3/uL (ref 4.0–10.5)

## 2016-01-21 LAB — GLUCOSE, CAPILLARY
GLUCOSE-CAPILLARY: 146 mg/dL — AB (ref 65–99)
GLUCOSE-CAPILLARY: 207 mg/dL — AB (ref 65–99)
GLUCOSE-CAPILLARY: 147 mg/dL — AB (ref 65–99)
Glucose-Capillary: 175 mg/dL — ABNORMAL HIGH (ref 65–99)

## 2016-01-21 LAB — BASIC METABOLIC PANEL
Anion gap: 12 (ref 5–15)
BUN: 16 mg/dL (ref 6–20)
CHLORIDE: 101 mmol/L (ref 101–111)
CO2: 31 mmol/L (ref 22–32)
Calcium: 9.4 mg/dL (ref 8.9–10.3)
Creatinine, Ser: 0.53 mg/dL (ref 0.44–1.00)
Glucose, Bld: 158 mg/dL — ABNORMAL HIGH (ref 65–99)
POTASSIUM: 4.1 mmol/L (ref 3.5–5.1)
SODIUM: 144 mmol/L (ref 135–145)

## 2016-01-21 NOTE — Progress Notes (Signed)
Nutrition Follow-up  DOCUMENTATION CODES:   Severe malnutrition in context of chronic illness, Underweight  INTERVENTION:   -Continue Ensure Enlive po TID, each supplement provides 350 kcal and 20 grams of protein  NUTRITION DIAGNOSIS:   Malnutrition related to chronic illness as evidenced by severe depletion of muscle mass, energy intake < or equal to 75% for > or equal to 1 month.  Ongoing  GOAL:   Patient will meet greater than or equal to 90% of their needs  Progressing  MONITOR:   PO intake, Supplement acceptance, I & O's, Labs  REASON FOR ASSESSMENT:   Consult Assessment of nutrition requirement/status  ASSESSMENT:   65 y.o. black female with HTN, emphysema on home oxygen 2L/min, HLD, hypothyroidism who presented with acute SOB and chest pain and admitted on 4/16 for COPD exacerbation.  Pt transferred from SDU to medical floor on 01/19/16.   Spoke with pt and pt sister at time of visit; pt in very good spirits and reports feeling much better since admission. Pt reports her appetite is improving and pt sister appears encouraged that pt ate some breakfast this AM (french toast, fruit, coffee, and Ensure). Per pt, "I'm not much of a breakfast person" and consumed approximately 1-1.5 meals per day on average.  Pt reports she loves the Ensure supplements and was taking them PTA. Discussed importance of good meal and supplement intake to promote healing. Encouraged pt to continue supplements at home.   Per MD notes, potential to d/c home tomorrow if able to d/c solumedrol.   Labs reviewed: CBGS: 122-149.   Diet Order:  Diet heart healthy/carb modified Room service appropriate?: Yes; Fluid consistency:: Thin  Skin:  Reviewed, no issues  Last BM:  01/19/16  Height:   Ht Readings from Last 1 Encounters:  01/21/16 5' (1.524 m)    Weight:   Wt Readings from Last 1 Encounters:  01/21/16 92 lb 13 oz (42.1 kg)    Ideal Body Weight:  47.7 kg  BMI:  Body mass  index is 18.13 kg/(m^2).  Estimated Nutritional Needs:   Kcal:  1200-1400  Protein:  60-70 gm  Fluid:  1.5 L  EDUCATION NEEDS:   No education needs identified at this time  Reis Goga A. Jimmye Norman, RD, LDN, CDE Pager: [phone] After hours Pager: [phone]

## 2016-01-21 NOTE — Progress Notes (Signed)
[name], female   DOB: 05-17-1951, 65 y.o.   MRN: [id number]    PROGRESS NOTE    [name]  [id number] DOB: Apr 18, 1951 DOA: 01/17/2016  PCP: Antony Blackbird, MD   Outpatient Specialists:   Brief Narrative:  This is a 65 y.o. black female with HTN, emphysema on home oxygen 2L/min, HLD, hypothyroidism who presented with acute SOB and chest pain and admitted for COPD exacerbation.   Assessment & Plan:   Principal Problem:   Acute on chronic respiratory failure with hypoxia, oxygen dependent at baseline 2 L via [place] ([place]) - due to acute exacerbation of COPD - pt reports feeling better this AM, still with wheezing and poor air movement overall  - unable to taper off solumedrol yet, will keep on it for additional 24 hours and reassess in AM - today is day #5/5 of Levaquin, stop after today's dose   Active Problems:   Elevated troponins - now trending down, pt denies chest pain this AM - per cardiology, no plan to proceed with any intervention or stress test at this time, will be done in an outpatient setting     Essential hypertension - reasonable inpatient control     Hyperglycemia - steroid induced - keep on SSI     Leukocytosis - steroid induced, resolved     Underweight, severe PCM, FTT  - Body mass index is 17.5 kg/(m^2) - nutritionist consulted, assistance and recommendations are appreciated  - high risk fall as pt looks fragile and with COPD oxygen dependent - PT evaluation completed, no further follow up recommended     Hypothyroidism - TSH low, will lower the dose of synthroid from 75 mcg to 50 mcg - will need repeat TSH in 6 weeks   DVT prophylaxis: on Heparin drip  Code Status: Full  Family Communication: Patient at bedside  Disposition Plan: Home by 4/21 if able to take off solumedrol   Consultants:   Cardiology   Procedures:   None  Antimicrobials:   Levaquin 4/16 --> 4/20  Subjective: Reports feeling better, no chest pain  this AM.   Objective: Filed Vitals:   01/20/16 1416 01/20/16 2205 01/21/16 0600 01/21/16 0602  BP: 145/72 143/77  150/95  Pulse: 80 80  88  Temp: 97.9 F (36.6 C) 97.8 F (36.6 C)  97.3 F (36.3 C)  TempSrc: Oral     Resp: 14 16  17   Height:   5' (1.524 m)   Weight:   42.1 kg (92 lb 13 oz)   SpO2: 99% 99%  93%    Intake/Output Summary (Last 24 hours) at 01/21/16 0917 Last data filed at 01/20/16 2200  Gross per 24 hour  Intake    700 ml  Output    400 ml  Net    300 ml   Filed Weights   01/17/16 1627 01/19/16 0500 01/21/16 0600  Weight: 42 kg (92 lb 9.5 oz) 44.3 kg (97 lb 10.6 oz) 42.1 kg (92 lb 13 oz)    Examination:  General exam: Appears calm and comfortable  Respiratory system: expiratory wheezing bilaterally and course breath sounds  Cardiovascular system: S1 & S2 heard, RRR. No JVD, murmurs, rubs, gallops or clicks. No pedal edema. Gastrointestinal system: Abdomen is nondistended, soft and nontender.  Central nervous system: Alert and oriented. No focal neurological deficits. Extremities: Symmetric 5 x 5 power. Psychiatry: Judgement and insight appear normal. Mood & affect appropriate.   Data Reviewed: I have personally reviewed following labs and  imaging studies  CBC:  Recent Labs Lab 01/17/16 1002 01/18/16 0220 01/19/16 0457 01/20/16 0806 01/21/16 0510  WBC 7.6 5.8 14.3* 13.8* 10.4  NEUTROABS 4.4  --   --   --   --   HGB 11.5* 10.3* 10.4* 11.8* 11.4*  HCT 35.8* 31.6* 31.1* 36.2 35.1*  MCV 90.2 88.8 90.1 90.0 89.1  PLT 141* 177 194 275 [id number]   Basic Metabolic Panel:  Recent Labs Lab 01/17/16 1002 01/18/16 0220 01/19/16 0457 01/20/16 0806 01/21/16 0510  NA 140 140 144 145 144  K 3.7 4.5 4.3 3.9 4.1  CL 105 106 108 102 101  CO2 27 26 29  32 31  GLUCOSE 194* 170* 132* 195* 158*  BUN <5* 8 12 13 16   CREATININE 0.49 0.53 0.52 0.56 0.53  CALCIUM 9.3 9.1 9.5 10.0 9.4   Coagulation Profile:  Recent Labs Lab 01/17/16 2011  INR 1.07    Cardiac Enzymes:  Recent Labs Lab 01/17/16 1556 01/17/16 2251 01/18/16 0220 01/18/16 0613  TROPONINI 0.70* 0.75* 0.58* 0.45*   CBG:  Recent Labs Lab 01/20/16 0746 01/20/16 1204 01/20/16 1613 01/20/16 2204 01/21/16 0742  GLUCAP 188* 122* 181* 197* 146*   Thyroid Function Tests: No results for input(s): TSH, T4TOTAL, FREET4, T3FREE, THYROIDAB in the last 72 hours. Urine analysis:    Component Value Date/Time   COLORURINE YELLOW 10/23/2008 1113   APPEARANCEUR CLEAR 10/23/2008 1113   LABSPEC 1.015 05/29/2015 1711   PHURINE 7.0 05/29/2015 1711   GLUCOSEU NEGATIVE 05/29/2015 1711   HGBUR NEGATIVE 05/29/2015 1711   BILIRUBINUR NEGATIVE 05/29/2015 1711   KETONESUR 15* 05/29/2015 1711   PROTEINUR NEGATIVE 05/29/2015 1711   UROBILINOGEN 0.2 05/29/2015 1711   NITRITE NEGATIVE 05/29/2015 1711   LEUKOCYTESUR NEGATIVE 05/29/2015 1711   Recent Results (from the past 240 hour(s))  MRSA PCR Screening     Status: None   Collection Time: 01/17/16  4:30 PM  Result Value Ref Range Status   MRSA by PCR NEGATIVE NEGATIVE Final  MRSA PCR Screening     Status: None   Collection Time: 01/17/16  8:09 PM  Result Value Ref Range Status   MRSA by PCR NEGATIVE NEGATIVE Final    Radiology Studies: Ct Angio Chest Pe W/cm &/or Wo Cm 01/17/2016  No evidence of significant pulmonary embolus. Emphysematous changes in the lungs. Focal ground-glass opacity in the right middle lung. Three-month follow-up suggested.   Dg Chest Port 1 View 01/17/2016  No acute cardiopulmonary disease. Stable hyperinflated lungs with emphysema, suggesting COPD.  Scheduled Meds: . atenolol  25 mg Oral Daily  . feeding supplement (ENSURE ENLIVE)  237 mL Oral TID BM  . guaiFENesin  600 mg Oral BID  . heparin subcutaneous  5,000 Units Subcutaneous Q8H  . insulin aspart  0-15 Units Subcutaneous TID WC  . insulin aspart  0-5 Units Subcutaneous QHS  . levofloxacin  500 mg Oral Q24H  . levothyroxine  50 mcg Oral QAC  breakfast  . methylPREDNISolone (SOLU-MEDROL) injection  40 mg Intravenous Q6H   Continuous Infusions: . albuterol 10 mg/hr (01/17/16 1132)     LOS: 4 days   Time spent: 20 minutes   Faye Ramsay, MD [facility] Pager [phone]  If 7PM-7AM, please contact night-coverage [email or web address] Password [email or web address] 01/21/2016, 9:17 AM

## 2016-01-22 ENCOUNTER — Other Ambulatory Visit: Payer: Self-pay

## 2016-01-22 DIAGNOSIS — R072 Precordial pain: Secondary | ICD-10-CM

## 2016-01-22 LAB — GLUCOSE, CAPILLARY
Glucose-Capillary: 149 mg/dL — ABNORMAL HIGH (ref 65–99)
Glucose-Capillary: 158 mg/dL — ABNORMAL HIGH (ref 65–99)

## 2016-01-22 LAB — CBC
HEMATOCRIT: 36.9 % (ref 36.0–46.0)
HEMOGLOBIN: 12 g/dL (ref 12.0–15.0)
MCH: 28.8 pg (ref 26.0–34.0)
MCHC: 32.5 g/dL (ref 30.0–36.0)
MCV: 88.7 fL (ref 78.0–100.0)
Platelets: 365 10*3/uL (ref 150–400)
RBC: 4.16 MIL/uL (ref 3.87–5.11)
RDW: 13.9 % (ref 11.5–15.5)
WBC: 11.1 10*3/uL — ABNORMAL HIGH (ref 4.0–10.5)

## 2016-01-22 MED ORDER — GUAIFENESIN ER 600 MG PO TB12: 600.0000 mg | ORAL_TABLET | Freq: Two times a day (BID) | ORAL | Status: DC

## 2016-01-22 MED ORDER — PREDNISONE 20 MG PO TABS: 20.0000 mg | ORAL_TABLET | Freq: Every day | ORAL | Status: DC

## 2016-01-22 NOTE — Patient Outreach (Signed)
Per EPIC, patient remains in acute care setting. Will follow up with [facility] hospital Liaison next week for information on disposition.

## 2016-01-22 NOTE — Progress Notes (Signed)
CM followed up with [facility] regarding faxed prescription for portable oxygen tank. Representative Nickola Major) @ [facility] informed CM prescription was received and submitted to headquarters in [place] for approval. Keva shared with CM the process can take up to 4-6 weeks, however once a decision is made (approval/ denial) the office will make contact with patient. CM made pt aware. Whitman Hero RN,BSN,CM [phone]

## 2016-01-22 NOTE — Progress Notes (Signed)
Physical Therapy Treatment Patient Details Name: [name] MRN: [id number] DOB: 1951-07-30 Today's Date: 2016/02/12    History of Present Illness Pt is a 65 y.o. black female with HTN, emphysema on home oxygen 2L/min, HLD, hypothyroidism who presented with acute SOB and chest pain and admitted for COPD exacerbation.     PT Comments    Patient able to ambulate further with ability to rest on rollator in hallway then continue ambulation further after resting.  Feel would benefit from rollator for home/community use and to allow improved access for outings given limited reserve due to COPD and recent hospitalization.  Follow Up Recommendations  No PT follow up     Equipment Recommendations  Other (comment) (rollator (4 wheeled walker))    Recommendations for Other Services       Precautions / Restrictions      Mobility  Bed Mobility               General bed mobility comments: pt received sitting EOB  Transfers Overall transfer level: Needs assistance Equipment used: 4-wheeled walker Transfers: Sit to/from Stand Sit to Stand: Supervision         General transfer comment: stood from 3:1 she was sitting on beside bed unaided  Ambulation/Gait Ambulation/Gait assistance: Supervision Ambulation Distance (Feet): 50 Feet (x 3) Assistive device: 4-wheeled walker Gait Pattern/deviations: Step-through pattern Gait velocity: decreased   General Gait Details: talking during ambulation, but not overly SOB, stopped to rest on seat of rollator x 2 during ambulation around the unit.  Educated in use of rollator for both walker and seat.   Stairs            Wheelchair Mobility    Modified Rankin (Stroke Patients Only)       Balance     Sitting balance-Leahy Scale: Good       Standing balance-Leahy Scale: Fair Standing balance comment: no LOB, able to move in room with touching furniture occasionally                    Cognition  Arousal/Alertness: Awake/alert Behavior During Therapy: WFL for tasks assessed/performed Overall Cognitive Status: Within Functional Limits for tasks assessed                      Exercises      General Comments        Pertinent Vitals/Pain Pain Assessment: No/denies pain    Home Living                      Prior Function            PT Goals (current goals can now be found in the care plan section) Progress towards PT goals: Progressing toward goals    Frequency       PT Plan Current plan remains appropriate    Co-evaluation             End of Session Equipment Utilized During Treatment: Gait belt;Oxygen Activity Tolerance: Patient tolerated treatment well Patient left: with call bell/phone within reach;in chair     Time: 1210-1237 PT Time Calculation (min) (ACUTE ONLY): 27 min  Charges:  $Gait Training: 23-37 mins                    G Codes:      Reginia Naas 2016/02/12, 1:44 PM Magda Kiel, [place] 2016/02/12

## 2016-01-22 NOTE — Discharge Summary (Signed)
Physician Discharge Summary  [name] MRN: [id number] DOB/AGE: 11-Jul-1951 65 y.o.  PCP: Antony Blackbird, MD   Admit date: 01/17/2016 Discharge date: 01/22/2016  Discharge Diagnoses:     Principal Problem:   Acute on chronic respiratory failure [facility]) Active Problems:   Essential hypertension   COPD GOLD III criteria but 02 dep   COPD with exacerbation (HCC)   Elevated troponin   Hyperglycemia   Tachycardia   Chest pain   Acute exacerbation of chronic obstructive pulmonary disease (COPD) ([place])   Demand ischemia (HCC)   Mitral insufficiency    Follow-up recommendations Follow-up with PCP in 3-5 days , including all  additional recommended appointments as below Follow-up CBC, CMP in 3-5 days Patient to follow-up with PCP and pulmonary Dr. Melvyn Novas      Current Discharge Medication List    START taking these medications   Details  !! guaiFENesin (MUCINEX) 600 MG 12 hr tablet Take 1 tablet (600 mg total) by mouth 2 (two) times daily. Qty: 20 tablet, Refills: 0    predniSONE (DELTASONE) 20 MG tablet Take 1 tablet (20 mg total) by mouth daily with breakfast. Qty: 5 tablet, Refills: 0     !! - Potential duplicate medications found. Please discuss with provider.    CONTINUE these medications which have NOT CHANGED   Details  ADVAIR DISKUS 250-50 MCG/DOSE AEPB Inhale 1 puff into the lungs 2 (two) times daily.    albuterol (PROVENTIL HFA;VENTOLIN HFA) 108 (90 BASE) MCG/ACT inhaler Inhale 2 puffs into the lungs every 4 (four) hours as needed for wheezing or shortness of breath. Qty: 18 g, Refills: 0    albuterol (PROVENTIL) (2.5 MG/3ML) 0.083% nebulizer solution Take 3 mLs (2.5 mg total) by nebulization every 2 (two) hours as needed for wheezing or shortness of breath. Qty: 75 mL, Refills: 0    atenolol (TENORMIN) 25 MG tablet Take 25 mg by mouth daily.    Cyanocobalamin (VITAMIN B-12 PO) Take 1 tablet by mouth daily.    fish oil-omega-3 fatty acids 1000 MG capsule Take  1 g by mouth 2 (two) times daily.    levothyroxine (SYNTHROID, LEVOTHROID) 50 MCG tablet Take 50 mcg by mouth daily.    lovastatin (MEVACOR) 20 MG tablet Take 20 mg by mouth at bedtime.    mirtazapine (REMERON) 15 MG tablet Take 15 mg by mouth at bedtime as needed. sleep    Multiple Vitamin (MULTIVITAMIN WITH MINERALS) TABS tablet Take 1 tablet by mouth daily.    OXYGEN Pt uses 2lpm with sleep and 3 lpm with exertion  DME- [facility]    Tiotropium Bromide-Olodaterol (STIOLTO RESPIMAT) 2.5-2.5 MCG/ACT AERS Inhale 2 puffs into the lungs daily. Qty: 1 Inhaler, Refills: 11    !! guaiFENesin (MUCINEX) 600 MG 12 hr tablet Take 1 tablet (600 mg total) by mouth 2 (two) times daily. Qty: 15 tablet, Refills: 0     !! - Potential duplicate medications found. Please discuss with provider.       Discharge Condition: Stable   Discharge Instructions Get Medicines reviewed and adjusted: Please take all your medications with you for your next visit with your Primary MD  Please request your Primary MD to go over all hospital tests and procedure/radiological results at the follow up, please ask your Primary MD to get all Hospital records sent to his/her office.  If you experience worsening of your admission symptoms, develop shortness of breath, life threatening emergency, suicidal or homicidal thoughts you must seek medical attention immediately by calling  911 or calling your MD immediately if symptoms less severe.  You must read complete instructions/literature along with all the possible adverse reactions/side effects for all the Medicines you take and that have been prescribed to you. Take any new Medicines after you have completely understood and accpet all the possible adverse reactions/side effects.   Do not drive when taking Pain medications.   Do not take more than prescribed Pain, Sleep and Anxiety Medications  Special Instructions: If you have smoked or chewed Tobacco in the last 2 yrs  please stop smoking, stop any regular Alcohol and or any Recreational drug use.  Wear Seat belts while driving.  Please note  You were cared for by a hospitalist during your hospital stay. Once you are discharged, your primary care physician will handle any further medical issues. Please note that NO REFILLS for any discharge medications will be authorized once you are discharged, as it is imperative that you return to your primary care physician (or establish a relationship with a primary care physician if you do not have one) for your aftercare needs so that they can reassess your need for medications and monitor your lab values.  Discharge Instructions    AMB Referral to [facility]    Complete by:  As directed   Reason for consult:  COPD exacerbation; community care follow up  Diagnoses of:  COPD/ Pneumonia  Expected date of contact:  1-3 days (reserved for hospital discharges)  Please assign to community nurse for transition of care calls and assess for home visits. COPD exacerbation. Patient has home oxygen concerns.  Inpatient case manager to follow up.  Questions please call:   Natividad Brood, RN BSN [facility] Liaison  [phone] business mobile phone Toll free office [phone]     Diet - low sodium heart healthy    Complete by:  As directed      Increase activity slowly    Complete by:  As directed             No Known Allergies    Disposition: 01-Home or Self Care   Consults: None     Significant Diagnostic Studies:  Ct Angio Chest Pe W/cm &/or Wo Cm  01/17/2016  CLINICAL DATA:  COPD exacerbation. Hypoxia. Shortness of breath starting today. Sharp substernal chest pain. EXAM: CT ANGIOGRAPHY CHEST WITH CONTRAST TECHNIQUE: Multidetector CT imaging of the chest was performed using the standard protocol during bolus administration of intravenous contrast. Multiplanar CT image reconstructions and MIPs were obtained to evaluate the  vascular anatomy. CONTRAST:  80 mL Isovue 370 COMPARISON:  10/22/2008 FINDINGS: Technically adequate study with good opacification of the central and segmental pulmonary arteries. No focal filling defects demonstrated. No evidence of significant pulmonary embolus. Normal heart size. Normal caliber thoracic aorta. No aortic dissection. Calcifications in the aorta. Great vessel origins are patent although rib there is suggestion of stenosis at the origin of the left subclavian artery. Coronary artery calcifications. No significant lymphadenopathy in the chest. Esophagus is decompressed. Evaluation of lungs is limited due to respiratory motion artifact. There is emphysematous change throughout the lungs. Patchy ground-glass changes demonstrated focally in the right middle lung at 1.4 cm diameter. This could represent tori focus or ground-glass nodule. Follow-up in 3 months suggested. Airways thickening may indicate chronic bronchitis. No pleural effusions. No pneumothorax. Included portions of the upper abdominal organs are grossly unremarkable although visualization is limited due to technique. No destructive bone lesions. Review of the MIP images  confirms the above findings. IMPRESSION: No evidence of significant pulmonary embolus. Emphysematous changes in the lungs. Focal ground-glass opacity in the right middle lung. Three-month follow-up suggested. Electronically Signed   By: Lucienne Capers M.D.   On: 01/17/2016 22:19   Dg Chest Port 1 View  01/17/2016  CLINICAL DATA:  Shortness of breath EXAM: PORTABLE CHEST 1 VIEW COMPARISON:  05/29/2015 chest radiograph. FINDINGS: Stable cardiomediastinal silhouette with normal heart size. No pneumothorax. No pleural effusion. Emphysema. No pulmonary edema. No acute consolidative airspace disease. Hyperinflated lungs. IMPRESSION: No acute cardiopulmonary disease. Stable hyperinflated lungs with emphysema, suggesting COPD. Electronically Signed   By: Ilona Sorrel M.D.   On:  01/17/2016 10:26      Filed Weights   01/19/16 0500 01/21/16 0600 01/22/16 0557  Weight: 44.3 kg (97 lb 10.6 oz) 42.1 kg (92 lb 13 oz) 41.822 kg (92 lb 3.2 oz)     Microbiology: Recent Results (from the past 240 hour(s))  MRSA PCR Screening     Status: None   Collection Time: 01/17/16  4:30 PM  Result Value Ref Range Status   MRSA by PCR NEGATIVE NEGATIVE Final    Comment:        The GeneXpert MRSA Assay (FDA approved for NASAL specimens only), is one component of a comprehensive MRSA colonization surveillance program. It is not intended to diagnose MRSA infection nor to guide or monitor treatment for MRSA infections.   MRSA PCR Screening     Status: None   Collection Time: 01/17/16  8:09 PM  Result Value Ref Range Status   MRSA by PCR NEGATIVE NEGATIVE Final    Comment:        The GeneXpert MRSA Assay (FDA approved for NASAL specimens only), is one component of a comprehensive MRSA colonization surveillance program. It is not intended to diagnose MRSA infection nor to guide or monitor treatment for MRSA infections.        Blood Culture    Component Value Date/Time   SDES URINE, CLEAN CATCH 10/20/2013 1318   SDES URINE, CLEAN CATCH 10/20/2013 1318   SPECREQUEST NONE 10/20/2013 1318   SPECREQUEST NONE 10/20/2013 1318   CULT NO GROWTH Performed at [facility] 10/20/2013 1318   REPTSTATUS 10/21/2013 FINAL 10/20/2013 1318   REPTSTATUS 10/21/2013 FINAL 10/20/2013 1318      Labs: Results for orders placed or performed during the hospital encounter of 01/17/16 (from the past 48 hour(s))  Glucose, capillary     Status: Abnormal   Collection Time: 01/20/16 12:04 PM  Result Value Ref Range   Glucose-Capillary 122 (H) 65 - 99 mg/dL  Glucose, capillary     Status: Abnormal   Collection Time: 01/20/16  4:13 PM  Result Value Ref Range   Glucose-Capillary 181 (H) 65 - 99 mg/dL  Glucose, capillary     Status: Abnormal   Collection Time: 01/20/16 10:04  PM  Result Value Ref Range   Glucose-Capillary 197 (H) 65 - 99 mg/dL  CBC     Status: Abnormal   Collection Time: 01/21/16  5:10 AM  Result Value Ref Range   WBC 10.4 4.0 - 10.5 K/uL   RBC 3.94 3.87 - 5.11 MIL/uL   Hemoglobin 11.4 (L) 12.0 - 15.0 g/dL   HCT 35.1 (L) 36.0 - 46.0 %   MCV 89.1 78.0 - 100.0 fL   MCH 28.9 26.0 - 34.0 pg   MCHC 32.5 30.0 - 36.0 g/dL   RDW 14.1 11.5 - 15.5 %   Platelets  294 150 - 400 K/uL  Basic metabolic panel     Status: Abnormal   Collection Time: 01/21/16  5:10 AM  Result Value Ref Range   Sodium 144 135 - 145 mmol/L   Potassium 4.1 3.5 - 5.1 mmol/L   Chloride 101 101 - 111 mmol/L   CO2 31 22 - 32 mmol/L   Glucose, Bld 158 (H) 65 - 99 mg/dL   BUN 16 6 - 20 mg/dL   Creatinine, Ser 0.53 0.44 - 1.00 mg/dL   Calcium 9.4 8.9 - 10.3 mg/dL   GFR calc non Af Amer >60 >60 mL/min   GFR calc Af Amer >60 >60 mL/min    Comment: (NOTE) The eGFR has been calculated using the CKD EPI equation. This calculation has not been validated in all clinical situations. eGFR's persistently <60 mL/min signify possible Chronic Kidney Disease.    Anion gap 12 5 - 15  Glucose, capillary     Status: Abnormal   Collection Time: 01/21/16  7:42 AM  Result Value Ref Range   Glucose-Capillary 146 (H) 65 - 99 mg/dL  Glucose, capillary     Status: Abnormal   Collection Time: 01/21/16 11:56 AM  Result Value Ref Range   Glucose-Capillary 207 (H) 65 - 99 mg/dL  Glucose, capillary     Status: Abnormal   Collection Time: 01/21/16  4:21 PM  Result Value Ref Range   Glucose-Capillary 175 (H) 65 - 99 mg/dL   Comment 1 Notify RN    Comment 2 Document in Chart   Glucose, capillary     Status: Abnormal   Collection Time: 01/21/16 10:16 PM  Result Value Ref Range   Glucose-Capillary 147 (H) 65 - 99 mg/dL   Comment 1 Notify RN    Comment 2 Document in Chart   CBC     Status: Abnormal   Collection Time: 01/22/16  6:03 AM  Result Value Ref Range   WBC 11.1 (H) 4.0 - 10.5 K/uL    RBC 4.16 3.87 - 5.11 MIL/uL   Hemoglobin 12.0 12.0 - 15.0 g/dL   HCT 36.9 36.0 - 46.0 %   MCV 88.7 78.0 - 100.0 fL   MCH 28.8 26.0 - 34.0 pg   MCHC 32.5 30.0 - 36.0 g/dL   RDW 13.9 11.5 - 15.5 %   Platelets 365 150 - 400 K/uL  Glucose, capillary     Status: Abnormal   Collection Time: 01/22/16  7:50 AM  Result Value Ref Range   Glucose-Capillary 149 (H) 65 - 99 mg/dL     Lipid Panel  No results found for: CHOL, TRIG, HDL, CHOLHDL, VLDL, LDLCALC, LDLDIRECT   No results found for: HGBA1C   Lab Results  Component Value Date   CREATININE 0.53 01/21/2016     HPI : 65 y.o. female with a past medical history that includes COPD on home oxygen, chronic bronchitis, hypothyroidism, hypertension presents emergency department with chief complaint persistent worsening shortness of breath and cough. Initial evaluation reveals acute on chronic respiratory failure likely related to COPD exacerbation  Information is obtained from the family at the bedside as patient is on BiPAP quite short of breath. She developed worsening shortness of breath yesterday. Associated symptoms include nasal congestion and worsening cough with yellow-white sputum as well as diffuse lower chest pain. She describes the pain as intermittent and ache 2-4 out of 10. She used her nebulizers at home with no relief. She denies or extremity edema fever chills nausea vomiting dysuria hematuria frequency  or urgency. EMS was called was provided with albuterol nebulizer as well as 125 mg of Solu-Medrol.  In the emergency department she is afebrile hemodynamically stable with mild tachycardia. She was started on BiPAP due to her increased work of breathing.   HOSPITAL COURSE:   Acute on chronic respiratory failure with hypoxia, oxygen dependent at baseline 2 L via [place] ([place]) - due to acute exacerbation of COPD - pt reports feeling better this AM, still with wheezing and poor air movement overall  - unable to taper off solumedrol  yet, will keep on it for additional 24 hours and reassess in AM - today is day #5/5 of Levaquin, stop after today's dose  CT chest negative for pulmonary embolism    Elevated troponins - now trending down, pt denies chest pain this AM - per cardiology, no plan to proceed with any intervention or stress test at this time, will be done in an outpatient setting    Essential hypertension - reasonable inpatient control    Hyperglycemia - steroid induced     Leukocytosis - steroid induced, resolved    Underweight, severe PCM, FTT  - Body mass index is 17.5 kg/(m^2) - nutritionist consulted, assistance and recommendations are appreciated  - high risk fall as pt looks fragile and with COPD oxygen dependent , on 3 L at home during the day - PT evaluation completed, no further follow up recommended    Hypothyroidism - TSH low, will lower the dose of synthroid from 75 mcg to 50 mcg - will need repeat TSH in 6 weeks    Discharge Exam:    Blood pressure 153/74, pulse 89, temperature 97.8 F (36.6 C), temperature source Oral, resp. rate 18, height 5' (1.524 m), weight 41.822 kg (92 lb 3.2 oz), SpO2 98 %. General exam: Appears calm and comfortable  Respiratory system: expiratory wheezing bilaterally and course breath sounds  Cardiovascular system: S1 & S2 heard, RRR. No JVD, murmurs, rubs, gallops or clicks. No pedal edema. Gastrointestinal system: Abdomen is nondistended, soft and nontender.  Central nervous system: Alert and oriented. No focal neurological deficits. Extremities: Symmetric 5 x 5 power. Psychiatry: Judgement and insight appear normal. Mood & affect appropriate.       Follow-up Information    Follow up with [facility].   Why:  HHRN for COPD program   Contact information:   [address] [place] [address] [phone]       Follow up with [facility].   Contact information:   [place]       Follow up with FULP, CAMMIE, MD. Schedule an appointment as soon as possible for a visit in 3 days.   Specialty:  Family Medicine   Why:  Hospital follow-up   Contact information:   [address] [address] [phone]       Follow up with Christinia Gully, MD. Schedule an appointment as soon as possible for a visit in 3 days.   Specialty:  Pulmonary Disease   Contact information:   [address] [address] [phone]       Signed: Reyne Dumas 01/22/2016, 11:30 AM        Time spent >45 mins

## 2016-01-22 NOTE — Progress Notes (Signed)
[name] discharged Home per MD order.  Discharge instructions reviewed and discussed with the patient, all questions and concerns answered. Copy of instructions and care note for diagnosis and new medications given to patient.    Medication List    TAKE these medications        ADVAIR DISKUS 250-50 MCG/DOSE Aepb  Generic drug:  Fluticasone-Salmeterol  Inhale 1 puff into the lungs 2 (two) times daily.     albuterol 108 (90 Base) MCG/ACT inhaler  Commonly known as:  PROVENTIL HFA;VENTOLIN HFA  Inhale 2 puffs into the lungs every 4 (four) hours as needed for wheezing or shortness of breath.     albuterol (2.5 MG/3ML) 0.083% nebulizer solution  Commonly known as:  PROVENTIL  Take 3 mLs (2.5 mg total) by nebulization every 2 (two) hours as needed for wheezing or shortness of breath.     atenolol 25 MG tablet  Commonly known as:  TENORMIN  Take 25 mg by mouth daily.     fish oil-omega-3 fatty acids 1000 MG capsule  Take 1 g by mouth 2 (two) times daily.     guaiFENesin 600 MG 12 hr tablet  Commonly known as:  MUCINEX  Take 1 tablet (600 mg total) by mouth 2 (two) times daily.     guaiFENesin 600 MG 12 hr tablet  Commonly known as:  MUCINEX  Take 1 tablet (600 mg total) by mouth 2 (two) times daily.     levothyroxine 50 MCG tablet  Commonly known as:  SYNTHROID, LEVOTHROID  Take 50 mcg by mouth daily.     lovastatin 20 MG tablet  Commonly known as:  MEVACOR  Take 20 mg by mouth at bedtime.     mirtazapine 15 MG tablet  Commonly known as:  REMERON  Take 15 mg by mouth at bedtime as needed. sleep     multivitamin with minerals Tabs tablet  Take 1 tablet by mouth daily.     OXYGEN  Pt uses 2lpm with sleep and 3 lpm with exertion  DME- [facility]     predniSONE 20 MG tablet  Commonly known as:  DELTASONE  Take 1 tablet (20 mg total) by mouth daily with breakfast.     Tiotropium Bromide-Olodaterol 2.5-2.5 MCG/ACT Aers  Commonly known as:  STIOLTO RESPIMAT  Inhale 2  puffs into the lungs daily.     VITAMIN B-12 PO  Take 1 tablet by mouth daily.        Patients skin is clean, dry and intact, no evidence of skin break down. IV site discontinued and catheter remains intact. Site without signs and symptoms of complications. Dressing and pressure applied.  Patient escorted to car by NT in a wheelchair,  no distress noted upon discharge.  Panagiotis Oelkers C 01/22/2016 1:20 PM

## 2016-01-23 DIAGNOSIS — E785 Hyperlipidemia, unspecified: Secondary | ICD-10-CM | POA: Diagnosis not present

## 2016-01-23 DIAGNOSIS — J9620 Acute and chronic respiratory failure, unspecified whether with hypoxia or hypercapnia: Secondary | ICD-10-CM | POA: Diagnosis not present

## 2016-01-23 DIAGNOSIS — Z87891 Personal history of nicotine dependence: Secondary | ICD-10-CM | POA: Diagnosis not present

## 2016-01-23 DIAGNOSIS — J441 Chronic obstructive pulmonary disease with (acute) exacerbation: Secondary | ICD-10-CM | POA: Diagnosis not present

## 2016-01-23 DIAGNOSIS — E039 Hypothyroidism, unspecified: Secondary | ICD-10-CM | POA: Diagnosis not present

## 2016-01-23 DIAGNOSIS — I10 Essential (primary) hypertension: Secondary | ICD-10-CM | POA: Diagnosis not present

## 2016-01-25 ENCOUNTER — Other Ambulatory Visit: Payer: Self-pay

## 2016-01-25 DIAGNOSIS — I10 Essential (primary) hypertension: Secondary | ICD-10-CM | POA: Diagnosis not present

## 2016-01-25 DIAGNOSIS — Z87891 Personal history of nicotine dependence: Secondary | ICD-10-CM | POA: Diagnosis not present

## 2016-01-25 DIAGNOSIS — J9620 Acute and chronic respiratory failure, unspecified whether with hypoxia or hypercapnia: Secondary | ICD-10-CM | POA: Diagnosis not present

## 2016-01-25 DIAGNOSIS — E785 Hyperlipidemia, unspecified: Secondary | ICD-10-CM | POA: Diagnosis not present

## 2016-01-25 DIAGNOSIS — J441 Chronic obstructive pulmonary disease with (acute) exacerbation: Secondary | ICD-10-CM | POA: Diagnosis not present

## 2016-01-25 DIAGNOSIS — E039 Hypothyroidism, unspecified: Secondary | ICD-10-CM | POA: Diagnosis not present

## 2016-01-25 NOTE — Patient Outreach (Signed)
Initial telephone contact made for community care coordination. Patient identified herself by providing date of birth and address.  Patient readily agreed to assessment of community care coordination. Patient and RNCM collaborated to form her community case management care plan.  Plan: Home visit for community care coordination and COPD education.

## 2016-01-27 DIAGNOSIS — J441 Chronic obstructive pulmonary disease with (acute) exacerbation: Secondary | ICD-10-CM | POA: Diagnosis not present

## 2016-01-27 DIAGNOSIS — J9620 Acute and chronic respiratory failure, unspecified whether with hypoxia or hypercapnia: Secondary | ICD-10-CM | POA: Diagnosis not present

## 2016-01-27 DIAGNOSIS — Z87891 Personal history of nicotine dependence: Secondary | ICD-10-CM | POA: Diagnosis not present

## 2016-01-27 DIAGNOSIS — E785 Hyperlipidemia, unspecified: Secondary | ICD-10-CM | POA: Diagnosis not present

## 2016-01-27 DIAGNOSIS — E039 Hypothyroidism, unspecified: Secondary | ICD-10-CM | POA: Diagnosis not present

## 2016-01-27 DIAGNOSIS — I10 Essential (primary) hypertension: Secondary | ICD-10-CM | POA: Diagnosis not present

## 2016-01-28 NOTE — Patient Outreach (Signed)
Initial transition of care completed after patient identified herself by providing date of birth and address. Patient and this RNCM collaborated for create her case management care plan goals.  Home visit scheduled for next week

## 2016-01-29 DIAGNOSIS — E46 Unspecified protein-calorie malnutrition: Secondary | ICD-10-CM | POA: Diagnosis not present

## 2016-01-29 DIAGNOSIS — I10 Essential (primary) hypertension: Secondary | ICD-10-CM | POA: Diagnosis not present

## 2016-01-29 DIAGNOSIS — E039 Hypothyroidism, unspecified: Secondary | ICD-10-CM | POA: Diagnosis not present

## 2016-01-29 DIAGNOSIS — R739 Hyperglycemia, unspecified: Secondary | ICD-10-CM | POA: Diagnosis not present

## 2016-01-29 DIAGNOSIS — J449 Chronic obstructive pulmonary disease, unspecified: Secondary | ICD-10-CM | POA: Diagnosis not present

## 2016-01-29 DIAGNOSIS — J9620 Acute and chronic respiratory failure, unspecified whether with hypoxia or hypercapnia: Secondary | ICD-10-CM | POA: Diagnosis not present

## 2016-01-29 DIAGNOSIS — E785 Hyperlipidemia, unspecified: Secondary | ICD-10-CM | POA: Diagnosis not present

## 2016-01-29 DIAGNOSIS — D72829 Elevated white blood cell count, unspecified: Secondary | ICD-10-CM | POA: Diagnosis not present

## 2016-01-29 DIAGNOSIS — Z87891 Personal history of nicotine dependence: Secondary | ICD-10-CM | POA: Diagnosis not present

## 2016-01-29 DIAGNOSIS — J441 Chronic obstructive pulmonary disease with (acute) exacerbation: Secondary | ICD-10-CM | POA: Diagnosis not present

## 2016-02-01 DIAGNOSIS — J9620 Acute and chronic respiratory failure, unspecified whether with hypoxia or hypercapnia: Secondary | ICD-10-CM | POA: Diagnosis not present

## 2016-02-01 DIAGNOSIS — E785 Hyperlipidemia, unspecified: Secondary | ICD-10-CM | POA: Diagnosis not present

## 2016-02-01 DIAGNOSIS — I10 Essential (primary) hypertension: Secondary | ICD-10-CM | POA: Diagnosis not present

## 2016-02-01 DIAGNOSIS — E039 Hypothyroidism, unspecified: Secondary | ICD-10-CM | POA: Diagnosis not present

## 2016-02-01 DIAGNOSIS — J441 Chronic obstructive pulmonary disease with (acute) exacerbation: Secondary | ICD-10-CM | POA: Diagnosis not present

## 2016-02-01 DIAGNOSIS — Z87891 Personal history of nicotine dependence: Secondary | ICD-10-CM | POA: Diagnosis not present

## 2016-02-02 DIAGNOSIS — Z87891 Personal history of nicotine dependence: Secondary | ICD-10-CM | POA: Diagnosis not present

## 2016-02-02 DIAGNOSIS — E785 Hyperlipidemia, unspecified: Secondary | ICD-10-CM | POA: Diagnosis not present

## 2016-02-02 DIAGNOSIS — E039 Hypothyroidism, unspecified: Secondary | ICD-10-CM | POA: Diagnosis not present

## 2016-02-02 DIAGNOSIS — J441 Chronic obstructive pulmonary disease with (acute) exacerbation: Secondary | ICD-10-CM | POA: Diagnosis not present

## 2016-02-02 DIAGNOSIS — I10 Essential (primary) hypertension: Secondary | ICD-10-CM | POA: Diagnosis not present

## 2016-02-02 DIAGNOSIS — J9620 Acute and chronic respiratory failure, unspecified whether with hypoxia or hypercapnia: Secondary | ICD-10-CM | POA: Diagnosis not present

## 2016-02-03 DIAGNOSIS — E785 Hyperlipidemia, unspecified: Secondary | ICD-10-CM | POA: Diagnosis not present

## 2016-02-03 DIAGNOSIS — I10 Essential (primary) hypertension: Secondary | ICD-10-CM | POA: Diagnosis not present

## 2016-02-03 DIAGNOSIS — J441 Chronic obstructive pulmonary disease with (acute) exacerbation: Secondary | ICD-10-CM | POA: Diagnosis not present

## 2016-02-03 DIAGNOSIS — E039 Hypothyroidism, unspecified: Secondary | ICD-10-CM | POA: Diagnosis not present

## 2016-02-03 DIAGNOSIS — Z87891 Personal history of nicotine dependence: Secondary | ICD-10-CM | POA: Diagnosis not present

## 2016-02-03 DIAGNOSIS — J9620 Acute and chronic respiratory failure, unspecified whether with hypoxia or hypercapnia: Secondary | ICD-10-CM | POA: Diagnosis not present

## 2016-02-04 ENCOUNTER — Other Ambulatory Visit: Payer: Self-pay

## 2016-02-05 DIAGNOSIS — J9620 Acute and chronic respiratory failure, unspecified whether with hypoxia or hypercapnia: Secondary | ICD-10-CM | POA: Diagnosis not present

## 2016-02-05 DIAGNOSIS — J441 Chronic obstructive pulmonary disease with (acute) exacerbation: Secondary | ICD-10-CM | POA: Diagnosis not present

## 2016-02-05 DIAGNOSIS — I10 Essential (primary) hypertension: Secondary | ICD-10-CM | POA: Diagnosis not present

## 2016-02-05 DIAGNOSIS — E039 Hypothyroidism, unspecified: Secondary | ICD-10-CM | POA: Diagnosis not present

## 2016-02-05 DIAGNOSIS — Z87891 Personal history of nicotine dependence: Secondary | ICD-10-CM | POA: Diagnosis not present

## 2016-02-05 DIAGNOSIS — E785 Hyperlipidemia, unspecified: Secondary | ICD-10-CM | POA: Diagnosis not present

## 2016-02-05 NOTE — Patient Outreach (Signed)
[facility]) Care Management  Feb 04, 2016    [name] [birth year] [id number]    Patient and this RNCM met for this initial home visit for community care coordination. Patient was receptive to this assessment of needs for community care coordination.   Findings: Patient has very good family support, lives in home visit sister. Home is a single family dwelling [address]. Patient is on continuous oxygen at 2 liters per minute via nasal cannula.  Pateint states she increased the oxygen to 3 liters when she goes out.    Patient states she is very involved in her church, [employer] on [place]. Here in [place], [place]  Patient's oxygen and nebulizer is provided and serviced by [place], [place]. Patient is independent with ADLs and IADLs, she states she takes many breaks during activities.  Patient confirms getting tired very easily. Patient and this RNCM agreed to telephone contact and home visit later this month.

## 2016-02-08 ENCOUNTER — Ambulatory Visit (INDEPENDENT_AMBULATORY_CARE_PROVIDER_SITE_OTHER): Payer: Medicare Other | Admitting: Adult Health

## 2016-02-08 ENCOUNTER — Ambulatory Visit (INDEPENDENT_AMBULATORY_CARE_PROVIDER_SITE_OTHER)
Admission: RE | Admit: 2016-02-08 | Discharge: 2016-02-08 | Disposition: A | Discharge status: Home / Self Care | Payer: Medicare Other | Source: Ambulatory Visit | Attending: Adult Health | Admitting: Adult Health

## 2016-02-08 ENCOUNTER — Encounter: Payer: Self-pay | Admitting: Adult Health

## 2016-02-08 VITALS — BP 146/78 | HR 63 | Temp 97.5°F | Ht 60.0 in | Wt 94.0 lb

## 2016-02-08 DIAGNOSIS — J449 Chronic obstructive pulmonary disease, unspecified: Secondary | ICD-10-CM

## 2016-02-08 DIAGNOSIS — J9611 Chronic respiratory failure with hypoxia: Secondary | ICD-10-CM | POA: Diagnosis not present

## 2016-02-08 DIAGNOSIS — R911 Solitary pulmonary nodule: Secondary | ICD-10-CM

## 2016-02-08 MED ORDER — TIOTROPIUM BROMIDE-OLODATEROL 2.5-2.5 MCG/ACT IN AERS: 2.0000 | INHALATION_SPRAY | Freq: Every day | RESPIRATORY_TRACT | Status: DC

## 2016-02-08 NOTE — Patient Instructions (Addendum)
Check cxr today .  Stop Advair and Spiriva  Restart Stiolto daily, rinse after use.  Set up CT chest in  2 months  Follow up Dr. Melvyn Novas  In 2 months (after CT chest )  Please contact office for sooner follow up if symptoms do not improve or worsen or seek emergency care

## 2016-02-08 NOTE — Assessment & Plan Note (Signed)
CT chest 01/2016 >Patchyground-glass changes demonstrated focally in the right middle lungat 1.4 cm diameter>repeat CT chest in 04/2016

## 2016-02-08 NOTE — Assessment & Plan Note (Signed)
Cont on O2 .  

## 2016-02-08 NOTE — Progress Notes (Signed)
Chart and office note reviewed in detail along with available xrays/ labs > agree with a/p as outlined  

## 2016-02-08 NOTE — Assessment & Plan Note (Signed)
Recent exacerbation now resolved   Plan  Check cxr today .  Stop Advair and Spiriva  Restart Stiolto daily, rinse after use.  Set up CT chest in  2 months  Follow up Dr. Melvyn Novas  In 2 months (after CT chest )  Please contact office for sooner follow up if symptoms do not improve or worsen or seek emergency care

## 2016-02-08 NOTE — Progress Notes (Signed)
Subjective:    [name], female    DOB: 1951-07-26   MRN: [id number]  HPI 11/24/2011 - Initial oV  PCP - Meriel Pica   63/F, ex smoker with severe COPD for FU.   Significant tests/ events  She was seen by Dr Gwenette Greet 12/24/08 after hospitalization for LUL pna. CT angio 3/10 showed centrilobular emphysema.She was placed on oxygen around this time [facility] pt) .  PFTs  12/2008 FEv1 39%, DLCO 35%, mild air trapping    07/17/2014  Chief Complaint  Patient presents with  . Emphysema    follow-up. Pt states SOB is not any worse but family member states SOB is worse with activity.    Here for recertification of O2   feels portable tank is too heavy & hurts her shoulder  CMN received from [place] pt. She describes a less than one block doe at a moderate pace on flat ground. She will get winded bringing groceries in from the car. She can climb a flight of stairs at her own pace.  She is compliant with nocturnal O2.She reports a 'smoker's cough' with minimal sticky phlegm  She desaturated to 76% on1 lap on RA, required 3L o2 to maintain satn Dropped wt from 100  to 95 lbs over 1 yr   01/27/2015 Follow up COPD/ O2 dependent RF  Pt returns for 6 month follow up .  Overall says she is doing well on Advair and Spiriva  No flare in cough or dyspnea.  On O2 at 3 L .  Plans to go to [facility] to see family , discussed need for portable concentrator.  No ER /hospital admits.  PVX and Prevnar utd No chest pain, orthopnea, edema , hemoptysis, or fever.  Does have more nasal drainage with pollen . No discolored mucus.  Discussed claritin As needed   rec Stay on advair & spiriva If you fly to [place] will need portable concentrator   08/28/2015 acute extended ov/Wert re: GOLD III copd/ on spiriva and freq saba and 02 2lpm hs/ 3lpm with activity  Chief Complaint  Patient presents with  . Acute Visit    Pt c/o increased SOB for the past few wks- "I have attacks"-  relates to the weather change. She takes albuterol inhaler 2 x per day on average and uses neb 1-2 x per day.   baseline doe = MMRC2 = can't walk a nl pace on a flat grade s sob  >>Stiolto rx   02/08/2016 [facility] follow up  Patient returns for a post hospital follow-up . Patient was admitted 01/17/2016 for COPD exacerbation with acute on chronic respiratory failure She did require initial BiPAP support. She was treated with IV antibiotics, steroids and nebulized bronchodilators. CT chest was negative for pulmonary embolism, showed emphysematous changes and a focal groundglass opacity in RML . Will need follow up CT in 3 months.  Since discharge. Patient is feeling better with less cough and dyspnea.  She is on Advair and Spiriva . Was previously on Stiolto . Wants to go back on Stiolto.  She denies any chest pain, orthopnea, PND, or increased leg swelling. On oxygen 2l/m rest and 3l/m w/ act.  Says her PCP has extended her on prednisone , unsure of dose.  She says she will bring bottle to next visit. Says her appetite is much better.  PVX and Prevnar utd.    Current Medications, Allergies, Complete Past Medical History, Past Surgical History, Family History, and  Social History were reviewed in [employer] record.  ROS  The following are not active complaints unless bolded sore throat, dysphagia, dental problems, itching, sneezing,  nasal congestion or excess/ purulent secretions, ear ache,   fever, chills, sweats, unintended wt loss, classically pleuritic or exertional cp, hemoptysis,  orthopnea pnd or leg swelling, presyncope, palpitations, abdominal pain, anorexia, nausea, vomiting, diarrhea  or change in bowel or bladder habits, change in stools or urine, dysuria,hematuria,  rash, arthralgias, visual complaints, headache, numbness, weakness or ataxia or problems with walking or coordination,  change in mood/affect or memory.                   Objective:     Physical Exam Filed Vitals:   02/08/16 1152  BP: 146/78  Pulse: 63  Temp: 97.5 F (36.4 C)  TempSrc: Oral  Height: 5' (1.524 m)  Weight: 94 lb (42.638 kg)  SpO2: 100%      Vital signs reviewed  Gen. Pleasant, thin woman, in no distress ENT - no lesions, no post nasal drip Neck: No JVD, no thyromegaly, no carotid bruits Lungs: no use of accessory muscles, no dullness to percussion, decreased without rales or rhonchi  Cardiovascular: Rhythm regular, heart sounds  normal, no murmurs or gallops, no peripheral edema Musculoskeletal: No deformities, no cyanosis or clubbing   Zaila Crew NP-C  [facility]  02/08/2016       Assessment & Plan:

## 2016-02-09 ENCOUNTER — Other Ambulatory Visit: Payer: Medicare Other

## 2016-02-09 DIAGNOSIS — J9620 Acute and chronic respiratory failure, unspecified whether with hypoxia or hypercapnia: Secondary | ICD-10-CM | POA: Diagnosis not present

## 2016-02-09 DIAGNOSIS — E039 Hypothyroidism, unspecified: Secondary | ICD-10-CM | POA: Diagnosis not present

## 2016-02-09 DIAGNOSIS — E785 Hyperlipidemia, unspecified: Secondary | ICD-10-CM | POA: Diagnosis not present

## 2016-02-09 DIAGNOSIS — J441 Chronic obstructive pulmonary disease with (acute) exacerbation: Secondary | ICD-10-CM | POA: Diagnosis not present

## 2016-02-09 DIAGNOSIS — Z87891 Personal history of nicotine dependence: Secondary | ICD-10-CM | POA: Diagnosis not present

## 2016-02-09 DIAGNOSIS — I10 Essential (primary) hypertension: Secondary | ICD-10-CM | POA: Diagnosis not present

## 2016-02-10 ENCOUNTER — Other Ambulatory Visit: Payer: Self-pay

## 2016-02-10 ENCOUNTER — Telehealth: Payer: Self-pay | Admitting: Adult Health

## 2016-02-10 DIAGNOSIS — J9620 Acute and chronic respiratory failure, unspecified whether with hypoxia or hypercapnia: Secondary | ICD-10-CM | POA: Diagnosis not present

## 2016-02-10 DIAGNOSIS — E039 Hypothyroidism, unspecified: Secondary | ICD-10-CM | POA: Diagnosis not present

## 2016-02-10 DIAGNOSIS — Z87891 Personal history of nicotine dependence: Secondary | ICD-10-CM | POA: Diagnosis not present

## 2016-02-10 DIAGNOSIS — E785 Hyperlipidemia, unspecified: Secondary | ICD-10-CM | POA: Diagnosis not present

## 2016-02-10 DIAGNOSIS — I10 Essential (primary) hypertension: Secondary | ICD-10-CM | POA: Diagnosis not present

## 2016-02-10 DIAGNOSIS — J441 Chronic obstructive pulmonary disease with (acute) exacerbation: Secondary | ICD-10-CM | POA: Diagnosis not present

## 2016-02-10 NOTE — Patient Outreach (Signed)
Call made to patient for assessment of community care coordination needs. patient identified herself by providing date of birth and address.  Patient and this RNCM reviewed her case management care plan, goals updated. patient states she is feeling much better, is able to talk with noted shortness of breath.  Patient states her endurance as improved quite a bit in that she is able to walk without extra oxygen. Patient states she plans to start attending church again as soon as she gets the green light from her doctor.  Findings: Patient is being followed by home care for [facility], PT/OT Patient is dependent on oxygen at 2 liters per minute via nasal cannula, she increases it to 3 liters when she goes out. Patient lives in home with her sister and nieces. Patient likes to bake cakes and other deserts to keep her mind occupied.  Plan: Home visit next week for community care coordination.

## 2016-02-10 NOTE — Progress Notes (Signed)
Quick Note:  Called spoke with pt. Reviewed results and recs. Pt states that her insurance will not cover the stiolto that was given at there ov. I explained to her that I would contact the pharmacy and discuss this with TP. Pt voiced understanding and had no further questions. ______

## 2016-02-10 NOTE — Progress Notes (Signed)
Quick Note:  LVM for pt to return call ______ 

## 2016-02-10 NOTE — Telephone Encounter (Signed)
Notes Recorded by Osa Craver, CMA on 02/10/2016 at 10:40 AM Called spoke with pt. Reviewed results and recs. Pt states that her insurance will not cover the stiolto that was given at there ov. I explained to her that I would contact the pharmacy and discuss this with TP. Pt voiced understanding and had no further questions.  I called [facility] and spoke with the pharmacy Tech. She states that the stiolto will cost $300.89 due to not being coved by her medicare. She states that she will fax over the PA form to [phone]. She voiced understanding and had no further questions. Will await fax

## 2016-02-11 DIAGNOSIS — Z87891 Personal history of nicotine dependence: Secondary | ICD-10-CM | POA: Diagnosis not present

## 2016-02-11 DIAGNOSIS — J441 Chronic obstructive pulmonary disease with (acute) exacerbation: Secondary | ICD-10-CM | POA: Diagnosis not present

## 2016-02-11 DIAGNOSIS — E785 Hyperlipidemia, unspecified: Secondary | ICD-10-CM | POA: Diagnosis not present

## 2016-02-11 DIAGNOSIS — E039 Hypothyroidism, unspecified: Secondary | ICD-10-CM | POA: Diagnosis not present

## 2016-02-11 DIAGNOSIS — I10 Essential (primary) hypertension: Secondary | ICD-10-CM | POA: Diagnosis not present

## 2016-02-11 DIAGNOSIS — J9620 Acute and chronic respiratory failure, unspecified whether with hypoxia or hypercapnia: Secondary | ICD-10-CM | POA: Diagnosis not present

## 2016-02-12 DIAGNOSIS — I10 Essential (primary) hypertension: Secondary | ICD-10-CM | POA: Diagnosis not present

## 2016-02-12 DIAGNOSIS — E039 Hypothyroidism, unspecified: Secondary | ICD-10-CM | POA: Diagnosis not present

## 2016-02-12 DIAGNOSIS — E785 Hyperlipidemia, unspecified: Secondary | ICD-10-CM | POA: Diagnosis not present

## 2016-02-12 DIAGNOSIS — J441 Chronic obstructive pulmonary disease with (acute) exacerbation: Secondary | ICD-10-CM | POA: Diagnosis not present

## 2016-02-12 DIAGNOSIS — J9620 Acute and chronic respiratory failure, unspecified whether with hypoxia or hypercapnia: Secondary | ICD-10-CM | POA: Diagnosis not present

## 2016-02-12 DIAGNOSIS — Z87891 Personal history of nicotine dependence: Secondary | ICD-10-CM | POA: Diagnosis not present

## 2016-02-15 DIAGNOSIS — E039 Hypothyroidism, unspecified: Secondary | ICD-10-CM | POA: Diagnosis not present

## 2016-02-15 DIAGNOSIS — J441 Chronic obstructive pulmonary disease with (acute) exacerbation: Secondary | ICD-10-CM | POA: Diagnosis not present

## 2016-02-15 DIAGNOSIS — E785 Hyperlipidemia, unspecified: Secondary | ICD-10-CM | POA: Diagnosis not present

## 2016-02-15 DIAGNOSIS — J9620 Acute and chronic respiratory failure, unspecified whether with hypoxia or hypercapnia: Secondary | ICD-10-CM | POA: Diagnosis not present

## 2016-02-15 DIAGNOSIS — I10 Essential (primary) hypertension: Secondary | ICD-10-CM | POA: Diagnosis not present

## 2016-02-15 DIAGNOSIS — Z87891 Personal history of nicotine dependence: Secondary | ICD-10-CM | POA: Diagnosis not present

## 2016-02-16 NOTE — Telephone Encounter (Signed)
Called [phone] and spoke with [place]. She states that the pharmacy ran the prescription as 4 inhalers for a 30 days supply. I explained to her that the prescription was written for 1 inhaler for a 30 day supply with 5 refills. She states that there is a requirement that the patient must have tried and failed Combivent, Anoro, or Bevespi. She states that she will start the PA process and we should hear a determination within the next 3 days.    Case #: [id number]   TP please advise if you would like to try alternatives

## 2016-02-17 DIAGNOSIS — J441 Chronic obstructive pulmonary disease with (acute) exacerbation: Secondary | ICD-10-CM | POA: Diagnosis not present

## 2016-02-17 DIAGNOSIS — I10 Essential (primary) hypertension: Secondary | ICD-10-CM | POA: Diagnosis not present

## 2016-02-17 DIAGNOSIS — E039 Hypothyroidism, unspecified: Secondary | ICD-10-CM | POA: Diagnosis not present

## 2016-02-17 DIAGNOSIS — E785 Hyperlipidemia, unspecified: Secondary | ICD-10-CM | POA: Diagnosis not present

## 2016-02-17 DIAGNOSIS — J9620 Acute and chronic respiratory failure, unspecified whether with hypoxia or hypercapnia: Secondary | ICD-10-CM | POA: Diagnosis not present

## 2016-02-17 DIAGNOSIS — Z87891 Personal history of nicotine dependence: Secondary | ICD-10-CM | POA: Diagnosis not present

## 2016-02-17 MED ORDER — UMECLIDINIUM-VILANTEROL 62.5-25 MCG/INH IN AEPB: 1.0000 | INHALATION_SPRAY | Freq: Every day | RESPIRATORY_TRACT | Status: DC

## 2016-02-17 NOTE — Telephone Encounter (Signed)
Patient returned call, CB is [phone]

## 2016-02-17 NOTE — Telephone Encounter (Signed)
May use ANORO -very similar drug to [employer] .  ANORO #1 1 puff daily #1 , 5 refills Cont w/ ov recs

## 2016-02-17 NOTE — Telephone Encounter (Signed)
Spoke with pt and gave TP's recommendation to change to Anoro. Pt agrees. Rx sent. Nothing further needed.

## 2016-02-17 NOTE — Telephone Encounter (Signed)
lmtcb x1 for pt. 

## 2016-02-18 ENCOUNTER — Other Ambulatory Visit: Payer: Self-pay

## 2016-02-18 DIAGNOSIS — E039 Hypothyroidism, unspecified: Secondary | ICD-10-CM | POA: Diagnosis not present

## 2016-02-18 DIAGNOSIS — E785 Hyperlipidemia, unspecified: Secondary | ICD-10-CM | POA: Diagnosis not present

## 2016-02-18 DIAGNOSIS — Z87891 Personal history of nicotine dependence: Secondary | ICD-10-CM | POA: Diagnosis not present

## 2016-02-18 DIAGNOSIS — J441 Chronic obstructive pulmonary disease with (acute) exacerbation: Secondary | ICD-10-CM | POA: Diagnosis not present

## 2016-02-18 DIAGNOSIS — I10 Essential (primary) hypertension: Secondary | ICD-10-CM | POA: Diagnosis not present

## 2016-02-18 DIAGNOSIS — J9620 Acute and chronic respiratory failure, unspecified whether with hypoxia or hypercapnia: Secondary | ICD-10-CM | POA: Diagnosis not present

## 2016-02-18 NOTE — Patient Outreach (Signed)
[facility]) Care Management  02/18/2016  [name] June 10, 1951 [id number]     RNCM and [facility] RN Tish Men completed home visit with patient for COPD Education. [name], Occupational Therapist from [place] present during this home.  This RNCM and Rod Holler exchanged contact information, RN advised this RNCM  She was discharging patient as she has met her occupational goal.  Patient and this RNCM viewed EMMI Bundle Video on COPD, including, Action Plan and medications used to manage COPD. Patient listened intensely, asked questions and provided relevant  information during the videos.  Patient and this RNCM viewed her case management care plan, updated. Patient states her stamina is increasing, she continues to work with [facility] RN and PT.  Patient and this RNCM agreed to home visit on June 1, for continued COPD education.

## 2016-02-22 DIAGNOSIS — E785 Hyperlipidemia, unspecified: Secondary | ICD-10-CM | POA: Diagnosis not present

## 2016-02-22 DIAGNOSIS — J9620 Acute and chronic respiratory failure, unspecified whether with hypoxia or hypercapnia: Secondary | ICD-10-CM | POA: Diagnosis not present

## 2016-02-22 DIAGNOSIS — E039 Hypothyroidism, unspecified: Secondary | ICD-10-CM | POA: Diagnosis not present

## 2016-02-22 DIAGNOSIS — Z87891 Personal history of nicotine dependence: Secondary | ICD-10-CM | POA: Diagnosis not present

## 2016-02-22 DIAGNOSIS — J441 Chronic obstructive pulmonary disease with (acute) exacerbation: Secondary | ICD-10-CM | POA: Diagnosis not present

## 2016-02-22 DIAGNOSIS — I10 Essential (primary) hypertension: Secondary | ICD-10-CM | POA: Diagnosis not present

## 2016-02-23 ENCOUNTER — Other Ambulatory Visit: Payer: Self-pay

## 2016-02-23 NOTE — Patient Outreach (Signed)
Unsuccessful attempt made to contact with patient via telephone for community care coordination. Telephone call made to [phone]. Will make another attempt to contact patient next week.

## 2016-02-24 DIAGNOSIS — J9620 Acute and chronic respiratory failure, unspecified whether with hypoxia or hypercapnia: Secondary | ICD-10-CM | POA: Diagnosis not present

## 2016-02-24 DIAGNOSIS — J441 Chronic obstructive pulmonary disease with (acute) exacerbation: Secondary | ICD-10-CM | POA: Diagnosis not present

## 2016-02-24 DIAGNOSIS — Z87891 Personal history of nicotine dependence: Secondary | ICD-10-CM | POA: Diagnosis not present

## 2016-02-24 DIAGNOSIS — I10 Essential (primary) hypertension: Secondary | ICD-10-CM | POA: Diagnosis not present

## 2016-02-24 DIAGNOSIS — E039 Hypothyroidism, unspecified: Secondary | ICD-10-CM | POA: Diagnosis not present

## 2016-02-24 DIAGNOSIS — E785 Hyperlipidemia, unspecified: Secondary | ICD-10-CM | POA: Diagnosis not present

## 2016-02-25 ENCOUNTER — Ambulatory Visit: Payer: Medicare Other | Admitting: Pulmonary Disease

## 2016-02-26 DIAGNOSIS — J441 Chronic obstructive pulmonary disease with (acute) exacerbation: Secondary | ICD-10-CM | POA: Diagnosis not present

## 2016-02-26 DIAGNOSIS — J9620 Acute and chronic respiratory failure, unspecified whether with hypoxia or hypercapnia: Secondary | ICD-10-CM | POA: Diagnosis not present

## 2016-02-26 DIAGNOSIS — E785 Hyperlipidemia, unspecified: Secondary | ICD-10-CM | POA: Diagnosis not present

## 2016-02-26 DIAGNOSIS — E039 Hypothyroidism, unspecified: Secondary | ICD-10-CM | POA: Diagnosis not present

## 2016-02-26 DIAGNOSIS — Z87891 Personal history of nicotine dependence: Secondary | ICD-10-CM | POA: Diagnosis not present

## 2016-02-26 DIAGNOSIS — I10 Essential (primary) hypertension: Secondary | ICD-10-CM | POA: Diagnosis not present

## 2016-03-02 ENCOUNTER — Other Ambulatory Visit: Payer: Self-pay

## 2016-03-02 NOTE — Patient Outreach (Signed)
RNCM and patient spoke via telephone for this discharge telephone call. Patient satisfied HIPPA by providing date of birth and address. Patient and RNCM reviewed her case management goals and updated as indicated.  Patient and this RNCM agreed on completion of goals and patient stated she has no other case management needs at present.    RNCM advised patient she can self refer or have family or medical providers to make a referral if other case management goals arise.    Plan: Discharge letter prepared to be sent to patient and her primary care physician

## 2016-03-03 ENCOUNTER — Ambulatory Visit: Payer: Self-pay

## 2016-03-03 DIAGNOSIS — E039 Hypothyroidism, unspecified: Secondary | ICD-10-CM | POA: Diagnosis not present

## 2016-03-03 DIAGNOSIS — I10 Essential (primary) hypertension: Secondary | ICD-10-CM | POA: Diagnosis not present

## 2016-03-03 DIAGNOSIS — J441 Chronic obstructive pulmonary disease with (acute) exacerbation: Secondary | ICD-10-CM | POA: Diagnosis not present

## 2016-03-03 DIAGNOSIS — E785 Hyperlipidemia, unspecified: Secondary | ICD-10-CM | POA: Diagnosis not present

## 2016-03-03 DIAGNOSIS — Z87891 Personal history of nicotine dependence: Secondary | ICD-10-CM | POA: Diagnosis not present

## 2016-03-03 DIAGNOSIS — J9620 Acute and chronic respiratory failure, unspecified whether with hypoxia or hypercapnia: Secondary | ICD-10-CM | POA: Diagnosis not present

## 2016-03-09 DIAGNOSIS — E039 Hypothyroidism, unspecified: Secondary | ICD-10-CM | POA: Diagnosis not present

## 2016-03-09 DIAGNOSIS — Z87891 Personal history of nicotine dependence: Secondary | ICD-10-CM | POA: Diagnosis not present

## 2016-03-09 DIAGNOSIS — J441 Chronic obstructive pulmonary disease with (acute) exacerbation: Secondary | ICD-10-CM | POA: Diagnosis not present

## 2016-03-09 DIAGNOSIS — J9620 Acute and chronic respiratory failure, unspecified whether with hypoxia or hypercapnia: Secondary | ICD-10-CM | POA: Diagnosis not present

## 2016-03-09 DIAGNOSIS — I10 Essential (primary) hypertension: Secondary | ICD-10-CM | POA: Diagnosis not present

## 2016-03-09 DIAGNOSIS — E785 Hyperlipidemia, unspecified: Secondary | ICD-10-CM | POA: Diagnosis not present

## 2016-03-18 DIAGNOSIS — E785 Hyperlipidemia, unspecified: Secondary | ICD-10-CM | POA: Diagnosis not present

## 2016-03-18 DIAGNOSIS — Z87891 Personal history of nicotine dependence: Secondary | ICD-10-CM | POA: Diagnosis not present

## 2016-03-18 DIAGNOSIS — J441 Chronic obstructive pulmonary disease with (acute) exacerbation: Secondary | ICD-10-CM | POA: Diagnosis not present

## 2016-03-18 DIAGNOSIS — J9620 Acute and chronic respiratory failure, unspecified whether with hypoxia or hypercapnia: Secondary | ICD-10-CM | POA: Diagnosis not present

## 2016-03-18 DIAGNOSIS — E039 Hypothyroidism, unspecified: Secondary | ICD-10-CM | POA: Diagnosis not present

## 2016-03-18 DIAGNOSIS — I10 Essential (primary) hypertension: Secondary | ICD-10-CM | POA: Diagnosis not present

## 2016-03-30 ENCOUNTER — Telehealth: Payer: Self-pay | Admitting: Internal Medicine

## 2016-03-30 NOTE — Telephone Encounter (Signed)
Per 02/08/16 OV w/ TP: Patient Instructions       Check cxr today .   Stop Advair and Spiriva   Restart Stiolto daily, rinse after use.   Set up CT chest in  2 months   Follow up Dr. Melvyn Novas  In 2 months (after CT chest )   Please contact office for sooner follow up if symptoms do not improve or worsen or seek emergency care   ---  Called spoke with pt. She reports she has had increase SOB x few days. appt scheduled to see MW 6/29 at 2:15. Nothing further needed

## 2016-03-31 ENCOUNTER — Encounter: Payer: Self-pay | Admitting: Internal Medicine

## 2016-03-31 ENCOUNTER — Ambulatory Visit (INDEPENDENT_AMBULATORY_CARE_PROVIDER_SITE_OTHER): Payer: Medicare Other | Admitting: Internal Medicine

## 2016-03-31 VITALS — BP 106/70 | HR 88 | Ht 60.0 in | Wt 97.0 lb

## 2016-03-31 DIAGNOSIS — J9611 Chronic respiratory failure with hypoxia: Secondary | ICD-10-CM | POA: Diagnosis not present

## 2016-03-31 DIAGNOSIS — J449 Chronic obstructive pulmonary disease, unspecified: Secondary | ICD-10-CM

## 2016-03-31 MED ORDER — TIOTROPIUM BROMIDE-OLODATEROL 2.5-2.5 MCG/ACT IN AERS: 2.0000 | INHALATION_SPRAY | Freq: Every day | RESPIRATORY_TRACT | Status: DC

## 2016-03-31 MED ORDER — PREDNISONE 10 MG PO TABS: ORAL_TABLET | ORAL | Status: DC

## 2016-03-31 NOTE — Assessment & Plan Note (Signed)
rx 2lpm rest/ 3lpm with exertion as of 03/31/2016   Adequate control on present rx, reviewed > no change in rx needed

## 2016-03-31 NOTE — Patient Instructions (Addendum)
Plan A = Automatic = Stiolto 2 each am   Plan B = Backup Only use your albuterol as a rescue medication to be used if you can't catch your breath by resting or doing a relaxed purse lip breathing pattern.  - The less you use it, the better it will work when you need it. - Ok to use the inhaler up to 2 puffs  every 4 hours if you must but call for appointment if use goes up over your usual need - Don't leave home without it !!  (think of it like the spare tire for your car)   Plan C = Crisis - only use your albuterol nebulizer if you first try Plan B and it fails to help > ok to use the nebulizer up to every 4 hours but if start needing it regularly call for immediate appointment   Plan D = Doctor - call me if B and C not adequate  Plan E = ER - go to ER or call 911 if all else fails    Prednisone 10 mg take  4 each am x 2 days,   2 each am x 2 days,  1 each am x 2 days and stop   Please schedule a follow up office visit in 2 weeks, sooner if needed to see Tammy for alternatives but you must bring your drug formulary if you can get it

## 2016-03-31 NOTE — Progress Notes (Signed)
Subjective:    [name], female    DOB: 06-27-1951   MRN: [id number]  HPI 11/24/2011 - Initial oV  PCP - Meriel Pica   46 yobf  Quit smoking 2010  with  GOLD III criteria copd / 0 2 dep   Significant tests/ events  She was seen by Dr Gwenette Greet 12/24/08 after hospitalization for LUL pna. CT angio 3/10 showed centrilobular emphysema.She was placed on oxygen around this time [facility] pt) .  PFTs  12/2008 FEv1 39%, DLCO 35%, mild air trapping    History of Present Illness  08/28/2015 acute extended ov/Bianney Rockwood re: GOLD III copd/ on spiriva and freq saba and 02 2lpm hs/ 3lpm with activity  Chief Complaint  Patient presents with  . Acute Visit    Pt c/o increased SOB for the past few wks- "I have attacks"- relates to the weather change. She takes albuterol inhaler 2 x per day on average and uses neb 1-2 x per day.   baseline doe = MMRC2 = can't walk a nl pace on a flat grade s sob  >>Stiolto rx   02/08/2016 [facility] follow up  Patient returns for a post hospital follow-up . Patient was admitted 01/17/2016 for COPD exacerbation with acute on chronic respiratory failure She did require initial BiPAP support. She was treated with IV antibiotics, steroids and nebulized bronchodilators. CT chest was negative for pulmonary embolism, showed emphysematous changes and a focal groundglass opacity in RML . Will need follow up CT in 3 months.  Since discharge. Patient is feeling better with less cough and dyspnea.  She is on Advair and Spiriva . Was previously on Stiolto . Wants to go back on Stiolto.  She denies any chest pain, orthopnea, PND, or increased leg swelling. On oxygen 2l/m rest and 3l/m w/ act.  Says her PCP has extended her on prednisone , unsure of dose.  She says she will bring bottle to next visit. Says her appetite is much better.  PVX and Prevnar utd.  rec Stop Advair and Spiriva  Restart Stiolto daily, rinse after use.  Set up CT chest in  2 months    Follow up Dr. Melvyn Novas  In 2 months (after CT chest )     03/31/2016 acute extended ov/Jami Ohlin re: aecopd on anoro req by insurance   2lpm rest/3lpm  Chief Complaint  Patient presents with  . Acute Visit    Pt c/o increased DOE for the past 1-2 wks. She has been using neb at least once per day.  She also c/o facial puffiness and HA "I have had a head cold". She has had minimal cough with white sputum.     easily confused with details of care/ overusing neb and not using saba hfa   Not clear what the alternatives to anoro are. Doe = MMRC3 = can't walk 100 yards even at a slow pace at a flat grade s stopping due to sob  On 3lpm and uses w/c usually to go out.   No obvious day to day or daytime variability or assoc purulent sputum or mucus plugs or hemoptysis or cp or chest tightness, subjective wheeze or overt   hb symptoms. No unusual exp hx or h/o childhood pna/ asthma or knowledge of premature birth.  Sleeping ok without nocturnal  or early am exacerbation  of respiratory  c/o's or need for noct saba. Also denies any obvious fluctuation of symptoms with weather or environmental changes or other aggravating  or alleviating factors except as outlined above   Current Medications, Allergies, Complete Past Medical History, Past Surgical History, Family History, and Social History were reviewed in [employer] record.  ROS  The following are not active complaints unless bolded sore throat, dysphagia, dental problems, itching, sneezing,  nasal congestion or excess/ purulent secretions, ear ache,   fever, chills, sweats, unintended wt loss, classically pleuritic or exertional cp,  orthopnea pnd or leg swelling, presyncope, palpitations, abdominal pain, anorexia, nausea, vomiting, diarrhea  or change in bowel or bladder habits, change in stools or urine, dysuria,hematuria,  rash, arthralgias, visual complaints, headache, numbness, weakness or ataxia or problems with walking or  coordination,  change in mood/affect or memory.                 Objective:   Physical Exam    W/c bound elderly wf nad  Wt Readings from Last 3 Encounters:  03/31/16 97 lb (43.999 kg)  02/08/16 94 lb (42.638 kg)  01/22/16 92 lb 3.2 oz (41.822 kg)    Vital signs reviewed    Vital signs reviewed  Gen. Pleasant, thin woman, in no distress ENT - no lesions, no post nasal drip Neck: No JVD, no thyromegaly, no carotid bruits Lungs: no use of accessory muscles, no dullness to percussion, decreased bs bilaterally  without rales or rhonchi  Cardiovascular: Rhythm regular, heart sounds  normal, no murmurs or gallops, no peripheral edema Musculoskeletal: No deformities, no cyanosis or clubbing      I personally reviewed images and agree with radiology impression as follows:  CXR:  02/08/16  No acute abnormalities. Resolution of faint infiltrate in the right middle lobe. Chronic bronchitic changes. COPD.       Assessment & Plan:

## 2016-03-31 NOTE — Assessment & Plan Note (Signed)
PFTs  12/2008 FEv1 39%, DLCO 35%, mild air trapping  - 03/31/2016  After extensive coaching HFA effectiveness =    75% with hfa/ 90% with respimat  She has GOLD III criteria with Group B symptoms and should do well on laba/lama unless exac in which case go back to Breo/spiriva best dpi combo.  I had an extended discussion with the patient reviewing all relevant studies completed to date and  lasting 15 to 20 minutes of a 25 minute visit    Formulary restrictions will be an ongoing challenge for the forseable future and I would be happy to pick an alternative if the pt will first  provide me a list of them but pt  will need to return here for training for any new device that is required eg dpi vs hfa vs respimat.    In meantime we can always provide samples so the patient never runs out of any needed respiratory medications.   Each maintenance medication was reviewed in detail including most importantly the difference between maintenance and prns and under what circumstances the prns are to be triggered using an action plan format that is not reflected in the computer generated alphabetically organized AVS.    Please see instructions for details which were reviewed in writing and the patient given a copy highlighting the part that I personally wrote and discussed at today's ov.

## 2016-04-12 ENCOUNTER — Ambulatory Visit (INDEPENDENT_AMBULATORY_CARE_PROVIDER_SITE_OTHER)
Admission: RE | Admit: 2016-04-12 | Discharge: 2016-04-12 | Disposition: A | Discharge status: Home / Self Care | Payer: Medicare Other | Source: Ambulatory Visit | Attending: Adult Health | Admitting: Adult Health

## 2016-04-12 ENCOUNTER — Ambulatory Visit: Payer: Medicare Other | Admitting: Internal Medicine

## 2016-04-12 DIAGNOSIS — R911 Solitary pulmonary nodule: Secondary | ICD-10-CM

## 2016-04-12 DIAGNOSIS — J439 Emphysema, unspecified: Secondary | ICD-10-CM | POA: Diagnosis not present

## 2016-04-13 ENCOUNTER — Encounter: Payer: Self-pay | Admitting: Internal Medicine

## 2016-04-13 ENCOUNTER — Ambulatory Visit (INDEPENDENT_AMBULATORY_CARE_PROVIDER_SITE_OTHER): Payer: Medicare Other | Admitting: Internal Medicine

## 2016-04-13 VITALS — BP 100/64 | HR 91 | Ht 61.0 in | Wt 98.2 lb

## 2016-04-13 DIAGNOSIS — R911 Solitary pulmonary nodule: Secondary | ICD-10-CM | POA: Diagnosis not present

## 2016-04-13 DIAGNOSIS — J449 Chronic obstructive pulmonary disease, unspecified: Secondary | ICD-10-CM

## 2016-04-13 DIAGNOSIS — J9611 Chronic respiratory failure with hypoxia: Secondary | ICD-10-CM | POA: Diagnosis not present

## 2016-04-13 NOTE — Progress Notes (Signed)
Subjective:    [name], female    DOB: 01/26/1951   MRN: [id number]  HPI 11/24/2011 - Initial oV  PCP - Meriel Pica   24 yobf  Quit smoking 2010  with  GOLD III criteria copd / 0 2 dep   Significant tests/ events  She was seen by Dr Gwenette Greet 12/24/08 after hospitalization for LUL pna. CT angio 3/10 showed centrilobular emphysema.She was placed on oxygen around this time [facility] pt) .  PFTs  12/2008 FEv1 39%, DLCO 35%, mild air trapping    History of Present Illness  08/28/2015 acute extended ov/Jola Critzer re: GOLD III copd/ on spiriva and freq saba and 02 2lpm hs/ 3lpm with activity  Chief Complaint  Patient presents with  . Acute Visit    Pt c/o increased SOB for the past few wks- "I have attacks"- relates to the weather change. She takes albuterol inhaler 2 x per day on average and uses neb 1-2 x per day.   baseline doe = MMRC2 = can't walk a nl pace on a flat grade s sob  >>Stiolto rx     02/08/2016 [facility] follow up  Patient returns for a post hospital follow-up . Patient was admitted 01/17/2016 for COPD exacerbation with acute on chronic respiratory failure She did require initial BiPAP support. She was treated with IV antibiotics, steroids and nebulized bronchodilators. CT chest was negative for pulmonary embolism, showed emphysematous changes and a focal groundglass opacity in RML . Will need follow up CT in 3 months.  Since discharge. Patient is feeling better with less cough and dyspnea.  She is on Advair and Spiriva . Was previously on Stiolto . Wants to go back on Stiolto.  She denies any chest pain, orthopnea, PND, or increased leg swelling. On oxygen 2l/m rest and 3l/m w/ act.  Says her PCP has extended her on prednisone , unsure of dose.  She says she will bring bottle to next visit. Says her appetite is much better.  PVX and Prevnar utd.  rec Stop Advair and Spiriva  Restart Stiolto daily, rinse after use.  Set up CT chest in  2  months  Follow up Dr. Melvyn Novas  In 2 months (after CT chest )     03/31/2016 acute extended ov/Benjaman Artman re: aecopd on anoro req by insurance   2lpm rest/3lpm  Chief Complaint  Patient presents with  . Acute Visit    Pt c/o increased DOE for the past 1-2 wks. She has been using neb at least once per day.  She also c/o facial puffiness and HA "I have had a head cold". She has had minimal cough with white sputum.     easily confused with details of care/ overusing neb and not using saba hfa   Not clear what the alternatives to anoro are. Doe = MMRC3 = can't walk 100 yards even at a slow pace at a flat grade s stopping due to sob  On 3lpm and uses w/c usually to go out.  rec Plan A = Automatic = Stiolto 2 each am  Plan B = Backup Only use your albuterol as a rescue medication  Plan C = Crisis - only use your albuterol nebulizer if you first try Plan B and it fails to help > ok to use the nebulizer up to every 4 hours but if start needing it regularly call for immediate appointment Plan D = Doctor - call me if B and C not  adequate Plan E = ER - go to ER or call 911 if all else fails   Prednisone 10 mg take  4 each am x 2 days,   2 each am x 2 days,  1 each am x 2 days and stop    04/13/2016  f/u ov/Brayla Pat re: copd III/ 02 3lpm / stiolto  Chief Complaint  Patient presents with  . Follow-up    breathing has not been the same since being out of hospital, more dependant on her oxygen now, hard to get out and do anything.    no change doe on 02 , much less need for  rescue now  Better p pred and no worse off it  Doe = MMRC4  = sob if tries to leave home or while getting dressed     No obvious day to day or daytime variability or assoc purulent sputum or mucus plugs or hemoptysis or cp or chest tightness, subjective wheeze or overt   hb symptoms. No unusual exp hx or h/o childhood pna/ asthma or knowledge of premature birth.  Sleeping ok without nocturnal  or early am exacerbation  of respiratory   c/o's or need for noct saba. Also denies any obvious fluctuation of symptoms with weather or environmental changes or other aggravating or alleviating factors except as outlined above   Current Medications, Allergies, Complete Past Medical History, Past Surgical History, Family History, and Social History were reviewed in [employer] record.  ROS  The following are not active complaints unless bolded sore throat, dysphagia, dental problems, itching, sneezing,  nasal congestion or excess/ purulent secretions, ear ache,   fever, chills, sweats, unintended wt loss, classically pleuritic or exertional cp,  orthopnea pnd or leg swelling, presyncope, palpitations, abdominal pain, anorexia, nausea, vomiting, diarrhea  or change in bowel or bladder habits, change in stools or urine, dysuria,hematuria,  rash, arthralgias, visual complaints, headache, numbness, weakness or ataxia or problems with walking or coordination,  change in mood/affect or memory.                 Objective:   Physical Exam    W/c bound elderly wf nad  04/13/2016       98   03/31/16 97 lb (43.999 kg)  02/08/16 94 lb (42.638 kg)  01/22/16 92 lb 3.2 oz (41.822 kg)    Vital signs reviewed    Vital signs reviewed  Gen. Pleasant, thin woman, in no distress ENT - no lesions, no post nasal drip/ edentulous  Neck: No JVD, no thyromegaly, no carotid bruits Lungs: no use of accessory muscles, no dullness to percussion, decreased bs bilaterally  without rales or rhonchi  Cardiovascular: Rhythm regular, heart sounds  normal, no murmurs or gallops, no peripheral edema Musculoskeletal: No deformities, no cyanosis or clubbing       I personally reviewed images and agree with radiology impression as follows:  CT Chest   04/12/16 Interval resolution of ground-glass opacity in right middle lobe since prior exam, consistent with resolving infectious or inflammatory process.  4 mm indeterminate pulmonary  nodule in the posterior left upper lobe is stable since recent CT 3 months ago, but not definitely visualized on older CT from 2010. No follow-up needed if patient is low-risk.        Assessment & Plan:

## 2016-04-13 NOTE — Patient Instructions (Signed)
Call for alternatives to Stiolto if your insurance won't  Pay enough for it to make it affordable  Please schedule a follow up office visit in 6 weeks, call sooner if needed to see Tammy NP

## 2016-04-14 NOTE — Assessment & Plan Note (Signed)
PFTs  12/2008 FEv1 39%, DLCO 35%, mild air trapping  - 04/13/2016  After extensive coaching HFA effectiveness =    75% with hfa (late trigger )   She has severe dz / w/c bound and doubt she could do rehab at this point but may reconsider in future.  LABA/LAMA best rx here unless starts having flares in which case will need to add ICS  I had an extended discussion with the patient reviewing all relevant studies completed to date and  lasting 15 to 20 minutes of a 25 minute visit    Each maintenance medication was reviewed in detail including most importantly the difference between maintenance and prns and under what circumstances the prns are to be triggered using an action plan format that is not reflected in the computer generated alphabetically organized AVS.    Please see instructions for details which were reviewed in writing and the patient given a copy highlighting the part that I personally wrote and discussed at today's ov.

## 2016-04-14 NOTE — Assessment & Plan Note (Signed)
CT chest 01/2016 >Patchyground-glass changes demonstrated focally in the right middle lungat 1.4 cm diameter>repeat CT chest in 04/2016  CT chest 7/11//2017 >4 mm nodule in LUL >repeat CT chest in 04/2017 only if clinically relevant at that time as she appears very close to endstage dz    Discussed in detail all the  indications, usual  risks and alternatives  relative to the benefits with patient who agrees to proceed with conservative f/u as outlined

## 2016-04-14 NOTE — Assessment & Plan Note (Signed)
rx 2lpm rest/ 3lpm with exertion as of 03/31/2016   Note she is borderline hypercarbic by last bmet so no need to push 02 any higher but at this point certainly needs to wear it 24/7

## 2016-04-15 ENCOUNTER — Telehealth: Payer: Self-pay | Admitting: Adult Health

## 2016-04-15 NOTE — Telephone Encounter (Signed)
Notes Recorded by Melvenia Needles, NP on 04/12/2016 at 4:03 PM RML GGO has resolved. -that is great new.  Very small 4 mm nodule in LUL - would repeat CT chest in 1 year .  Does show some CAD - plaque build up does not tell you how much just that there is some Would send to PCP to determine if referral to [facility] is indicated.   Called and spoke with pt and she is aware of results of CT per TP.  Pt is aware that copy has been forwarded to her PCP.  Nothing further is needed.

## 2016-04-20 ENCOUNTER — Encounter: Payer: Medicare Other | Admitting: Adult Health

## 2016-04-22 NOTE — Progress Notes (Signed)
Quick Note:  lmtcb x4 ______

## 2016-04-25 ENCOUNTER — Telehealth: Payer: Self-pay | Admitting: Internal Medicine

## 2016-04-25 NOTE — Telephone Encounter (Signed)
Spoke with pt and advised of CT results per Tammy Parrett.

## 2016-04-25 NOTE — Telephone Encounter (Signed)
[phone] Daughter Nira Conn

## 2016-04-25 NOTE — Telephone Encounter (Signed)
Received a call back from pt's daughter. I informed her that [place] PA was completed and approved. She states that she would like for the patient to be placed back on Spiriva. I informed her that MW"s recs were to find an alternative from her insurance if the [place] did not work. I offered an ov with SG on 05/12/16 since MW did not have any availability. She refused stating hat she was going back home and would not be able to make the appointment. She decided to keep the appointment on 05/25/16 with MW. She voiced understanding and had no further questions. Nothing further needed.

## 2016-04-25 NOTE — Telephone Encounter (Signed)
Spoke with pt's daughter.  She wanted clarification regarding pt's inhalers being covered.  Pt was told that insurance would not cover [place].  We called in Anoro as a replacement.  Pt tried this but it did not help but would like to see if insurance will pay for Stiolto now. Spoke with Edison Nasuti at [place] given for [employer] (Case# [id number]) Spoke with pt and informed that PA for Stiolto has been approved.  Nothing further needed at this time.

## 2016-04-29 ENCOUNTER — Telehealth: Payer: Self-pay | Admitting: Internal Medicine

## 2016-04-29 NOTE — Telephone Encounter (Signed)
Daughter from [place] demanded to see me for unscheduled appt re her mother's care.   Reviewed instructions re Plan A > E to help optimize her care that pt received on previous visit. Daughter did not recognize instructions and not clear the rest of the fm is communicating all the info from ov's to her so I offered to see pt with daughter as an add on today but did not get the feeling she thought that would be helpful and wanted additional info on her mother's care that would require me to see the pt first and I declined to do so in the absence of an ov or feeling from the pt that plan A thru C were being followed as instructed (plan C says pt should call me if starts relying again on a nebulizer)   Offered to expedite transfer of pt to another pulmonary specialist if daughter requests

## 2016-04-29 NOTE — Telephone Encounter (Signed)
MW spoke directly with pt's daughter and sister. Both listed as contacts. Nothing further needed.

## 2016-05-03 ENCOUNTER — Telehealth: Payer: Self-pay | Admitting: Internal Medicine

## 2016-05-03 MED ORDER — TIOTROPIUM BROMIDE-OLODATEROL 2.5-2.5 MCG/ACT IN AERS: 2.0000 | INHALATION_SPRAY | Freq: Every day | RESPIRATORY_TRACT | 6 refills | Status: DC

## 2016-05-03 NOTE — Telephone Encounter (Signed)
Called spoke with pt. Aware refill for stiolto has been sent in. nothing further needed

## 2016-05-06 DIAGNOSIS — E785 Hyperlipidemia, unspecified: Secondary | ICD-10-CM | POA: Diagnosis not present

## 2016-05-06 DIAGNOSIS — E039 Hypothyroidism, unspecified: Secondary | ICD-10-CM | POA: Diagnosis not present

## 2016-05-25 ENCOUNTER — Ambulatory Visit: Payer: Medicare Other | Admitting: Internal Medicine

## 2016-09-14 DIAGNOSIS — Z23 Encounter for immunization: Secondary | ICD-10-CM | POA: Diagnosis not present

## 2016-09-14 DIAGNOSIS — M25532 Pain in left wrist: Secondary | ICD-10-CM | POA: Diagnosis not present

## 2016-10-04 DIAGNOSIS — R739 Hyperglycemia, unspecified: Secondary | ICD-10-CM | POA: Diagnosis not present

## 2016-10-04 DIAGNOSIS — E039 Hypothyroidism, unspecified: Secondary | ICD-10-CM | POA: Diagnosis not present

## 2016-10-04 DIAGNOSIS — M810 Age-related osteoporosis without current pathological fracture: Secondary | ICD-10-CM | POA: Diagnosis not present

## 2016-10-04 DIAGNOSIS — J449 Chronic obstructive pulmonary disease, unspecified: Secondary | ICD-10-CM | POA: Diagnosis not present

## 2016-10-05 DIAGNOSIS — M1812 Unilateral primary osteoarthritis of first carpometacarpal joint, left hand: Secondary | ICD-10-CM | POA: Diagnosis not present

## 2016-10-05 DIAGNOSIS — M654 Radial styloid tenosynovitis [de Quervain]: Secondary | ICD-10-CM | POA: Diagnosis not present

## 2016-10-26 ENCOUNTER — Ambulatory Visit (INDEPENDENT_AMBULATORY_CARE_PROVIDER_SITE_OTHER): Payer: Medicare Other | Admitting: Internal Medicine

## 2016-10-26 ENCOUNTER — Encounter: Payer: Self-pay | Admitting: Internal Medicine

## 2016-10-26 VITALS — BP 130/70 | HR 77 | Ht 61.0 in | Wt 105.6 lb

## 2016-10-26 DIAGNOSIS — J9611 Chronic respiratory failure with hypoxia: Secondary | ICD-10-CM

## 2016-10-26 DIAGNOSIS — J449 Chronic obstructive pulmonary disease, unspecified: Secondary | ICD-10-CM | POA: Diagnosis not present

## 2016-10-26 DIAGNOSIS — R911 Solitary pulmonary nodule: Secondary | ICD-10-CM

## 2016-10-26 NOTE — Progress Notes (Signed)
Subjective:    [name], female    DOB: 1951-09-09   MRN: [id number]  HPI 11/24/2011 - Initial oV  PCP - Meriel Pica   65 yobf  Quit smoking 2010  with  GOLD III criteria copd / 0 2 dep   Significant tests/ events  She was seen by Dr Gwenette Greet 12/24/08 after hospitalization for LUL pna. CT angio 3/10 showed centrilobular emphysema.She was placed on oxygen around this time [facility] pt) .  PFTs  12/2008 FEv1 39%, DLCO 35%, mild air trapping    History of Present Illness  08/28/2015 acute extended ov/[name] re: GOLD III copd/ on spiriva and freq saba and 02 2lpm hs/ 3lpm with activity  Chief Complaint  Patient presents with  . Acute Visit    Pt c/o increased SOB for the past few wks- "I have attacks"- relates to the weather change. She takes albuterol inhaler 2 x per day on average and uses neb 1-2 x per day.   baseline doe = MMRC2 = can't walk a nl pace on a flat grade s sob  >>Stiolto rx     02/08/2016 NP [facility] follow up  Patient returns for a post hospital follow-up . Patient was admitted 01/17/2016 for COPD exacerbation with acute on chronic respiratory failure She did require initial BiPAP support. She was treated with IV antibiotics, steroids and nebulized bronchodilators. CT chest was negative for pulmonary embolism, showed emphysematous changes and a focal groundglass opacity in RML . Will need follow up CT in 3 months.  Since discharge. Patient is feeling better with less cough and dyspnea.  She is on Advair and Spiriva . Was previously on Stiolto . Wants to go back on Stiolto.  She denies any chest pain, orthopnea, PND, or increased leg swelling. On oxygen 2l/m rest and 3l/m w/ act.  Says her PCP has extended her on prednisone , unsure of dose.  She says she will bring bottle to next visit. Says her appetite is much better.  PVX and Prevnar utd.  rec Stop Advair and Spiriva  Restart Stiolto daily, rinse after use.  Set up CT chest in  2  months  Follow up Dr. Melvyn Novas  In 2 months (after CT chest )     03/31/2016 acute extended ov/[name] re: aecopd on anoro req by insurance   2lpm rest/3lpm  Chief Complaint  Patient presents with  . Acute Visit    Pt c/o increased DOE for the past 1-2 wks. She has been using neb at least once per day.  She also c/o facial puffiness and HA "I have had a head cold". She has had minimal cough with white sputum.     easily confused with details of care/ overusing neb and not using saba hfa   Not clear what the alternatives to anoro are. Doe = MMRC3 = can't walk 100 yards even at a slow pace at a flat grade s stopping due to sob  On 3lpm and uses w/c usually to go out.  rec Plan A = Automatic = Stiolto 2 each am  Plan B = Backup Only use your albuterol as a rescue medication  Plan C = Crisis - only use your albuterol nebulizer if you first try Plan B and it fails to help > ok to use the nebulizer up to every 4 hours but if start needing it regularly call for immediate appointment Plan D = Doctor - call me if B and C not  adequate Plan E = ER - go to ER or call 911 if all else fails   Prednisone 10 mg take  4 each am x 2 days,   2 each am x 2 days,  1 each am x 2 days and stop    04/13/2016  f/u ov/[name] re: copd III/ 02 3lpm / stiolto  Chief Complaint  Patient presents with  . Follow-up    breathing has not been the same since being out of hospital, more dependant on her oxygen now, hard to get out and do anything.    no change doe on 02 , much less need for  rescue now  Better p pred and no worse off it  Doe = MMRC4  = sob if tries to leave home or while getting dressed  rec Call for alternatives to Stiolto if your insurance won't  Pay enough for it to make it affordable      10/26/2016  f/u ov/[name] re:  Copd GOLD III/ 3lpm x use 2lpm hs/ stiolto Chief Complaint  Patient presents with  . Follow-up    Breathing has returned back to her normal baseline. She states she has a "slight cold"-  has minimal cough with greyish sputum.    sleeps ok / rare need for saba hfa/ never neb  Doe = MMRC3 = can't walk 100 yards even at a slow pace at a flat grade s stopping due to sob  Even on 02      No obvious day to day or daytime variability or assoc purulent sputum or mucus plugs or hemoptysis or cp or chest tightness, subjective wheeze or overt   hb symptoms. No unusual exp hx or h/o childhood pna/ asthma or knowledge of premature birth.  Sleeping ok without nocturnal  or early am exacerbation  of respiratory  c/o's or need for noct saba. Also denies any obvious fluctuation of symptoms with weather or environmental changes or other aggravating or alleviating factors except as outlined above   Current Medications, Allergies, Complete Past Medical History, Past Surgical History, Family History, and Social History were reviewed in [employer] record.  ROS  The following are not active complaints unless bolded sore throat, dysphagia, dental problems, itching, sneezing,  nasal congestion now minimal  or excess/ purulent secretions, ear ache,   fever, chills, sweats, unintended wt loss, classically pleuritic or exertional cp,  orthopnea pnd or leg swelling, presyncope, palpitations, abdominal pain, anorexia, nausea, vomiting, diarrhea  or change in bowel or bladder habits, change in stools or urine, dysuria,hematuria,  rash, arthralgias, visual complaints, headache, numbness, weakness or ataxia or problems with walking or coordination,  change in mood/affect or memory.                 Objective:   Physical Exam      elderly bf nad - no longer in w/c   10/26/2016      105  04/13/2016       98   03/31/16 97 lb (43.999 kg)  02/08/16 94 lb (42.638 kg)  01/22/16 92 lb 3.2 oz (41.822 kg)    Vital signs reviewed  - Note on arrival 02 sats  98% on 3lpm        Gen. Pleasant, thin woman, in no distress ENT - no lesions, no post nasal drip/ edentulous  Neck: No JVD, no  thyromegaly, no carotid bruits Lungs: no use of accessory muscles, no dullness to percussion, decreased bs bilaterally  without rales  or rhonchi  Cardiovascular: Rhythm regular, heart sounds  normal, no murmurs or gallops, no peripheral edema Musculoskeletal: No deformities, no cyanosis or clubbing       I personally reviewed images and agree with radiology impression as follows:  CT Chest   04/12/16 Interval resolution of ground-glass opacity in right middle lobe since prior exam, consistent with resolving infectious or inflammatory process.  4 mm indeterminate pulmonary nodule in the posterior left upper lobe is stable since recent CT 3 months ago, but not definitely visualized on older CT from 2010. No follow-up needed if patient is low-risk.        Assessment & Plan:

## 2016-10-26 NOTE — Patient Instructions (Signed)
Plan A = Automatic = Stiolto 2 each am   Plan B = Backup Only use your albuterol (as a rescue medication to be used if you can't catch your breath by resting or doing a relaxed purse lip breathing pattern.  - The less you use it, the better it will work when you need it. - Ok to use the inhaler up to 2 puffs  every 4 hours if you must but call for appointment if use goes up over your usual need - Don't leave home without it !!  (think of it like the spare tire for your car)   Plan C = Crisis - only use your albuterol nebulizer if you first try Plan B and it fails to help > ok to use the nebulizer up to every 4 hours but if start needing it regularly call for immediate appointment   Plan D = Doctor - call me if B and C not adequate  Plan E = ER - go to ER or call 911 if all else fails     Please schedule a follow up visit in 6  months but call sooner if needed

## 2016-10-27 NOTE — Assessment & Plan Note (Signed)
rx 2lpm rest/ 3lpm with exertion as of 10/26/2016

## 2016-10-27 NOTE — Assessment & Plan Note (Signed)
CT chest 01/2016 >Patchyground-glass changes demonstrated focally in the right middle lungat 1.4 cm diameter>repeat CT chest in 04/2016  CT chest 7/11//2017 >4 mm nodule in LUL >repeat CT chest in 04/2017 only if clinically relevant at that time as she appears very close to endstage dz    No change in recs though she is very severe, not endstage but still clearly not op candidate

## 2016-10-27 NOTE — Assessment & Plan Note (Addendum)
PFTs  12/2008 FEv1 39%, DLCO 35%, mild air trapping  - 10/26/2016  After extensive coaching HFA effectiveness =    75% with hfa but highly variable  Pt is Group B in terms of symptom/risk and laba/lama therefore appropriate rx at this point> continue stiolto and prn saba     Each maintenance medication was reviewed in detail including most importantly the difference between maintenance and prns and under what circumstances the prns are to be triggered using an action plan format that is not reflected in the computer generated alphabetically organized AVS.    Please see AVS for specific instructions unique to this visit that I personally wrote and verbalized to the the pt in detail and then reviewed with pt  by my nurse highlighting any  changes in therapy recommended at today's visit to their plan of care.

## 2016-11-16 DIAGNOSIS — M654 Radial styloid tenosynovitis [de Quervain]: Secondary | ICD-10-CM | POA: Diagnosis not present

## 2016-11-29 IMAGING — DX DG CHEST 2V
2 series · 2 of 2 positions shown · non-contrast
Comparison: Chest x-ray dated 01/17/2016 and chest CT dated
01/17/2016

CLINICAL DATA: COPD.  Shortness of breath.

EXAM:
CHEST  2 VIEW

[chest pa]
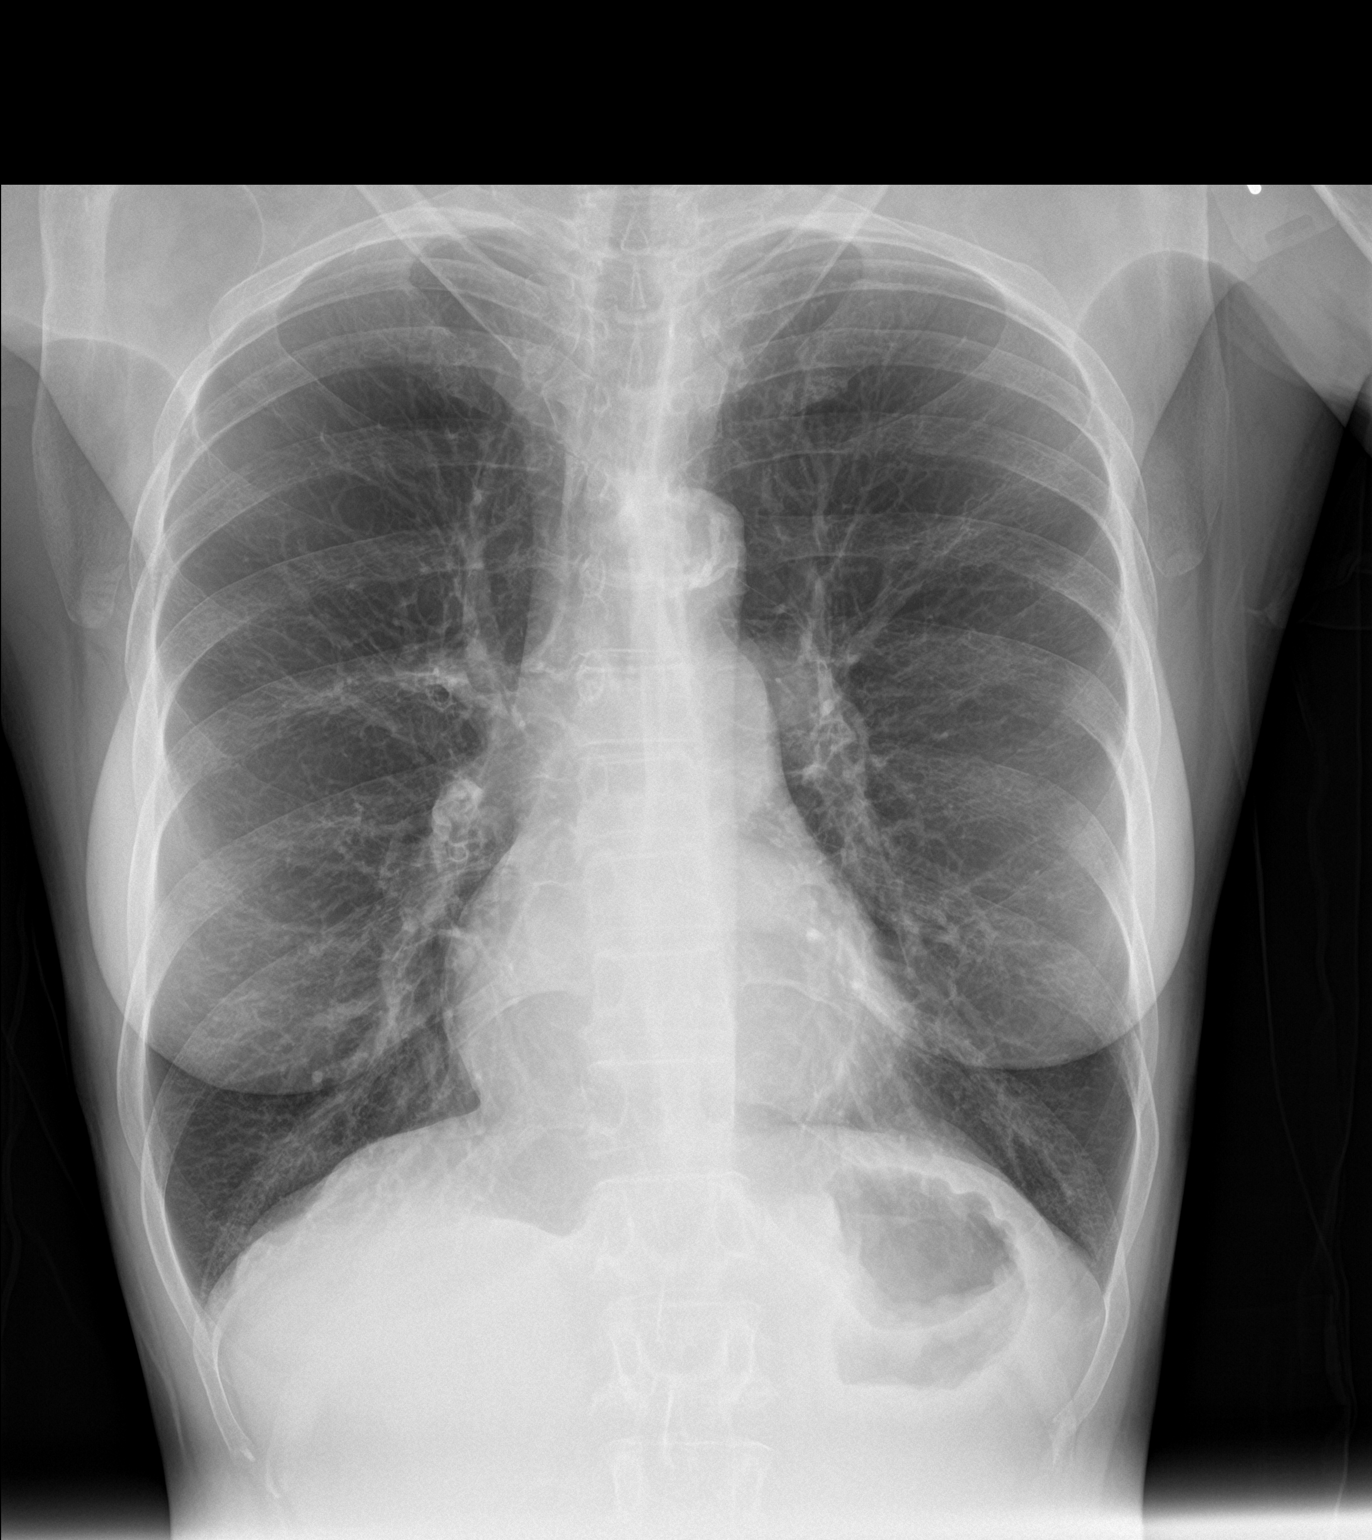

[chest lat]
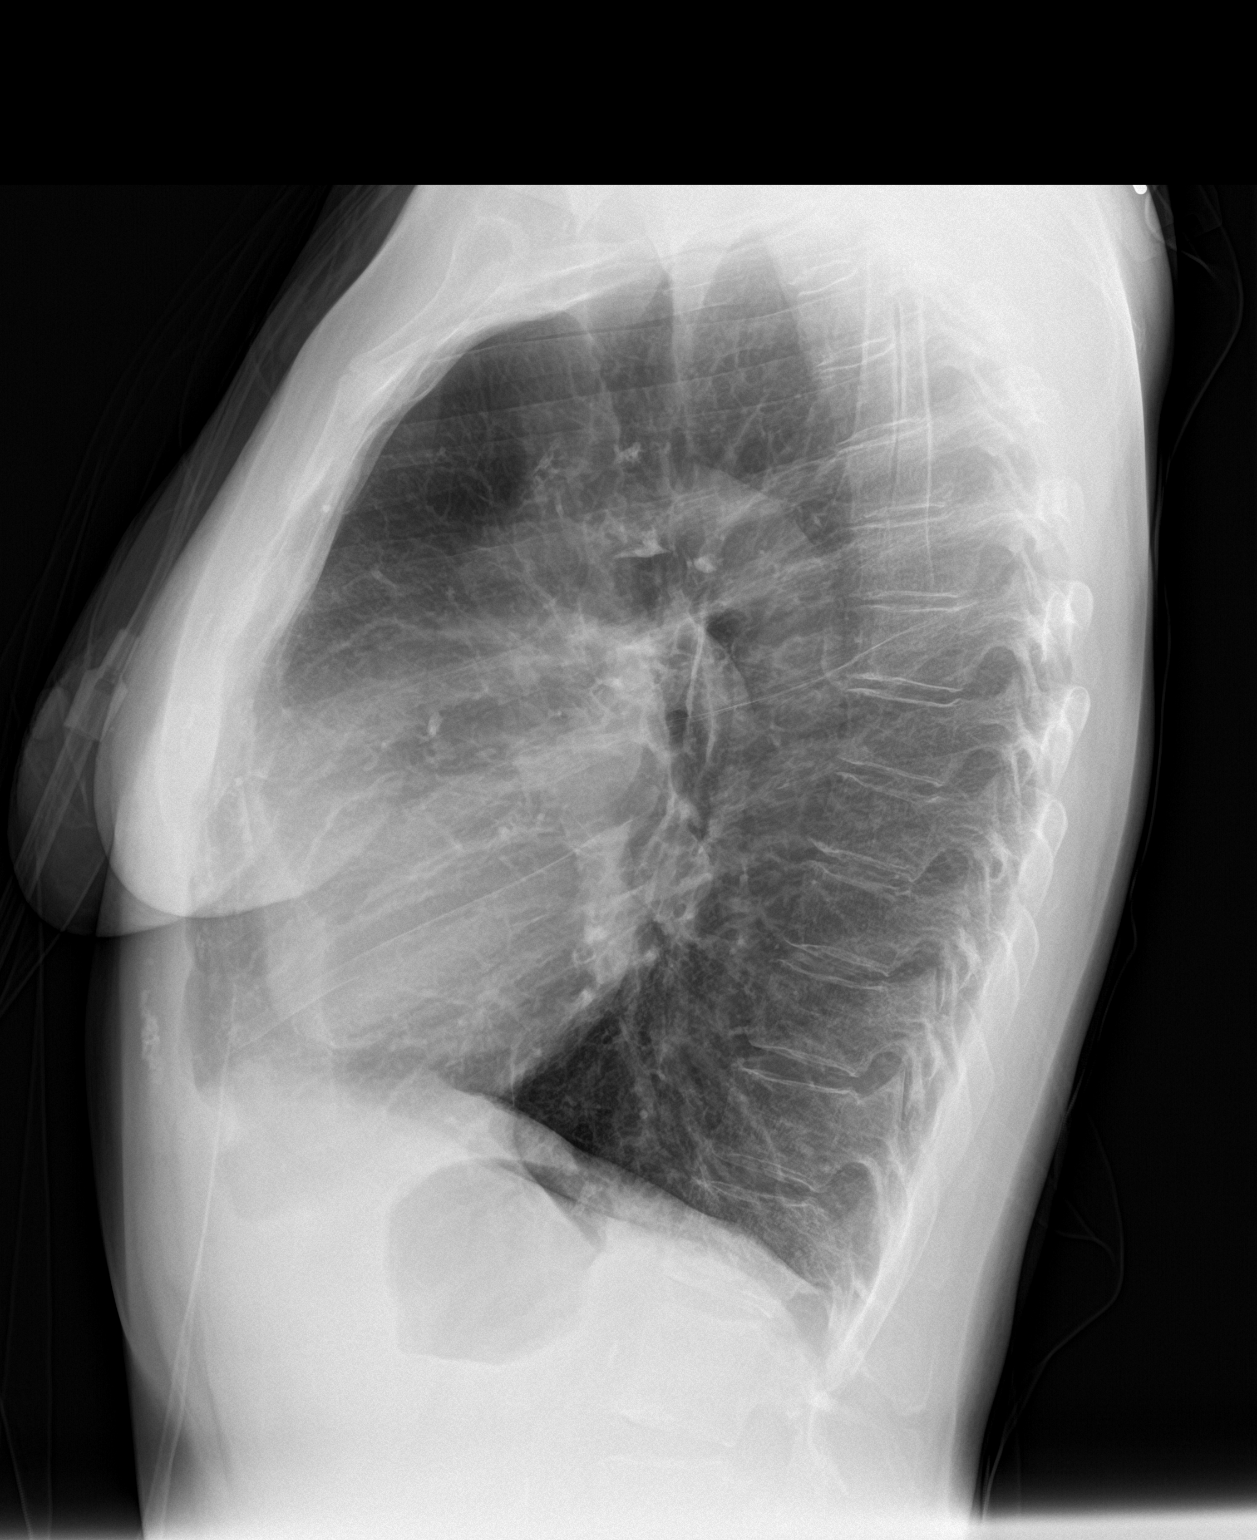

[2 of 2 positions shown; findings below may reference images not displayed]

FINDINGS: The heart size and pulmonary vascularity are normal. Dense
calcification in the arch of the aorta. Lungs are hyperinflated with
flattening of the diaphragm. Small patchy area of faint infiltrate
in the right middle lobe has resolved. Chronic peribronchial
thickening. No acute bone abnormality.
IMPRESSION: No acute abnormalities. Resolution of faint infiltrate in the right
middle lobe. Chronic bronchitic changes. COPD.

Aortic atherosclerosis.

## 2016-12-20 DIAGNOSIS — M810 Age-related osteoporosis without current pathological fracture: Secondary | ICD-10-CM | POA: Diagnosis not present

## 2016-12-22 ENCOUNTER — Other Ambulatory Visit: Payer: Self-pay | Admitting: Internal Medicine

## 2017-03-16 ENCOUNTER — Other Ambulatory Visit: Payer: Self-pay | Admitting: Internal Medicine

## 2017-03-16 DIAGNOSIS — R7303 Prediabetes: Secondary | ICD-10-CM | POA: Diagnosis not present

## 2017-03-16 DIAGNOSIS — Z1231 Encounter for screening mammogram for malignant neoplasm of breast: Secondary | ICD-10-CM | POA: Diagnosis not present

## 2017-03-16 DIAGNOSIS — E039 Hypothyroidism, unspecified: Secondary | ICD-10-CM | POA: Diagnosis not present

## 2017-03-16 DIAGNOSIS — I10 Essential (primary) hypertension: Secondary | ICD-10-CM | POA: Diagnosis not present

## 2017-03-16 DIAGNOSIS — M810 Age-related osteoporosis without current pathological fracture: Secondary | ICD-10-CM | POA: Diagnosis not present

## 2017-03-16 DIAGNOSIS — N6320 Unspecified lump in the left breast, unspecified quadrant: Secondary | ICD-10-CM

## 2017-03-21 ENCOUNTER — Ambulatory Visit
Admission: RE | Admit: 2017-03-21 | Discharge: 2017-03-21 | Disposition: A | Discharge status: Home / Self Care | Payer: Medicare Other | Source: Ambulatory Visit | Attending: Internal Medicine | Admitting: Internal Medicine

## 2017-03-21 DIAGNOSIS — N6320 Unspecified lump in the left breast, unspecified quadrant: Secondary | ICD-10-CM

## 2017-03-21 DIAGNOSIS — N6002 Solitary cyst of left breast: Secondary | ICD-10-CM | POA: Diagnosis not present

## 2017-03-21 DIAGNOSIS — R928 Other abnormal and inconclusive findings on diagnostic imaging of breast: Secondary | ICD-10-CM | POA: Diagnosis not present

## 2017-04-17 DIAGNOSIS — E039 Hypothyroidism, unspecified: Secondary | ICD-10-CM | POA: Diagnosis not present

## 2017-04-25 ENCOUNTER — Encounter: Payer: Self-pay | Admitting: Internal Medicine

## 2017-04-25 ENCOUNTER — Ambulatory Visit (INDEPENDENT_AMBULATORY_CARE_PROVIDER_SITE_OTHER): Payer: Medicare Other | Admitting: Internal Medicine

## 2017-04-25 VITALS — BP 130/72 | HR 90 | Ht 61.0 in | Wt 107.0 lb

## 2017-04-25 DIAGNOSIS — J449 Chronic obstructive pulmonary disease, unspecified: Secondary | ICD-10-CM | POA: Diagnosis not present

## 2017-04-25 DIAGNOSIS — R911 Solitary pulmonary nodule: Secondary | ICD-10-CM

## 2017-04-25 DIAGNOSIS — J9611 Chronic respiratory failure with hypoxia: Secondary | ICD-10-CM | POA: Diagnosis not present

## 2017-04-25 MED ORDER — TIOTROPIUM BROMIDE-OLODATEROL 2.5-2.5 MCG/ACT IN AERS: 2.0000 | INHALATION_SPRAY | Freq: Every day | RESPIRATORY_TRACT | 0 refills | Status: DC

## 2017-04-25 NOTE — Patient Instructions (Signed)
No change in medications  Work on maintaining optimal inhaler technique:  relax and gently blow all the way out then take a nice smooth deep breath back in, triggering the inhaler at same time you start breathing in.  Hold for up to 5 seconds if you can. Blow out thru nose. Rinse and gargle with water when done  Please see patient coordinator before you leave today  to schedule rehab referral  Please schedule a follow up visit in 6 months but call sooner if needed with cxr on return

## 2017-04-25 NOTE — Assessment & Plan Note (Signed)
PFTs  12/2008 FEv1 39%, DLCO 35%, mild air trapping   - 04/25/2017  After extensive coaching HFA effectiveness =    75% (short Ti) - Referred 04/25/2017    Pt is Group B in terms of symptom/risk and laba/lama therefore appropriate rx at this point= stiolto and would benefit from rehab more than any change in pulmonary rx so no changes in rx recommended   I had an extended discussion with the patient reviewing all relevant studies completed to date and  lasting 15 to 20 minutes of a 25 minute visit    Each maintenance medication was reviewed in detail including most importantly the difference between maintenance and prns and under what circumstances the prns are to be triggered using an action plan format that is not reflected in the computer generated alphabetically organized AVS.    Please see AVS for specific instructions unique to this visit that I personally wrote and verbalized to the the pt in detail and then reviewed with pt  by my nurse highlighting any  changes in therapy recommended at today's visit to their plan of care.

## 2017-04-25 NOTE — Assessment & Plan Note (Addendum)
CT chest 01/2016 >Patchyground-glass changes demonstrated focally in the right middle lungat 1.4 cm diameter>repeat CT chest in 04/2016  CT chest 7/11//2017 >4 mm nodule in LUL >repeat CT chest in 04/2017 only if clinically relevant at that time as she appears very close to endstage dz  Discussed in detail all the  indications, usual  risks and alternatives  relative to the benefits with patient who agrees to proceed with conservative f/u with  cxr in 6 m  And no more ct's for "early detection"

## 2017-04-25 NOTE — Assessment & Plan Note (Signed)
rx 2lpm rest/ 3lpm with exertion as of 04/25/2017

## 2017-04-25 NOTE — Progress Notes (Signed)
Subjective:    [name], female    DOB: 1950-12-12   MRN: [id number]      Brief patient profile:  3 yobf  Quit smoking 2010  with  GOLD III criteria copd / 0 2 dep   Significant tests/ events  She was seen by Dr Gwenette Greet 12/24/08 after hospitalization for LUL pna. CT angio 3/10 showed centrilobular emphysema.She was placed on oxygen around this time [facility] pt) .  PFTs  12/2008 FEv1 39%, DLCO 35%, mild air trapping    History of Present Illness  08/28/2015 acute extended ov/[name] re: GOLD III copd/ on spiriva and freq saba and 02 2lpm hs/ 3lpm with activity  Chief Complaint  Patient presents with  . Acute Visit    Pt c/o increased SOB for the past few wks- "I have attacks"- relates to the weather change. She takes albuterol inhaler 2 x per day on average and uses neb 1-2 x per day.   baseline doe = MMRC2 = can't walk a nl pace on a flat grade s sob  >>Stiolto rx     02/08/2016 NP [facility] follow up  Patient returns for a post hospital follow-up . Patient was admitted 01/17/2016 for COPD exacerbation with acute on chronic respiratory failure She did require initial BiPAP support. She was treated with IV antibiotics, steroids and nebulized bronchodilators. CT chest was negative for pulmonary embolism, showed emphysematous changes and a focal groundglass opacity in RML . Will need follow up CT in 3 months.  Since discharge. Patient is feeling better with less cough and dyspnea.  She is on Advair and Spiriva . Was previously on Stiolto . Wants to go back on Stiolto.  She denies any chest pain, orthopnea, PND, or increased leg swelling. On oxygen 2l/m rest and 3l/m w/ act.  Says her PCP has extended her on prednisone , unsure of dose.  She says she will bring bottle to next visit. Says her appetite is much better.  PVX and Prevnar utd.  rec Stop Advair and Spiriva  Restart Stiolto daily, rinse after use.  Set up CT chest in  2 months  Follow up Dr. Melvyn Novas   In 2 months (after CT chest )     03/31/2016 acute extended ov/[name] re: aecopd on anoro req by insurance   2lpm rest/3lpm  Chief Complaint  Patient presents with  . Acute Visit    Pt c/o increased DOE for the past 1-2 wks. She has been using neb at least once per day.  She also c/o facial puffiness and HA "I have had a head cold". She has had minimal cough with white sputum.     easily confused with details of care/ overusing neb and not using saba hfa   Not clear what the alternatives to anoro are. Doe = MMRC3 = can't walk 100 yards even at a slow pace at a flat grade s stopping due to sob  On 3lpm and uses w/c usually to go out.  rec Plan A = Automatic = Stiolto 2 each am  Plan B = Backup Only use your albuterol as a rescue medication  Plan C = Crisis - only use your albuterol nebulizer if you first try Plan B and it fails to help > ok to use the nebulizer up to every 4 hours but if start needing it regularly call for immediate appointment Plan D = Doctor - call me if B and C not adequate Plan E =  ER - go to ER or call 911 if all else fails   Prednisone 10 mg take  4 each am x 2 days,   2 each am x 2 days,  1 each am x 2 days and stop    04/13/2016  f/u ov/[name] re: copd III/ 02 3lpm / stiolto  Chief Complaint  Patient presents with  . Follow-up    breathing has not been the same since being out of hospital, more dependant on her oxygen now, hard to get out and do anything.    no change doe on 02 , much less need for  rescue now  Better p pred and no worse off it  Doe = MMRC4  = sob if tries to leave home or while getting dressed  rec Call for alternatives to Stiolto if your insurance won't  Pay enough for it to make it affordable      10/26/2016  f/u ov/[name] re:  Copd GOLD III/ 3lpm x use 2lpm hs/ stiolto Chief Complaint  Patient presents with  . Follow-up    Breathing has returned back to her normal baseline. She states she has a "slight cold"- has minimal cough with  greyish sputum.    sleeps ok / rare need for saba hfa/ never neb  Doe = MMRC3 = can't walk 100 yards even at a slow pace at a flat grade s stopping due to sob  Even on 02  rec Plan A = Automatic = Stiolto 2 each am  Plan B = Backup Only use your albuterol  Plan C = Crisis - only use your albuterol nebulizer if you first try Plan B     04/25/2017  f/u ov/[name] re: COPD GOLD III/  3lpm with ambulation/ 2lpm otherwise  Chief Complaint  Patient presents with  . Follow-up    Breathing is doing about the same. She denies any new co's. She has not used her albuterol inhaler or neb.   doe still MMRC 3  No need for saba Sleeping fine on 2lpm  Has never done pulmonary rehab   No obvious day to day or daytime variability or assoc excess/ purulent sputum or mucus plugs or hemoptysis or cp or chest tightness, subjective wheeze or overt sinus or hb symptoms. No unusual exp hx or h/o childhood pna/ asthma or knowledge of premature birth.  Sleeping ok without nocturnal  or early am exacerbation  of respiratory  c/o's or need for noct saba. Also denies any obvious fluctuation of symptoms with weather or environmental changes or other aggravating or alleviating factors except as outlined above   Current Medications, Allergies, Complete Past Medical History, Past Surgical History, Family History, and Social History were reviewed in [employer] record.  ROS  The following are not active complaints unless bolded sore throat, dysphagia, dental problems, itching, sneezing,  nasal congestion or excess/ purulent secretions, ear ache,   fever, chills, sweats, unintended wt loss, classically pleuritic or exertional cp,  orthopnea pnd or leg swelling, presyncope, palpitations, abdominal pain, anorexia, nausea, vomiting, diarrhea  or change in bowel or bladder habits, change in stools or urine, dysuria,hematuria,  rash, arthralgias, visual complaints, headache, numbness, weakness or ataxia or  problems with walking or coordination,  change in mood/affect or memory.                 Objective:   Physical Exam      elderly bf nad  04/25/2017      107 10/26/2016  105  04/13/2016       98   03/31/16 97 lb (43.999 kg)  02/08/16 94 lb (42.638 kg)  01/22/16 92 lb 3.2 oz (41.822 kg)    Vital signs reviewed  - Note on arrival 02 sats   93% on 3lpm      Gen. Pleasant, thin woman, in no distress ENT - no lesions, no post nasal drip/ edentulous  Neck: No JVD, no thyromegaly, no carotid bruits Lungs: no use of accessory muscles, no dullness to percussion, decreased bs bilaterally  without rales or rhonchi  ABD:  Soft/ pos hoover's early on insp Cardiovascular: Rhythm regular, heart sounds  normal, no murmurs or gallops, no peripheral edema Musculoskeletal: No deformities, no cyanosis or clubbing       I personally reviewed images and agree with radiology impression as follows:  CT Chest   04/12/16 Interval resolution of ground-glass opacity in right middle lobe since prior exam, consistent with resolving infectious or inflammatory process.  4 mm indeterminate pulmonary nodule in the posterior left upper lobe is stable since recent CT 3 months ago, but not definitely visualized on older CT from 2010.        Assessment & Plan:

## 2017-06-01 DIAGNOSIS — Z1389 Encounter for screening for other disorder: Secondary | ICD-10-CM | POA: Diagnosis not present

## 2017-06-01 DIAGNOSIS — J449 Chronic obstructive pulmonary disease, unspecified: Secondary | ICD-10-CM | POA: Diagnosis not present

## 2017-06-01 DIAGNOSIS — R7303 Prediabetes: Secondary | ICD-10-CM | POA: Diagnosis not present

## 2017-06-01 DIAGNOSIS — I10 Essential (primary) hypertension: Secondary | ICD-10-CM | POA: Diagnosis not present

## 2017-06-01 DIAGNOSIS — G4700 Insomnia, unspecified: Secondary | ICD-10-CM | POA: Diagnosis not present

## 2017-06-01 DIAGNOSIS — E039 Hypothyroidism, unspecified: Secondary | ICD-10-CM | POA: Diagnosis not present

## 2017-06-01 DIAGNOSIS — E7800 Pure hypercholesterolemia, unspecified: Secondary | ICD-10-CM | POA: Diagnosis not present

## 2017-06-01 DIAGNOSIS — Z0000 Encounter for general adult medical examination without abnormal findings: Secondary | ICD-10-CM | POA: Diagnosis not present

## 2017-06-01 DIAGNOSIS — M810 Age-related osteoporosis without current pathological fracture: Secondary | ICD-10-CM | POA: Diagnosis not present

## 2017-06-09 ENCOUNTER — Telehealth: Payer: Self-pay | Admitting: Internal Medicine

## 2017-06-09 DIAGNOSIS — J449 Chronic obstructive pulmonary disease, unspecified: Secondary | ICD-10-CM

## 2017-06-09 MED ORDER — TIOTROPIUM BROMIDE-OLODATEROL 2.5-2.5 MCG/ACT IN AERS: 2.0000 | INHALATION_SPRAY | Freq: Every day | RESPIRATORY_TRACT | 0 refills | Status: DC

## 2017-06-09 NOTE — Telephone Encounter (Signed)
Called pharmacy, states that it is stiolto and not proventil is not covered by insurance.  anoro or bevespi are covered alternatives.  Called and spoke with Baxter Flattery, pt's sister (dpr on file), advised that we would leave sample of stiolto up front for pickup, and that we will discuss covered alternatives with MW when he returns to clinic next week.  Pt's sister expressed understanding.  MW please advise on preferred covered alternative.  Thanks!

## 2017-06-09 NOTE — Telephone Encounter (Signed)
I meant to put as high priority.

## 2017-06-10 NOTE — Telephone Encounter (Signed)
Change to bevespi 2 bid but be sure has f/u ov before her first month's supply runs out

## 2017-06-12 NOTE — Telephone Encounter (Signed)
atc pt X2, phone did not ring, just was static Xseveral seconds. Wcb.

## 2017-06-13 MED ORDER — GLYCOPYRROLATE-FORMOTEROL 9-4.8 MCG/ACT IN AERO: 2.0000 | INHALATION_SPRAY | Freq: Two times a day (BID) | RESPIRATORY_TRACT | 0 refills | Status: DC

## 2017-06-13 NOTE — Telephone Encounter (Signed)
Spoke with [name]. She is aware of the medication change and directions. Advised her that I have samples up front for her. She verbalized understanding.

## 2017-06-19 ENCOUNTER — Telehealth: Payer: Self-pay | Admitting: Internal Medicine

## 2017-06-19 NOTE — Telephone Encounter (Signed)
Per pulm rehab, pt would need pft's in order to use COPD dx.  Thanks!

## 2017-06-19 NOTE — Telephone Encounter (Signed)
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

## 2017-06-19 NOTE — Telephone Encounter (Signed)
MW please advise on alternative dx that can be used for pulm rehab referral.  Thanks!

## 2017-06-19 NOTE — Telephone Encounter (Signed)
Ok to order new pfts then - spirometry before and after is all we need

## 2017-06-20 ENCOUNTER — Telehealth: Payer: Self-pay | Admitting: Internal Medicine

## 2017-06-20 NOTE — Telephone Encounter (Signed)
lmtcb x1 for pt. 

## 2017-06-20 NOTE — Telephone Encounter (Signed)
Pt returned Margie's call, and a duplicate phone note was opened.   lmtcb X1 for pt.  We just need to schedule pt for a Before and After PFT in order for pt to participate in pulm rehab.

## 2017-06-20 NOTE — Telephone Encounter (Signed)
ERRONEOUS PHONE NOTE. Please see 9/17 phone note.

## 2017-06-22 NOTE — Telephone Encounter (Signed)
lmtcb x2 for pt. 

## 2017-06-22 NOTE — Telephone Encounter (Signed)
Pt called back and I scheduled for PFT at 2.  -tr

## 2017-06-22 NOTE — Telephone Encounter (Signed)
Spoke with the pt  I advised that she is just needing breathing test done for rehab- before and after  Her cousin brings her and she will need to coordinate a time with her so she will have to call back [place]

## 2017-06-22 NOTE — Telephone Encounter (Signed)
Patient is returning phone call.  °

## 2017-06-23 ENCOUNTER — Ambulatory Visit: Payer: Medicare Other | Admitting: Internal Medicine

## 2017-06-23 ENCOUNTER — Other Ambulatory Visit: Payer: Self-pay | Admitting: Pulmonary Disease

## 2017-06-23 ENCOUNTER — Ambulatory Visit (INDEPENDENT_AMBULATORY_CARE_PROVIDER_SITE_OTHER): Payer: Medicare Other

## 2017-06-23 DIAGNOSIS — Z23 Encounter for immunization: Secondary | ICD-10-CM | POA: Diagnosis not present

## 2017-06-23 DIAGNOSIS — J449 Chronic obstructive pulmonary disease, unspecified: Secondary | ICD-10-CM

## 2017-06-23 LAB — PULMONARY FUNCTION TEST
FEF 25-75 PRE: 0.19 L/s
FEF 25-75 Post: 0.2 L/sec
FEF2575-%Change-Post: 3 %
FEF2575-%PRED-PRE: 12 %
FEF2575-%Pred-Post: 12 %
FEV1-%Change-Post: 3 %
FEV1-%PRED-POST: 29 %
FEV1-%PRED-PRE: 29 %
FEV1-POST: 0.48 L
FEV1-PRE: 0.47 L
FEV1FVC-%Change-Post: 1 %
FEV1FVC-%Pred-Pre: 48 %
FEV6-%CHANGE-POST: 2 %
FEV6-%PRED-POST: 59 %
FEV6-%Pred-Pre: 58 %
FEV6-POST: 1.19 L
FEV6-PRE: 1.16 L
FEV6FVC-%Change-Post: 1 %
FEV6FVC-%PRED-POST: 99 %
FEV6FVC-%Pred-Pre: 98 %
FVC-%Change-Post: 1 %
FVC-%Pred-Post: 59 %
FVC-%Pred-Pre: 59 %
FVC-Post: 1.25 L
FVC-Pre: 1.23 L
POST FEV1/FVC RATIO: 39 %
POST FEV6/FVC RATIO: 96 %
Pre FEV1/FVC ratio: 38 %
Pre FEV6/FVC Ratio: 94 %

## 2017-06-23 NOTE — Progress Notes (Signed)
PFT done today. 

## 2017-06-26 ENCOUNTER — Encounter (HOSPITAL_COMMUNITY)
Admission: RE | Admit: 2017-06-26 | Discharge: 2017-06-26 | Disposition: A | Discharge status: Home / Self Care | Payer: Medicare Other | Source: Ambulatory Visit | Attending: Internal Medicine | Admitting: Internal Medicine

## 2017-06-26 ENCOUNTER — Encounter (HOSPITAL_COMMUNITY): Payer: Self-pay

## 2017-06-26 VITALS — BP 181/96 | HR 87 | Resp 18 | Ht 61.0 in | Wt 108.0 lb

## 2017-06-26 DIAGNOSIS — J9611 Chronic respiratory failure with hypoxia: Secondary | ICD-10-CM | POA: Insufficient documentation

## 2017-06-26 NOTE — Progress Notes (Signed)
[name] 66 y.o. female Pulmonary Rehab Orientation Note Patient arrived today in Cardiac and Pulmonary Rehab for orientation to Pulmonary Rehab. She was transported from [employer] via wheel chair. She does carry portable oxygen. Per pt, she uses oxygen continuously. Color good, skin warm and dry. Patient is oriented to time and place. Patient's medical history, psychosocial health, and medications reviewed. Psychosocial assessment reveals pt lives with her sister.. Pt is currently retired. Pt hobbies include baking cakes.. Pt reports her stress level is very low. Areas of stress/anxiety include having to "drag" oxygen around with her.Pt does not exhibit signs of depression. PHQ2/9 score 0/3. Pt shows good  coping skills with positive outlook. She is offered emotional support and reassurance. Will continue to monitor and evaluate progress toward psychosocial goal(s) of remaining positive about her future. Physical assessment reveals heart rate is normal, breath sounds clear to auscultation, no wheezes, rales, or rhonchi. Diminished throughout. Grip strength equal, strong. Distal pulses palpable. Patient reports she does take medications as prescribed. Patient states she follows a Regular diet. The patient reports no specific efforts to gain or lose weight.. Patient's weight will be monitored closely. Demonstration and practice of PLB using pulse oximeter. Patient able to return demonstration satisfactorily. Safety and hand hygiene in the exercise area reviewed with patient. Patient voices understanding of the information reviewed. Department expectations discussed with patient and achievable goals were set. The patient shows enthusiasm about attending the program and we look forward to working with this nice lady. The patient is scheduled for a 6 min walk test on 06/27/17 and to begin exercise on 07/04/17 at 1030.   45 minutes was spent on a variety of activities such as assessment of the patient,  obtaining baseline data including height, weight, BMI, and grip strength, verifying medical history, allergies, and current medications, and teaching patient strategies for performing tasks with less respiratory effort with emphasis on pursed lip breathing.

## 2017-06-27 ENCOUNTER — Encounter (HOSPITAL_COMMUNITY)
Admission: RE | Admit: 2017-06-27 | Discharge: 2017-06-27 | Disposition: A | Discharge status: Home / Self Care | Payer: Medicare Other | Source: Ambulatory Visit | Attending: Internal Medicine | Admitting: Internal Medicine

## 2017-06-27 DIAGNOSIS — J9611 Chronic respiratory failure with hypoxia: Secondary | ICD-10-CM | POA: Diagnosis not present

## 2017-06-28 ENCOUNTER — Encounter (HOSPITAL_COMMUNITY): Payer: Self-pay | Admitting: *Deleted

## 2017-06-29 NOTE — Progress Notes (Signed)
Pulmonary Individual Treatment Plan  Patient Details  Name: Cella Cappello MRN: [id number] Date of Birth: [birth year] Referring Provider:     Pulmonary Rehab Walk Test from 06/27/2017 in [facility]  Referring Provider  Dr. Melvyn Novas      Initial Encounter Date:    Pulmonary Rehab Walk Test from 06/27/2017 in [facility]  Date  06/29/17  Referring Provider  Dr. Melvyn Novas      Visit Diagnosis: Chronic respiratory failure with hypoxia ([place])  Patient's Home Medications on Admission:   Current Outpatient Prescriptions:  .  albuterol (PROVENTIL HFA;VENTOLIN HFA) 108 (90 BASE) MCG/ACT inhaler, Inhale 2 puffs into the lungs every 4 (four) hours as needed for wheezing or shortness of breath., Disp: 18 g, Rfl: 0 .  albuterol (PROVENTIL) (2.5 MG/3ML) 0.083% nebulizer solution, Take 3 mLs (2.5 mg total) by nebulization every 2 (two) hours as needed for wheezing or shortness of breath. (Patient not taking: Reported on 06/26/2017), Disp: 75 mL, Rfl: 0 .  alendronate (FOSAMAX) 70 MG tablet, TAKE 1 TABLET ONCE A WEEK, Disp: , Rfl: 11 .  aspirin EC 81 MG tablet, Take 81 mg by mouth daily., Disp: , Rfl:  .  atenolol (TENORMIN) 25 MG tablet, Take 25 mg by mouth daily., Disp: , Rfl:  .  Cyanocobalamin (VITAMIN B-12 PO), Take 1 tablet by mouth daily., Disp: , Rfl:  .  Glycopyrrolate-Formoterol (BEVESPI AEROSPHERE) 9-4.8 MCG/ACT AERO, Inhale 2 puffs into the lungs 2 (two) times daily., Disp: 2 Inhaler, Rfl: 0 .  guaiFENesin (MUCINEX) 600 MG 12 hr tablet, Take 1 tablet (600 mg total) by mouth 2 (two) times daily. (Patient not taking: Reported on 06/26/2017), Disp: 15 tablet, Rfl: 0 .  levothyroxine (SYNTHROID, LEVOTHROID) 75 MCG tablet, Take 75 mcg by mouth daily., Disp: , Rfl: 3 .  lovastatin (MEVACOR) 20 MG tablet, Take 20 mg by mouth at bedtime., Disp: , Rfl:  .  mirtazapine (REMERON) 15 MG tablet, Take 15 mg by mouth at bedtime as needed. sleep, Disp: ,  Rfl:  .  Multiple Vitamin (MULTIVITAMIN WITH MINERALS) TABS tablet, Take 1 tablet by mouth daily., Disp: , Rfl:  .  OXYGEN, Pt uses 2lpm with sleep and 3 lpm with exertion  DME- [facility], Disp: , Rfl:  .  Tiotropium Bromide-Olodaterol (STIOLTO RESPIMAT) 2.5-2.5 MCG/ACT AERS, Inhale 2 puffs into the lungs daily., Disp: 2 Inhaler, Rfl: 0  Past Medical History: Past Medical History:  Diagnosis Date  . Acute on chronic respiratory failure ([place]) 01/17/2016  . Chronic bronchitis   . COPD (chronic obstructive pulmonary disease) ([place])   . Hyperlipidemia   . Hypertension   . Hypothyroidism   . Post-menopausal     Tobacco Use: History  Smoking Status  . Former Smoker  . Packs/day: 0.50  . Years: 20.00  . Types: Cigarettes  . Quit date: 11/03/2008  Smokeless Tobacco  . Never Used    Labs: Recent Review Flowsheet Data    Labs for ITP Cardiac and Pulmonary Rehab Latest Ref Rng & Units 10/22/2008 10/22/2008 01/17/2016   PHART 7.350 - 7.400 - 7.455(H) -   PCO2ART 35.0 - 45.0 mmHg - 38.0 -   HCO3 20.0 - 24.0 mEq/L - 26.3(H) 29.5(H)   TCO2 0 - 100 mmol/L 26 23.5 31   O2SAT % - 81.1 35.0      Capillary Blood Glucose: Lab Results  Component Value Date   GLUCAP 158 (H) 01/22/2016   GLUCAP 149 (H) 01/22/2016  GLUCAP 147 (H) 01/21/2016   GLUCAP 175 (H) 01/21/2016   GLUCAP 207 (H) 01/21/2016     Pulmonary Assessment Scores:     Pulmonary Assessment Scores    Row Name 06/28/17 1403         ADL UCSD   ADL Phase Entry     SOB Score total 36       CAT Score   CAT Score 16  Entry        Pulmonary Function Assessment:     Pulmonary Function Assessment - 06/26/17 1115      Breath   Bilateral Breath Sounds Clear   Shortness of Breath Yes;Limiting activity;Panic with Shortness of Breath      Exercise Target Goals: Date: 06/29/17  Exercise Program Goal: Individual exercise prescription set with THRR, safety & activity barriers. Participant demonstrates ability to understand  and report RPE using BORG scale, to self-measure pulse accurately, and to acknowledge the importance of the exercise prescription.  Exercise Prescription Goal: Starting with aerobic activity 30 plus minutes a day, 3 days per week for initial exercise prescription. Provide home exercise prescription and guidelines that participant acknowledges understanding prior to discharge.  Activity Barriers & Risk Stratification:     Activity Barriers & Cardiac Risk Stratification - 06/26/17 1103      Activity Barriers & Cardiac Risk Stratification   Activity Barriers Deconditioning;Shortness of Breath;Muscular Weakness      6 Minute Walk:     6 Minute Walk    Row Name 06/29/17 0709         6 Minute Walk   Phase Initial     Distance 900 feet     Walk Time 6 minutes     # of Rest Breaks 0     MPH 1.7     METS 2.3     RPE 12     Perceived Dyspnea  1     Symptoms Yes (comment)     Comments used wheelchair     Resting HR 70 bpm     Resting BP 163/83     Resting Oxygen Saturation  95 %     Exercise Oxygen Saturation  during 6 min walk 88 %     Max Ex. HR 90 bpm     Max Ex. BP 177/72     2 Minute Post BP 167/71       Interval HR   1 Minute HR 80     2 Minute HR 84     3 Minute HR 84     4 Minute HR 88     5 Minute HR 90     6 Minute HR 90     2 Minute Post HR 73     Interval Heart Rate? Yes       Interval Oxygen   Interval Oxygen? Yes     Baseline Oxygen Saturation % 95 %     1 Minute Oxygen Saturation % 98 %     1 Minute Liters of Oxygen 3 L     2 Minute Oxygen Saturation % 95 %     2 Minute Liters of Oxygen 3 L     3 Minute Oxygen Saturation % 95 %     3 Minute Liters of Oxygen 3 L     4 Minute Oxygen Saturation % 99 %     4 Minute Liters of Oxygen 3 L     5 Minute Oxygen Saturation % 93 %  5 Minute Liters of Oxygen 3 L     6 Minute Oxygen Saturation % 88 %     6 Minute Liters of Oxygen 3 L     2 Minute Post Oxygen Saturation % 99 %     2 Minute Post Liters of  Oxygen 3 L        Oxygen Initial Assessment:     Oxygen Initial Assessment - 06/29/17 0716      Initial 6 min Walk   Oxygen Used Continuous;E-Tanks   Liters per minute 3     Program Oxygen Prescription   Program Oxygen Prescription Continuous;E-Tanks   Liters per minute 3      Oxygen Re-Evaluation:   Oxygen Discharge (Final Oxygen Re-Evaluation):   Initial Exercise Prescription:     Initial Exercise Prescription - 06/29/17 0700      Date of Initial Exercise RX and Referring Provider   Date 06/29/17   Referring Provider Dr. Melvyn Novas     NuStep   Level 2   Minutes 17   METs 1.2     Arm Ergometer   Level 1   Minutes 17     Track   Laps 7   Minutes 17     Prescription Details   Frequency (times per week) 2   Duration Progress to 45 minutes of aerobic exercise without signs/symptoms of physical distress     Intensity   THRR 40-80% of Max Heartrate 61-122   Ratings of Perceived Exertion 11-13   Perceived Dyspnea 0-4     Progression   Progression Continue progressive overload as per policy without signs/symptoms or physical distress.     Resistance Training   Training Prescription Yes   Weight orange bands   Reps 10-15      Perform Capillary Blood Glucose checks as needed.  Exercise Prescription Changes:   Exercise Comments:   Exercise Goals and Review:     Exercise Goals    Row Name 06/26/17 1103             Exercise Goals   Increase Physical Activity Yes       Intervention Provide advice, education, support and counseling about physical activity/exercise needs.;Develop an individualized exercise prescription for aerobic and resistive training based on initial evaluation findings, risk stratification, comorbidities and participant's personal goals.       Expected Outcomes Achievement of increased cardiorespiratory fitness and enhanced flexibility, muscular endurance and strength shown through measurements of functional capacity and personal  statement of participant.       Increase Strength and Stamina Yes       Intervention Provide advice, education, support and counseling about physical activity/exercise needs.;Develop an individualized exercise prescription for aerobic and resistive training based on initial evaluation findings, risk stratification, comorbidities and participant's personal goals.       Expected Outcomes Achievement of increased cardiorespiratory fitness and enhanced flexibility, muscular endurance and strength shown through measurements of functional capacity and personal statement of participant.       Able to understand and use rate of perceived exertion (RPE) scale Yes       Intervention Provide education and explanation on how to use RPE scale       Expected Outcomes Short Term: Able to use RPE daily in rehab to express subjective intensity level;Long Term:  Able to use RPE to guide intensity level when exercising independently       Able to understand and use Dyspnea scale Yes  Intervention Provide education and explanation on how to use Dyspnea scale       Expected Outcomes Short Term: Able to use Dyspnea scale daily in rehab to express subjective sense of shortness of breath during exertion;Long Term: Able to use Dyspnea scale to guide intensity level when exercising independently       Knowledge and understanding of Target Heart Rate Range (THRR) Yes       Intervention Provide education and explanation of THRR including how the numbers were predicted and where they are located for reference       Expected Outcomes Short Term: Able to state/look up THRR;Long Term: Able to use THRR to govern intensity when exercising independently;Short Term: Able to use daily as guideline for intensity in rehab       Understanding of Exercise Prescription Yes       Intervention Provide education, explanation, and written materials on patient's individual exercise prescription       Expected Outcomes Short Term: Able to  explain program exercise prescription;Long Term: Able to explain home exercise prescription to exercise independently          Exercise Goals Re-Evaluation :   Discharge Exercise Prescription (Final Exercise Prescription Changes):   Nutrition:  Target Goals: Understanding of nutrition guidelines, daily intake of sodium 1500mg , cholesterol 200mg , calories 30% from fat and 7% or less from saturated fats, daily to have 5 or more servings of fruits and vegetables.  Biometrics:     Pre Biometrics - 06/26/17 1118      Pre Biometrics   Grip Strength 18 kg       Nutrition Therapy Plan and Nutrition Goals:   Nutrition Discharge: Rate Your Plate Scores:   Nutrition Goals Re-Evaluation:   Nutrition Goals Discharge (Final Nutrition Goals Re-Evaluation):   Psychosocial: Target Goals: Acknowledge presence or absence of significant depression and/or stress, maximize coping skills, provide positive support system. Participant is able to verbalize types and ability to use techniques and skills needed for reducing stress and depression.  Initial Review & Psychosocial Screening:     Initial Psych Review & Screening - 06/26/17 1116      Initial Review   Current issues with None Identified     Family Dynamics   Good Support System? Yes     Barriers   Psychosocial barriers to participate in program There are no identifiable barriers or psychosocial needs.     Screening Interventions   Interventions Encouraged to exercise      Quality of Life Scores:   PHQ-9: Recent Review Flowsheet Data    Depression screen [facility] 2/9 06/26/2017 01/25/2016   Decreased Interest 0 0   Down, Depressed, Hopeless 0 0   PHQ - 2 Score 0 0   Altered sleeping 2 -   Tired, decreased energy 1 -   Change in appetite 0 -   Feeling bad or failure about yourself  0 -   Trouble concentrating 0 -   Moving slowly or fidgety/restless 0 -   Suicidal thoughts 0 -   PHQ-9 Score 3 -     Interpretation of  Total Score  Total Score Depression Severity:  1-4 = Minimal depression, 5-9 = Mild depression, 10-14 = Moderate depression, 15-19 = Moderately severe depression, 20-27 = Severe depression   Psychosocial Evaluation and Intervention:     Psychosocial Evaluation - 06/26/17 1116      Psychosocial Evaluation & Interventions   Interventions Encouraged to exercise with the program and follow exercise prescription  Expected Outcomes patient will remain free from psychosocial barriers to particitpation   Continue Psychosocial Services  No Follow up required      Psychosocial Re-Evaluation:   Psychosocial Discharge (Final Psychosocial Re-Evaluation):   Education: Education Goals: Education classes will be provided on a weekly basis, covering required topics. Participant will state understanding/return demonstration of topics presented.  Learning Barriers/Preferences:     Learning Barriers/Preferences - 06/26/17 1115      Learning Barriers/Preferences   Learning Barriers None   Learning Preferences Computer/Internet;Individual Instruction;Verbal Instruction;Written Material      Education Topics: Risk Factor Reduction:  -Group instruction that is supported by a PowerPoint presentation. Instructor discusses the definition of a risk factor, different risk factors for pulmonary disease, and how the heart and lungs work together.     Nutrition for Pulmonary Patient:  -Group instruction provided by PowerPoint slides, verbal discussion, and written materials to support subject matter. The instructor gives an explanation and review of healthy diet recommendations, which includes a discussion on weight management, recommendations for fruit and vegetable consumption, as well as protein, fluid, caffeine, fiber, sodium, sugar, and alcohol. Tips for eating when patients are short of breath are discussed.   Pursed Lip Breathing:  -Group instruction that is supported by demonstration and  informational handouts. Instructor discusses the benefits of pursed lip and diaphragmatic breathing and detailed demonstration on how to preform both.     Oxygen Safety:  -Group instruction provided by PowerPoint, verbal discussion, and written material to support subject matter. There is an overview of "What is Oxygen" and "Why do we need it".  Instructor also reviews how to create a safe environment for oxygen use, the importance of using oxygen as prescribed, and the risks of noncompliance. There is a brief discussion on traveling with oxygen and resources the patient may utilize.   Oxygen Equipment:  -Group instruction provided by [facility] Staff utilizing handouts, written materials, and equipment demonstrations.   Signs and Symptoms:  -Group instruction provided by written material and verbal discussion to support subject matter. Warning signs and symptoms of infection, stroke, and heart attack are reviewed and when to call the physician/911 reinforced. Tips for preventing the spread of infection discussed.   Advanced Directives:  -Group instruction provided by verbal instruction and written material to support subject matter. Instructor reviews Advanced Directive laws and proper instruction for filling out document.   Pulmonary Video:  -Group video education that reviews the importance of medication and oxygen compliance, exercise, good nutrition, pulmonary hygiene, and pursed lip and diaphragmatic breathing for the pulmonary patient.   Exercise for the Pulmonary Patient:  -Group instruction that is supported by a PowerPoint presentation. Instructor discusses benefits of exercise, core components of exercise, frequency, duration, and intensity of an exercise routine, importance of utilizing pulse oximetry during exercise, safety while exercising, and options of places to exercise outside of rehab.     Pulmonary Medications:  -Verbally interactive group education provided by  instructor with focus on inhaled medications and proper administration.   Anatomy and Physiology of the Respiratory System and Intimacy:  -Group instruction provided by PowerPoint, verbal discussion, and written material to support subject matter. Instructor reviews respiratory cycle and anatomical components of the respiratory system and their functions. Instructor also reviews differences in obstructive and restrictive respiratory diseases with examples of each. Intimacy, Sex, and Sexuality differences are reviewed with a discussion on how relationships can change when diagnosed with pulmonary disease. Common sexual concerns are reviewed.   MD DAY -  A group question and answer session with a medical doctor that allows participants to ask questions that relate to their pulmonary disease state.   OTHER EDUCATION -Group or individual verbal, written, or video instructions that support the educational goals of the pulmonary rehab program.   Knowledge Questionnaire Score:     Knowledge Questionnaire Score - 06/28/17 1401      Knowledge Questionnaire Score   Pre Score 11/13      Core Components/Risk Factors/Patient Goals at Admission:     Personal Goals and Risk Factors at Admission - 06/26/17 1115      Core Components/Risk Factors/Patient Goals on Admission   Improve shortness of breath with ADL's Yes   Intervention Provide education, individualized exercise plan and daily activity instruction to help decrease symptoms of SOB with activities of daily living.   Expected Outcomes Short Term: Achieves a reduction of symptoms when performing activities of daily living.   Develop more efficient breathing techniques such as purse lipped breathing and diaphragmatic breathing; and practicing self-pacing with activity Yes   Intervention Provide education, demonstration and support about specific breathing techniuqes utilized for more efficient breathing. Include techniques such as pursed lipped  breathing, diaphragmatic breathing and self-pacing activity.   Expected Outcomes Short Term: Participant will be able to demonstrate and use breathing techniques as needed throughout daily activities.      Core Components/Risk Factors/Patient Goals Review:    Core Components/Risk Factors/Patient Goals at Discharge (Final Review):    ITP Comments:   Comments:

## 2017-07-04 ENCOUNTER — Encounter (HOSPITAL_COMMUNITY)
Admission: RE | Admit: 2017-07-04 | Discharge: 2017-07-04 | Disposition: A | Discharge status: Home / Self Care | Payer: Medicare Other | Source: Ambulatory Visit | Attending: Internal Medicine | Admitting: Internal Medicine

## 2017-07-04 DIAGNOSIS — J9611 Chronic respiratory failure with hypoxia: Secondary | ICD-10-CM | POA: Insufficient documentation

## 2017-07-04 NOTE — Progress Notes (Signed)
Daily Session Note  Patient Details  Name: [name] MRN: [id number] Date of Birth: Oct 10, 1950 Referring Provider:     Pulmonary Rehab Walk Test from 06/27/2017 in [place]  Referring Provider  Dr. Melvyn Novas      Encounter Date: 07/04/2017  Check In:     Session Check In - 07/04/17 1215      Check-In   Location [facility]   Staff Present Su Hilt, MS, ACSM RCEP, Exercise Physiologist;Anthonette Lesage Ysidro Evert, RN   Supervising physician immediately available to respond to emergencies [facility] immediately available   Physician(s) Dr. Wendee Beavers   Medication changes reported     No   Fall or balance concerns reported    No   Tobacco Cessation No Change   Warm-up and Cool-down Performed as group-led instruction   Resistance Training Performed Yes   VAD Patient? No     Pain Assessment   Currently in Pain? No/denies   Multiple Pain Sites No      Capillary Blood Glucose: No results found for this or any previous visit (from the past 24 hour(s)).      Exercise Prescription Changes - 07/04/17 1200      Response to Exercise   Blood Pressure (Admit) 134/90   Blood Pressure (Exercise) 140/80   Blood Pressure (Exit) 100/70   Heart Rate (Admit) 81 bpm   Heart Rate (Exercise) 86 bpm   Heart Rate (Exit) 68 bpm   Oxygen Saturation (Admit) 97 %   Oxygen Saturation (Exercise) 98 %   Oxygen Saturation (Exit) 97 %   Rating of Perceived Exertion (Exercise) 14   Perceived Dyspnea (Exercise) 2   Duration Progress to 45 minutes of aerobic exercise without signs/symptoms of physical distress   Intensity Other (comment)  40-80% of HRR     Progression   Progression Continue to progress workloads to maintain intensity without signs/symptoms of physical distress.     Resistance Training   Training Prescription Yes   Weight orange bands   Reps 10-15   Time 10 Minutes     Interval Training   Interval Training Yes     Oxygen   Oxygen Continuous   Liters 3     NuStep   Level 2   Minutes 17   METs 1.5     Arm Ergometer   Level 1   Minutes 17     Track   Laps 6   Minutes 17      History  Smoking Status  . Former Smoker  . Packs/day: 0.50  . Years: 20.00  . Types: Cigarettes  . Quit date: 11/03/2008  Smokeless Tobacco  . Never Used    Goals Met:  Exercise tolerated well No report of cardiac concerns or symptoms Strength training completed today  Goals Unmet:  Not Applicable  Comments: Service time is from 1030 to 1210    Dr. Rush Farmer is Medical Director for Pulmonary Rehab at [facility].

## 2017-07-06 ENCOUNTER — Encounter (HOSPITAL_COMMUNITY)
Admission: RE | Admit: 2017-07-06 | Discharge: 2017-07-06 | Disposition: A | Discharge status: Home / Self Care | Payer: Medicare Other | Source: Ambulatory Visit | Attending: Internal Medicine | Admitting: Internal Medicine

## 2017-07-06 VITALS — Wt 107.1 lb

## 2017-07-06 DIAGNOSIS — J9611 Chronic respiratory failure with hypoxia: Secondary | ICD-10-CM

## 2017-07-06 NOTE — Progress Notes (Signed)
Daily Session Note  Patient Details  Name: Alejandra Hunt MRN: [id number] Date of Birth: 28-Nov-1950 Referring Provider:     Pulmonary Rehab Walk Test from 06/27/2017 in [place]  Referring Provider  Dr. Melvyn Novas      Encounter Date: 07/06/2017  Check In:     Session Check In - 07/06/17 1252      Check-In   [facility]   Staff Present Su Hilt, MS, ACSM RCEP, Exercise Physiologist;Angelino Rumery Ysidro Evert, RN   Supervising physician immediately available to respond to emergencies [facility] immediately available   Physician(s) Dr. Zigmund Daniel   Medication changes reported     No   Fall or balance concerns reported    No   Tobacco Cessation No Change   Warm-up and Cool-down Performed as group-led instruction   Resistance Training Performed Yes   VAD Patient? No     Pain Assessment   Currently in Pain? No/denies   Multiple Pain Sites No      Capillary Blood Glucose: No results found for this or any previous visit (from the past 24 hour(s)).      Exercise Prescription Changes - 07/06/17 1300      Response to Exercise   Blood Pressure (Admit) 134/74   Blood Pressure (Exercise) 140/78   Blood Pressure (Exit) 108/70   Heart Rate (Admit) 75 bpm   Heart Rate (Exercise) 81 bpm   Heart Rate (Exit) 64 bpm   Oxygen Saturation (Admit) 98 %   Oxygen Saturation (Exercise) 94 %   Oxygen Saturation (Exit) 99 %   Rating of Perceived Exertion (Exercise) 13   Perceived Dyspnea (Exercise) 2   Duration Progress to 45 minutes of aerobic exercise without signs/symptoms of physical distress   Intensity THRR unchanged     Progression   Progression Continue to progress workloads to maintain intensity without signs/symptoms of physical distress.     Resistance Training   Training Prescription Yes   Weight orange bands   Reps 10-15   Time 10 Minutes     Interval Training   Interval Training No     Oxygen   Oxygen Continuous    Liters 3     NuStep   Level 2   Minutes 17   METs 1.6     Track   Laps 10   Minutes 17      History  Smoking Status  . Former Smoker  . Packs/day: 0.50  . Years: 20.00  . Types: Cigarettes  . Quit date: 11/03/2008  Smokeless Tobacco  . Never Used    Goals Met:  Exercise tolerated well No report of cardiac concerns or symptoms Strength training completed today  Goals Unmet:  Not Applicable  Comments: Service time is from 1030 to 1225    Dr. Rush Farmer is Medical Director for Pulmonary Rehab at [facility].

## 2017-07-11 ENCOUNTER — Encounter (HOSPITAL_COMMUNITY)
Admission: RE | Admit: 2017-07-11 | Discharge: 2017-07-11 | Disposition: A | Discharge status: Home / Self Care | Payer: Medicare Other | Source: Ambulatory Visit | Attending: Internal Medicine | Admitting: Internal Medicine

## 2017-07-11 VITALS — Wt 108.9 lb

## 2017-07-11 DIAGNOSIS — J9611 Chronic respiratory failure with hypoxia: Secondary | ICD-10-CM | POA: Diagnosis not present

## 2017-07-11 NOTE — Progress Notes (Signed)
Daily Session Note  Patient Details  Name: [name] MRN: [id number] Date of Birth: 04-Jun-1951 Referring Provider:     Pulmonary Rehab Walk Test from 06/27/2017 in [place]  Referring Provider  Dr. Melvyn Novas      Encounter Date: 07/11/2017  Check In:     Session Check In - 07/11/17 1105      Check-In   [facility]   Staff Present Su Hilt, MS, ACSM RCEP, Exercise Physiologist;Lisa Ysidro Evert, RN;Portia Rollene Rotunda, RN, BSN   Supervising physician immediately available to respond to emergencies [facility] immediately available   Physician(s) Dr. Zigmund Daniel   Medication changes reported     No   Fall or balance concerns reported    No   Tobacco Cessation No Change   Warm-up and Cool-down Performed as group-led instruction   Resistance Training Performed Yes   VAD Patient? No     Pain Assessment   Currently in Pain? No/denies   Multiple Pain Sites No      Capillary Blood Glucose: No results found for this or any previous visit (from the past 24 hour(s)).      Exercise Prescription Changes - 07/11/17 1300      Response to Exercise   Blood Pressure (Admit) 102/64   Blood Pressure (Exercise) 140/76   Blood Pressure (Exit) 104/70   Heart Rate (Admit) 77 bpm   Heart Rate (Exercise) 82 bpm   Heart Rate (Exit) 73 bpm   Oxygen Saturation (Admit) 100 %   Oxygen Saturation (Exercise) 98 %   Oxygen Saturation (Exit) 98 %   Rating of Perceived Exertion (Exercise) 11   Perceived Dyspnea (Exercise) 1   Duration Progress to 45 minutes of aerobic exercise without signs/symptoms of physical distress   Intensity THRR unchanged     Progression   Progression Continue to progress workloads to maintain intensity without signs/symptoms of physical distress.     Resistance Training   Training Prescription Yes   Weight orange bands   Reps 10-15   Time 10 Minutes     Interval Training   Interval Training No     Oxygen   Oxygen Continuous   Liters 3     NuStep   Level 2   Minutes 17   METs 1.8     Track   Laps 9   Minutes 17      History  Smoking Status  . Former Smoker  . Packs/day: 0.50  . Years: 20.00  . Types: Cigarettes  . Quit date: 11/03/2008  Smokeless Tobacco  . Never Used    Goals Met:  Exercise tolerated well No report of cardiac concerns or symptoms Strength training completed today  Goals Unmet:  Not Applicable  Comments: Service time is from 10:30a to 12:30p    Dr. Rush Farmer is Medical Director for Pulmonary Rehab at [facility].

## 2017-07-13 ENCOUNTER — Encounter (HOSPITAL_COMMUNITY)
Admission: RE | Admit: 2017-07-13 | Discharge: 2017-07-13 | Disposition: A | Discharge status: Home / Self Care | Payer: Medicare Other | Source: Ambulatory Visit | Attending: Internal Medicine | Admitting: Internal Medicine

## 2017-07-13 VITALS — Wt 108.5 lb

## 2017-07-13 DIAGNOSIS — J9611 Chronic respiratory failure with hypoxia: Secondary | ICD-10-CM

## 2017-07-13 NOTE — Progress Notes (Signed)
Aveline Daus 66 y.o. female  DOB: 1951-03-11 MRN: [id number]           Nutrition Note 1. Chronic respiratory failure with hypoxia [facility])    Past Medical History:  Diagnosis Date  . Acute on chronic respiratory failure ([place]) 01/17/2016  . Chronic bronchitis   . COPD (chronic obstructive pulmonary disease) ([place])   . Hyperlipidemia   . Hypertension   . Hypothyroidism   . Post-menopausal    Meds reviewed. Remeron noted  Ht: Ht Readings from Last 1 Encounters:  06/26/17 5\' 1"  (1.549 m)     Wt:  Wt Readings from Last 3 Encounters:  07/13/17 108 lb 7.5 oz (49.2 kg)  07/11/17 108 lb 14.5 oz (49.4 kg)  07/06/17 107 lb 2.3 oz (48.6 kg)    BMI: 20.5    Current tobacco use? No  Labs:  Lipid Panel  No results found for: CHOL, TRIG, HDL, CHOLHDL, VLDL, LDLCALC, LDLDIRECT  No results found for: HGBA1C Note Spoke with pt. Pt is at a low-normal wt. Per discussion, pt's current wt is her UBW. Pt c/o decreased appetite since she retired 9 years ago. Pt feels her appetite decreased due to decreased activity. There are some ways the pt can make her eating habits healthier. Pt's Rate Your Plate results reviewed with pt. Pt does not avoid salty food.  Pt adds salt to food. The role of sodium in lung disease reviewed with pt. Pt expressed understanding of the information reviewed via feedback method.    Nutrition Diagnosis ? Food-and nutrition-related knowledge deficit related to lack of exposure to information as related to diagnosis of pulmonary disease  Nutrition Intervention ? Pt's individual nutrition plan and goals reviewed with pt. ? Benefits of adopting healthy eating habits discussed when pt's Rate Your Plate reviewed.  Goal(s) 1. Pt to identify and limit food sources of sodium. 2. The pt will recognize symptoms that can interfere with adequate oral intake, such as shortness of breath, N/V, early satiety, fatigue, ability to secure and prepare food, taste and smell changes,  chewing/swallowing difficulties, and/ or pain when eating. 3. The pt will consume high-energy, high-nutrient dense beverages when necessary to compensate for decreased oral intake of solid foods. Plan:  Pt to attend Pulmonary Nutrition class Will provide client-centered nutrition education as part of interdisciplinary care.   Monitor and evaluate progress toward nutrition goal with team.  Monitor and Evaluate progress toward nutrition goal with team.   Derek Mound, M.Ed, RD, LDN, CDE 07/13/2017 12:18 PM

## 2017-07-13 NOTE — Progress Notes (Signed)
Daily Session Note  Patient Details  Name: [name] MRN: [id number] Date of Birth: Feb 26, 1951 Referring Provider:     Pulmonary Rehab Walk Test from 06/27/2017 in [place]  Referring Provider  Dr. Melvyn Novas      Encounter Date: 07/13/2017  Check In:     Session Check In - 07/13/17 1031      Check-In   Location [facility]   Staff Present Trish Fountain, RN, BSN;Lisa Ysidro Evert, RN;Acire Tang, MS, ACSM RCEP, Exercise Physiologist   Supervising physician immediately available to respond to emergencies [facility] immediately available   Physician(s) Dr. Verlon Au   Medication changes reported     No   Fall or balance concerns reported    No   Tobacco Cessation No Change   Warm-up and Cool-down Performed as group-led instruction   Resistance Training Performed Yes   VAD Patient? No     Pain Assessment   Currently in Pain? No/denies   Multiple Pain Sites No      Capillary Blood Glucose: No results found for this or any previous visit (from the past 24 hour(s)).      Exercise Prescription Changes - 07/13/17 1200      Response to Exercise   Blood Pressure (Admit) 154/80   Blood Pressure (Exercise) 120/70   Blood Pressure (Exit) 156/77   Heart Rate (Admit) 77 bpm   Heart Rate (Exercise) 85 bpm   Heart Rate (Exit) 67 bpm   Oxygen Saturation (Admit) 100 %   Oxygen Saturation (Exercise) 98 %   Oxygen Saturation (Exit) 99 %   Rating of Perceived Exertion (Exercise) 12   Perceived Dyspnea (Exercise) 1   Duration Progress to 45 minutes of aerobic exercise without signs/symptoms of physical distress   Intensity THRR unchanged     Progression   Progression Continue to progress workloads to maintain intensity without signs/symptoms of physical distress.     Resistance Training   Training Prescription Yes   Weight orange bands   Reps 10-15   Time 10 Minutes     Interval Training   Interval Training No     Oxygen   Oxygen Continuous   Liters 3     NuStep   Level 2   Minutes 17   METs 1.3     Arm Ergometer   Level 2   Minutes 17     Track   Laps 8   Minutes 17      History  Smoking Status  . Former Smoker  . Packs/day: 0.50  . Years: 20.00  . Types: Cigarettes  . Quit date: 11/03/2008  Smokeless Tobacco  . Never Used    Goals Met:  Exercise tolerated well No report of cardiac concerns or symptoms Strength training completed today  Goals Unmet:  Not Applicable  Comments: Service time is from 10:30a to 12:10p    Dr. Rush Farmer is Medical Director for Pulmonary Rehab at [facility].

## 2017-07-18 ENCOUNTER — Encounter (HOSPITAL_COMMUNITY)
Admission: RE | Admit: 2017-07-18 | Discharge: 2017-07-18 | Disposition: A | Discharge status: Home / Self Care | Payer: Medicare Other | Source: Ambulatory Visit | Attending: Internal Medicine | Admitting: Internal Medicine

## 2017-07-18 VITALS — Wt 108.5 lb

## 2017-07-18 DIAGNOSIS — J9611 Chronic respiratory failure with hypoxia: Secondary | ICD-10-CM | POA: Diagnosis not present

## 2017-07-18 NOTE — Progress Notes (Signed)
Daily Session Note  Patient Details  Name: [name] MRN: [id number] Date of Birth: 14-Jun-1951 Referring Provider:     Pulmonary Rehab Walk Test from 06/27/2017 in [place]  Referring Provider  Dr. Melvyn Novas      Encounter Date: 07/18/2017  Check In:     Session Check In - 07/18/17 1217      Check-In   Location [facility]   Staff Present Rodney Langton, RN;Portia Rollene Rotunda, RN, BSN;Angle Dirusso, MS, ACSM RCEP, Exercise Physiologist   Supervising physician immediately available to respond to emergencies [facility] immediately available   Physician(s) Dr. Verlon Au   Medication changes reported     No   Fall or balance concerns reported    No   Tobacco Cessation No Change   Warm-up and Cool-down Performed as group-led instruction   Resistance Training Performed Yes   VAD Patient? No     Pain Assessment   Currently in Pain? No/denies   Multiple Pain Sites No      Capillary Blood Glucose: No results found for this or any previous visit (from the past 24 hour(s)).      Exercise Prescription Changes - 07/18/17 1200      Response to Exercise   Blood Pressure (Admit) 122/68   Blood Pressure (Exercise) 122/80   Blood Pressure (Exit) 106/64   Heart Rate (Admit) 75 bpm   Heart Rate (Exercise) 92 bpm   Heart Rate (Exit) 66 bpm   Oxygen Saturation (Admit) 99 %   Oxygen Saturation (Exercise) 95 %   Oxygen Saturation (Exit) 97 %   Rating of Perceived Exertion (Exercise) 13   Perceived Dyspnea (Exercise) 2   Symptoms 1   Duration Progress to 45 minutes of aerobic exercise without signs/symptoms of physical distress   Intensity THRR unchanged     Progression   Progression Continue to progress workloads to maintain intensity without signs/symptoms of physical distress.     Resistance Training   Training Prescription Yes   Weight orange bands   Reps 10-15   Time 10 Minutes     Interval Training   Interval Training  No     Oxygen   Oxygen Continuous   Liters 3     NuStep   Level 3   Minutes 17   METs 1.7     Arm Ergometer   Level 3   Minutes 17     Track   Laps 11   Minutes 17      History  Smoking Status  . Former Smoker  . Packs/day: 0.50  . Years: 20.00  . Types: Cigarettes  . Quit date: 11/03/2008  Smokeless Tobacco  . Never Used    Goals Met:    Goals Unmet:  Not Applicable  Comments: Service time is from 10:30a to 12:10p    Dr. Rush Farmer is Medical Director for Pulmonary Rehab at [facility].

## 2017-07-20 ENCOUNTER — Encounter (HOSPITAL_COMMUNITY)
Admission: RE | Admit: 2017-07-20 | Discharge: 2017-07-20 | Disposition: A | Discharge status: Home / Self Care | Payer: Medicare Other | Source: Ambulatory Visit | Attending: Internal Medicine | Admitting: Internal Medicine

## 2017-07-20 VITALS — Wt 108.7 lb

## 2017-07-20 DIAGNOSIS — J9611 Chronic respiratory failure with hypoxia: Secondary | ICD-10-CM | POA: Diagnosis not present

## 2017-07-20 NOTE — Progress Notes (Signed)
Daily Session Note  Patient Details  Name: [name] MRN: [id number] Date of Birth: Jan 26, 1951 Referring Provider:     Pulmonary Rehab Walk Test from 06/27/2017 in [place]  Referring Provider  Dr. Melvyn Novas      Encounter Date: 07/20/2017  Check In:     Session Check In - 07/20/17 1025      Check-In   Location [facility]   Staff Present Su Hilt, MS, ACSM RCEP, Exercise Physiologist;Berma Harts Ysidro Evert, RN;Portia Rollene Rotunda, RN, BSN   Supervising physician immediately available to respond to emergencies [facility] immediately available   Physician(s) Dr. Horris Latino   Medication changes reported     No   Fall or balance concerns reported    No   Tobacco Cessation No Change   Warm-up and Cool-down Performed as group-led instruction   Resistance Training Performed Yes   VAD Patient? No     Pain Assessment   Currently in Pain? No/denies   Multiple Pain Sites No      Capillary Blood Glucose: No results found for this or any previous visit (from the past 24 hour(s)).      Exercise Prescription Changes - 07/20/17 1200      Response to Exercise   Blood Pressure (Admit) 134/80   Blood Pressure (Exercise) 130/70   Blood Pressure (Exit) 110/74   Heart Rate (Admit) 81 bpm   Heart Rate (Exercise) 92 bpm   Heart Rate (Exit) 72 bpm   Oxygen Saturation (Admit) 100 %   Oxygen Saturation (Exercise) 94 %   Oxygen Saturation (Exit) 98 %   Rating of Perceived Exertion (Exercise) 15   Perceived Dyspnea (Exercise) 3   Duration Progress to 45 minutes of aerobic exercise without signs/symptoms of physical distress   Intensity THRR unchanged     Progression   Progression Continue to progress workloads to maintain intensity without signs/symptoms of physical distress.     Resistance Training   Training Prescription Yes   Weight orange bands   Reps 10-15   Time 10 Minutes     Interval Training   Interval Training No     Oxygen   Oxygen Continuous   Liters 3     Arm Ergometer   Level 3   Minutes 17     Track   Laps 10   Minutes 17      History  Smoking Status  . Former Smoker  . Packs/day: 0.50  . Years: 20.00  . Types: Cigarettes  . Quit date: 11/03/2008  Smokeless Tobacco  . Never Used    Goals Met:  Exercise tolerated well No report of cardiac concerns or symptoms Strength training completed today  Goals Unmet:  Not Applicable  Comments: Service time is from 1030 to 1235    Dr. Rush Farmer is Medical Director for Pulmonary Rehab at [facility].

## 2017-07-24 NOTE — Progress Notes (Signed)
Pulmonary Individual Treatment Plan  Patient Details  Name: [name] MRN: [id number] Date of Birth: [birth year] Referring Provider:     Pulmonary Rehab Walk Test from 06/27/2017 in [facility]  Referring Provider  Dr. Melvyn Novas      Initial Encounter Date:    Pulmonary Rehab Walk Test from 06/27/2017 in [place]  Date  06/29/17  Referring Provider  Dr. Melvyn Novas      Visit Diagnosis: Chronic respiratory failure with hypoxia ([place])  Patient's Home Medications on Admission:   Current Outpatient Prescriptions:  .  albuterol (PROVENTIL HFA;VENTOLIN HFA) 108 (90 BASE) MCG/ACT inhaler, Inhale 2 puffs into the lungs every 4 (four) hours as needed for wheezing or shortness of breath., Disp: 18 g, Rfl: 0 .  albuterol (PROVENTIL) (2.5 MG/3ML) 0.083% nebulizer solution, Take 3 mLs (2.5 mg total) by nebulization every 2 (two) hours as needed for wheezing or shortness of breath. (Patient not taking: Reported on 06/26/2017), Disp: 75 mL, Rfl: 0 .  alendronate (FOSAMAX) 70 MG tablet, TAKE 1 TABLET ONCE A WEEK, Disp: , Rfl: 11 .  aspirin EC 81 MG tablet, Take 81 mg by mouth daily., Disp: , Rfl:  .  atenolol (TENORMIN) 25 MG tablet, Take 25 mg by mouth daily., Disp: , Rfl:  .  Cyanocobalamin (VITAMIN B-12 PO), Take 1 tablet by mouth daily., Disp: , Rfl:  .  Glycopyrrolate-Formoterol (BEVESPI AEROSPHERE) 9-4.8 MCG/ACT AERO, Inhale 2 puffs into the lungs 2 (two) times daily., Disp: 2 Inhaler, Rfl: 0 .  guaiFENesin (MUCINEX) 600 MG 12 hr tablet, Take 1 tablet (600 mg total) by mouth 2 (two) times daily. (Patient not taking: Reported on 06/26/2017), Disp: 15 tablet, Rfl: 0 .  levothyroxine (SYNTHROID, LEVOTHROID) 75 MCG tablet, Take 75 mcg by mouth daily., Disp: , Rfl: 3 .  lovastatin (MEVACOR) 20 MG tablet, Take 20 mg by mouth at bedtime., Disp: , Rfl:  .  mirtazapine (REMERON) 15 MG tablet, Take 15 mg by mouth at bedtime as needed. sleep, Disp: ,  Rfl:  .  Multiple Vitamin (MULTIVITAMIN WITH MINERALS) TABS tablet, Take 1 tablet by mouth daily., Disp: , Rfl:  .  OXYGEN, Pt uses 2lpm with sleep and 3 lpm with exertion  DME- [facility], Disp: , Rfl:  .  Tiotropium Bromide-Olodaterol (STIOLTO RESPIMAT) 2.5-2.5 MCG/ACT AERS, Inhale 2 puffs into the lungs daily., Disp: 2 Inhaler, Rfl: 0  Past Medical History: Past Medical History:  Diagnosis Date  . Acute on chronic respiratory failure ([place]) 01/17/2016  . Chronic bronchitis   . COPD (chronic obstructive pulmonary disease) ([place])   . Hyperlipidemia   . Hypertension   . Hypothyroidism   . Post-menopausal     Tobacco Use: History  Smoking Status  . Former Smoker  . Packs/day: 0.50  . Years: 20.00  . Types: Cigarettes  . Quit date: 11/03/2008  Smokeless Tobacco  . Never Used    Labs: Recent Review Flowsheet Data    Labs for ITP Cardiac and Pulmonary Rehab Latest Ref Rng & Units 10/22/2008 10/22/2008 01/17/2016   PHART 7.350 - 7.400 - 7.455(H) -   PCO2ART 35.0 - 45.0 mmHg - 38.0 -   HCO3 20.0 - 24.0 mEq/L - 26.3(H) 29.5(H)   TCO2 0 - 100 mmol/L 26 23.5 31   O2SAT % - 81.1 35.0      Capillary Blood Glucose: Lab Results  Component Value Date   GLUCAP 158 (H) 01/22/2016   GLUCAP 149 (H) 01/22/2016  GLUCAP 147 (H) 01/21/2016   GLUCAP 175 (H) 01/21/2016   GLUCAP 207 (H) 01/21/2016     Pulmonary Assessment Scores:     Pulmonary Assessment Scores    Row Name 06/28/17 1403         ADL UCSD   ADL Phase Entry     SOB Score total 36       CAT Score   CAT Score 16  Entry        Pulmonary Function Assessment:     Pulmonary Function Assessment - 06/26/17 1115      Breath   Bilateral Breath Sounds Clear   Shortness of Breath Yes;Limiting activity;Panic with Shortness of Breath      Exercise Target Goals:    Exercise Program Goal: Individual exercise prescription set with THRR, safety & activity barriers. Participant demonstrates ability to understand and report RPE  using BORG scale, to self-measure pulse accurately, and to acknowledge the importance of the exercise prescription.  Exercise Prescription Goal: Starting with aerobic activity 30 plus minutes a day, 3 days per week for initial exercise prescription. Provide home exercise prescription and guidelines that participant acknowledges understanding prior to discharge.  Activity Barriers & Risk Stratification:     Activity Barriers & Cardiac Risk Stratification - 06/26/17 1103      Activity Barriers & Cardiac Risk Stratification   Activity Barriers Deconditioning;Shortness of Breath;Muscular Weakness      6 Minute Walk:     6 Minute Walk    Row Name 06/29/17 0709         6 Minute Walk   Phase Initial     Distance 900 feet     Walk Time 6 minutes     # of Rest Breaks 0     MPH 1.7     METS 2.3     RPE 12     Perceived Dyspnea  1     Symptoms Yes (comment)     Comments used wheelchair     Resting HR 70 bpm     Resting BP 163/83     Resting Oxygen Saturation  95 %     Exercise Oxygen Saturation  during 6 min walk 88 %     Max Ex. HR 90 bpm     Max Ex. BP 177/72     2 Minute Post BP 167/71       Interval HR   1 Minute HR 80     2 Minute HR 84     3 Minute HR 84     4 Minute HR 88     5 Minute HR 90     6 Minute HR 90     2 Minute Post HR 73     Interval Heart Rate? Yes       Interval Oxygen   Interval Oxygen? Yes     Baseline Oxygen Saturation % 95 %     1 Minute Oxygen Saturation % 98 %     1 Minute Liters of Oxygen 3 L     2 Minute Oxygen Saturation % 95 %     2 Minute Liters of Oxygen 3 L     3 Minute Oxygen Saturation % 95 %     3 Minute Liters of Oxygen 3 L     4 Minute Oxygen Saturation % 99 %     4 Minute Liters of Oxygen 3 L     5 Minute Oxygen Saturation % 93 %  5 Minute Liters of Oxygen 3 L     6 Minute Oxygen Saturation % 88 %     6 Minute Liters of Oxygen 3 L     2 Minute Post Oxygen Saturation % 99 %     2 Minute Post Liters of Oxygen 3 L         Oxygen Initial Assessment:     Oxygen Initial Assessment - 06/29/17 0716      Initial 6 min Walk   Oxygen Used Continuous;E-Tanks   Liters per minute 3     Program Oxygen Prescription   Program Oxygen Prescription Continuous;E-Tanks   Liters per minute 3      Oxygen Re-Evaluation:     Oxygen Re-Evaluation    Row Name 07/23/17 2148             Program Oxygen Prescription   Program Oxygen Prescription Continuous;E-Tanks       Liters per minute 3         Home Oxygen   Home Oxygen Device E-Tanks;Home Concentrator       Sleep Oxygen Prescription Continuous       Liters per minute 2       Home Exercise Oxygen Prescription Continuous       Liters per minute 3       Home at Rest Exercise Oxygen Prescription Continuous       Liters per minute 2       Compliance with Home Oxygen Use Yes         Goals/Expected Outcomes   Short Term Goals To learn and exhibit compliance with exercise, home and travel O2 prescription;To learn and understand importance of monitoring SPO2 with pulse oximeter and demonstrate accurate use of the pulse oximeter.;To learn and demonstrate proper pursed lip breathing techniques or other breathing techniques.;To learn and understand importance of maintaining oxygen saturations>88%;To learn and demonstrate proper use of respiratory medications       Long  Term Goals Exhibits compliance with exercise, home and travel O2 prescription;Verbalizes importance of monitoring SPO2 with pulse oximeter and return demonstration;Exhibits proper breathing techniques, such as pursed lip breathing or other method taught during program session;Maintenance of O2 saturations>88%;Compliance with respiratory medication       Comments patient verbalizes compliance with home o2 and demonstrates personal spo2 monitoring.          Oxygen Discharge (Final Oxygen Re-Evaluation):     Oxygen Re-Evaluation - 07/23/17 2148      Program Oxygen Prescription   Program Oxygen  Prescription Continuous;E-Tanks   Liters per minute 3     Home Oxygen   Home Oxygen Device E-Tanks;Home Concentrator   Sleep Oxygen Prescription Continuous   Liters per minute 2   Home Exercise Oxygen Prescription Continuous   Liters per minute 3   Home at Rest Exercise Oxygen Prescription Continuous   Liters per minute 2   Compliance with Home Oxygen Use Yes     Goals/Expected Outcomes   Short Term Goals To learn and exhibit compliance with exercise, home and travel O2 prescription;To learn and understand importance of monitoring SPO2 with pulse oximeter and demonstrate accurate use of the pulse oximeter.;To learn and demonstrate proper pursed lip breathing techniques or other breathing techniques.;To learn and understand importance of maintaining oxygen saturations>88%;To learn and demonstrate proper use of respiratory medications   Long  Term Goals Exhibits compliance with exercise, home and travel O2 prescription;Verbalizes importance of monitoring SPO2 with pulse oximeter and return demonstration;Exhibits proper breathing techniques,  such as pursed lip breathing or other method taught during program session;Maintenance of O2 saturations>88%;Compliance with respiratory medication   Comments patient verbalizes compliance with home o2 and demonstrates personal spo2 monitoring.      Initial Exercise Prescription:     Initial Exercise Prescription - 06/29/17 0700      Date of Initial Exercise RX and Referring Provider   Date 06/29/17   Referring Provider Dr. Melvyn Novas     NuStep   Level 2   Minutes 17   METs 1.2     Arm Ergometer   Level 1   Minutes 17     Track   Laps 7   Minutes 17     Prescription Details   Frequency (times per week) 2   Duration Progress to 45 minutes of aerobic exercise without signs/symptoms of physical distress     Intensity   THRR 40-80% of Max Heartrate 61-122   Ratings of Perceived Exertion 11-13   Perceived Dyspnea 0-4     Progression    Progression Continue progressive overload as per policy without signs/symptoms or physical distress.     Resistance Training   Training Prescription Yes   Weight orange bands   Reps 10-15      Perform Capillary Blood Glucose checks as needed.  Exercise Prescription Changes:     Exercise Prescription Changes    Row Name 07/04/17 1200 07/06/17 1300 07/11/17 1300 07/13/17 1200 07/18/17 1200     Response to Exercise   Blood Pressure (Admit) 134/90 134/74 102/64 154/80 122/68   Blood Pressure (Exercise) 140/80 140/78 140/76 120/70 122/80   Blood Pressure (Exit) 100/70 108/70 104/70 156/77 106/64   Heart Rate (Admit) 81 bpm 75 bpm 77 bpm 77 bpm 75 bpm   Heart Rate (Exercise) 86 bpm 81 bpm 82 bpm 85 bpm 92 bpm   Heart Rate (Exit) 68 bpm 64 bpm 73 bpm 67 bpm 66 bpm   Oxygen Saturation (Admit) 97 % 98 % 100 % 100 % 99 %   Oxygen Saturation (Exercise) 98 % 94 % 98 % 98 % 95 %   Oxygen Saturation (Exit) 97 % 99 % 98 % 99 % 97 %   Rating of Perceived Exertion (Exercise) '14 13 11 12 13   ' Perceived Dyspnea (Exercise) '2 2 1 1 2   ' Symptoms  -  -  -  - 1   Duration Progress to 45 minutes of aerobic exercise without signs/symptoms of physical distress Progress to 45 minutes of aerobic exercise without signs/symptoms of physical distress Progress to 45 minutes of aerobic exercise without signs/symptoms of physical distress Progress to 45 minutes of aerobic exercise without signs/symptoms of physical distress Progress to 45 minutes of aerobic exercise without signs/symptoms of physical distress   Intensity Other (comment)  40-80% of HRR THRR unchanged THRR unchanged THRR unchanged THRR unchanged     Progression   Progression Continue to progress workloads to maintain intensity without signs/symptoms of physical distress. Continue to progress workloads to maintain intensity without signs/symptoms of physical distress. Continue to progress workloads to maintain intensity without signs/symptoms of  physical distress. Continue to progress workloads to maintain intensity without signs/symptoms of physical distress. Continue to progress workloads to maintain intensity without signs/symptoms of physical distress.     Resistance Training   Training Prescription Yes Yes Yes Yes Yes   Weight orange bands orange bands orange bands orange bands orange bands   Reps 10-15 10-15 10-15 10-15 10-15   Time 10 Minutes 10  Minutes 10 Minutes 10 Minutes 10 Minutes     Interval Training   Interval Training Yes No No No No     Oxygen   Oxygen Continuous Continuous Continuous Continuous Continuous   Liters '3 3 3 3 3     ' NuStep   Level '2 2 2 2 3   ' Minutes '17 17 17 17 17   ' METs 1.5 1.6 1.8 1.3 1.7     Arm Ergometer   Level 1  -  - 2 3   Minutes 17  -  - 17 17     Track   Laps '6 10 9 8 11   ' Minutes '17 17 17 17 17   ' Row Name 07/20/17 1200             Response to Exercise   Blood Pressure (Admit) 134/80       Blood Pressure (Exercise) 130/70       Blood Pressure (Exit) 110/74       Heart Rate (Admit) 81 bpm       Heart Rate (Exercise) 92 bpm       Heart Rate (Exit) 72 bpm       Oxygen Saturation (Admit) 100 %       Oxygen Saturation (Exercise) 94 %       Oxygen Saturation (Exit) 98 %       Rating of Perceived Exertion (Exercise) 15       Perceived Dyspnea (Exercise) 3       Duration Progress to 45 minutes of aerobic exercise without signs/symptoms of physical distress       Intensity THRR unchanged         Progression   Progression Continue to progress workloads to maintain intensity without signs/symptoms of physical distress.         Resistance Training   Training Prescription Yes       Weight orange bands       Reps 10-15       Time 10 Minutes         Interval Training   Interval Training No         Oxygen   Oxygen Continuous       Liters 3         Arm Ergometer   Level 3       Minutes 17         Track   Laps 10       Minutes 17          Exercise  Comments:   Exercise Goals and Review:     Exercise Goals    Row Name 06/26/17 1103             Exercise Goals   Increase Physical Activity Yes       Intervention Provide advice, education, support and counseling about physical activity/exercise needs.;Develop an individualized exercise prescription for aerobic and resistive training based on initial evaluation findings, risk stratification, comorbidities and participant's personal goals.       Expected Outcomes Achievement of increased cardiorespiratory fitness and enhanced flexibility, muscular endurance and strength shown through measurements of functional capacity and personal statement of participant.       Increase Strength and Stamina Yes       Intervention Provide advice, education, support and counseling about physical activity/exercise needs.;Develop an individualized exercise prescription for aerobic and resistive training based on initial evaluation findings, risk stratification, comorbidities and participant's personal goals.  Expected Outcomes Achievement of increased cardiorespiratory fitness and enhanced flexibility, muscular endurance and strength shown through measurements of functional capacity and personal statement of participant.       Able to understand and use rate of perceived exertion (RPE) scale Yes       Intervention Provide education and explanation on how to use RPE scale       Expected Outcomes Short Term: Able to use RPE daily in rehab to express subjective intensity level;Long Term:  Able to use RPE to guide intensity level when exercising independently       Able to understand and use Dyspnea scale Yes       Intervention Provide education and explanation on how to use Dyspnea scale       Expected Outcomes Short Term: Able to use Dyspnea scale daily in rehab to express subjective sense of shortness of breath during exertion;Long Term: Able to use Dyspnea scale to guide intensity level when exercising  independently       Knowledge and understanding of Target Heart Rate Range (THRR) Yes       Intervention Provide education and explanation of THRR including how the numbers were predicted and where they are located for reference       Expected Outcomes Short Term: Able to state/look up THRR;Long Term: Able to use THRR to govern intensity when exercising independently;Short Term: Able to use daily as guideline for intensity in rehab       Understanding of Exercise Prescription Yes       Intervention Provide education, explanation, and written materials on patient's individual exercise prescription       Expected Outcomes Short Term: Able to explain program exercise prescription;Long Term: Able to explain home exercise prescription to exercise independently          Exercise Goals Re-Evaluation :     Exercise Goals Re-Evaluation    Row Name 07/24/17 0727             Exercise Goal Re-Evaluation   Exercise Goals Review Increase Strength and Stamina;Increase Physical Activity;Able to understand and use rate of perceived exertion (RPE) scale;Able to understand and use Dyspnea scale;Knowledge and understanding of Target Heart Rate Range (THRR);Understanding of Exercise Prescription       Comments Patient is slowly progressing. Shortness of breath really limits her. Has only attended 6 sessions. Will cont. to monitor and progress as able.        Expected Outcomes Through exercise at rehab and at home, patient will increase strength and stamina and decrease shortness of breath.           Discharge Exercise Prescription (Final Exercise Prescription Changes):     Exercise Prescription Changes - 07/20/17 1200      Response to Exercise   Blood Pressure (Admit) 134/80   Blood Pressure (Exercise) 130/70   Blood Pressure (Exit) 110/74   Heart Rate (Admit) 81 bpm   Heart Rate (Exercise) 92 bpm   Heart Rate (Exit) 72 bpm   Oxygen Saturation (Admit) 100 %   Oxygen Saturation (Exercise) 94 %    Oxygen Saturation (Exit) 98 %   Rating of Perceived Exertion (Exercise) 15   Perceived Dyspnea (Exercise) 3   Duration Progress to 45 minutes of aerobic exercise without signs/symptoms of physical distress   Intensity THRR unchanged     Progression   Progression Continue to progress workloads to maintain intensity without signs/symptoms of physical distress.     Resistance Training   Training Prescription Yes  Weight orange bands   Reps 10-15   Time 10 Minutes     Interval Training   Interval Training No     Oxygen   Oxygen Continuous   Liters 3     Arm Ergometer   Level 3   Minutes 17     Track   Laps 10   Minutes 17      Nutrition:  Target Goals: Understanding of nutrition guidelines, daily intake of sodium <1542m, cholesterol <2068m calories 30% from fat and 7% or less from saturated fats, daily to have 5 or more servings of fruits and vegetables.  Biometrics:     Pre Biometrics - 06/26/17 1118      Pre Biometrics   Grip Strength 18 kg       Nutrition Therapy Plan and Nutrition Goals:   Nutrition Discharge: Rate Your Plate Scores:     Nutrition Assessments - 07/13/17 1221      Rate Your Plate Scores   Pre Score 44      Nutrition Goals Re-Evaluation:   Nutrition Goals Discharge (Final Nutrition Goals Re-Evaluation):   Psychosocial: Target Goals: Acknowledge presence or absence of significant depression and/or stress, maximize coping skills, provide positive support system. Participant is able to verbalize types and ability to use techniques and skills needed for reducing stress and depression.  Initial Review & Psychosocial Screening:     Initial Psych Review & Screening - 06/26/17 1116      Initial Review   Current issues with None Identified     Family Dynamics   Good Support System? Yes     Barriers   Psychosocial barriers to participate in program There are no identifiable barriers or psychosocial needs.     Screening  Interventions   Interventions Encouraged to exercise      Quality of Life Scores:   PHQ-9: Recent Review Flowsheet Data    [facility] 06/26/2017 01/25/2016   Decreased Interest 0 0   Down, Depressed, Hopeless 0 0   PHQ - 2 Score 0 0   Altered sleeping 2 -   Tired, decreased energy 1 -   Change in appetite 0 -   Feeling bad or failure about yourself  0 -   Trouble concentrating 0 -   Moving slowly or fidgety/restless 0 -   Suicidal thoughts 0 -   PHQ-9 Score 3 -     Interpretation of Total Score  Total Score Depression Severity:  1-4 = Minimal depression, 5-9 = Mild depression, 10-14 = Moderate depression, 15-19 = Moderately severe depression, 20-27 = Severe depression   Psychosocial Evaluation and Intervention:     Psychosocial Evaluation - 06/26/17 1116      Psychosocial Evaluation & Interventions   Interventions Encouraged to exercise with the program and follow exercise prescription   Expected Outcomes patient will remain free from psychosocial barriers to particitpation   Continue Psychosocial Services  No Follow up required      Psychosocial Re-Evaluation:     Psychosocial Re-Evaluation    [place] 07/23/17 2151             Psychosocial Re-Evaluation   Current issues with None Identified       Expected Outcomes patient will remain free from psychosocial barriers to participation in pulmonary rehab       Interventions Encouraged to attend Pulmonary Rehabilitation for the exercise       Continue Psychosocial Services  No Follow up required  Psychosocial Discharge (Final Psychosocial Re-Evaluation):     Psychosocial Re-Evaluation - 07/23/17 2151      Psychosocial Re-Evaluation   Current issues with None Identified   Expected Outcomes patient will remain free from psychosocial barriers to participation in pulmonary rehab   Interventions Encouraged to attend Pulmonary Rehabilitation for the exercise   Continue Psychosocial  Services  No Follow up required      Education: Education Goals: Education classes will be provided on a weekly basis, covering required topics. Participant will state understanding/return demonstration of topics presented.  Learning Barriers/Preferences:     Learning Barriers/Preferences - 06/26/17 1115      Learning Barriers/Preferences   Learning Barriers None   Learning Preferences Computer/Internet;Individual Instruction;Verbal Instruction;Written Material      Education Topics: Risk Factor Reduction:  -Group instruction that is supported by a PowerPoint presentation. Instructor discusses the definition of a risk factor, different risk factors for pulmonary disease, and how the heart and lungs work together.     Nutrition for Pulmonary Patient:  -Group instruction provided by PowerPoint slides, verbal discussion, and written materials to support subject matter. The instructor gives an explanation and review of healthy diet recommendations, which includes a discussion on weight management, recommendations for fruit and vegetable consumption, as well as protein, fluid, caffeine, fiber, sodium, sugar, and alcohol. Tips for eating when patients are short of breath are discussed.   PULMONARY REHAB OTHER RESPIRATORY from 07/20/2017 in [place]  Date  07/20/17  Educator  edna  Instruction Review Code  2- meets goals/outcomes      Pursed Lip Breathing:  -Group instruction that is supported by demonstration and informational handouts. Instructor discusses the benefits of pursed lip and diaphragmatic breathing and detailed demonstration on how to preform both.     Oxygen Safety:  -Group instruction provided by PowerPoint, verbal discussion, and written material to support subject matter. There is an overview of "What is Oxygen" and "Why do we need it".  Instructor also reviews how to create a safe environment for oxygen use, the importance of using  oxygen as prescribed, and the risks of noncompliance. There is a brief discussion on traveling with oxygen and resources the patient may utilize.   Oxygen Equipment:  -Group instruction provided by [facility] Staff utilizing handouts, written materials, and equipment demonstrations.   Signs and Symptoms:  -Group instruction provided by written material and verbal discussion to support subject matter. Warning signs and symptoms of infection, stroke, and heart attack are reviewed and when to call the physician/911 reinforced. Tips for preventing the spread of infection discussed.   Advanced Directives:  -Group instruction provided by verbal instruction and written material to support subject matter. Instructor reviews Advanced Directive laws and proper instruction for filling out document.   Pulmonary Video:  -Group video education that reviews the importance of medication and oxygen compliance, exercise, good nutrition, pulmonary hygiene, and pursed lip and diaphragmatic breathing for the pulmonary patient.   Exercise for the Pulmonary Patient:  -Group instruction that is supported by a PowerPoint presentation. Instructor discusses benefits of exercise, core components of exercise, frequency, duration, and intensity of an exercise routine, importance of utilizing pulse oximetry during exercise, safety while exercising, and options of places to exercise outside of rehab.     Pulmonary Medications:  -Verbally interactive group education provided by instructor with focus on inhaled medications and proper administration.   Anatomy and Physiology of the Respiratory System and Intimacy:  -Group instruction provided by  PowerPoint, verbal discussion, and written material to support subject matter. Instructor reviews respiratory cycle and anatomical components of the respiratory system and their functions. Instructor also reviews differences in obstructive and restrictive respiratory diseases with  examples of each. Intimacy, Sex, and Sexuality differences are reviewed with a discussion on how relationships can change when diagnosed with pulmonary disease. Common sexual concerns are reviewed.   MD DAY -A group question and answer session with a medical doctor that allows participants to ask questions that relate to their pulmonary disease state.   PULMONARY REHAB OTHER RESPIRATORY from 07/20/2017 in [place]  Date  07/11/17  Educator  yacoub  Instruction Review Code  2- meets goals/outcomes      OTHER EDUCATION -Group or individual verbal, written, or video instructions that support the educational goals of the pulmonary rehab program.   Knowledge Questionnaire Score:     Knowledge Questionnaire Score - 06/28/17 1401      Knowledge Questionnaire Score   Pre Score 11/13      Core Components/Risk Factors/Patient Goals at Admission:     Personal Goals and Risk Factors at Admission - 06/26/17 1115      Core Components/Risk Factors/Patient Goals on Admission   Improve shortness of breath with ADL's Yes   Intervention Provide education, individualized exercise plan and daily activity instruction to help decrease symptoms of SOB with activities of daily living.   Expected Outcomes Short Term: Achieves a reduction of symptoms when performing activities of daily living.   Develop more efficient breathing techniques such as purse lipped breathing and diaphragmatic breathing; and practicing self-pacing with activity Yes   Intervention Provide education, demonstration and support about specific breathing techniuqes utilized for more efficient breathing. Include techniques such as pursed lipped breathing, diaphragmatic breathing and self-pacing activity.   Expected Outcomes Short Term: Participant will be able to demonstrate and use breathing techniques as needed throughout daily activities.      Core Components/Risk Factors/Patient Goals Review:       Goals and Risk Factor Review    Row Name 07/23/17 2149             Core Components/Risk Factors/Patient Goals Review   Personal Goals Review Develop more efficient breathing techniques such as purse lipped breathing and diaphragmatic breathing and practicing self-pacing with activity.;Improve shortness of breath with ADL's       Review patient has attended 6 session since admission. She is making slow and steady progress towards goals. she is observed utilizing PLB technique correctly without cueing. She states she is beginning to see slight improvements in her shortness of breath when she ambulates.       Expected Outcomes see admission outcomes          Core Components/Risk Factors/Patient Goals at Discharge (Final Review):      Goals and Risk Factor Review - 07/23/17 2149      Core Components/Risk Factors/Patient Goals Review   Personal Goals Review Develop more efficient breathing techniques such as purse lipped breathing and diaphragmatic breathing and practicing self-pacing with activity.;Improve shortness of breath with ADL's   Review patient has attended 6 session since admission. She is making slow and steady progress towards goals. she is observed utilizing PLB technique correctly without cueing. She states she is beginning to see slight improvements in her shortness of breath when she ambulates.   Expected Outcomes see admission outcomes      ITP Comments:   Comments: ITP REVIEW Pt is making expected  progress toward pulmonary rehab goals after completing 6 sessions. Recommend continued exercise, life style modification, education, and utilization of breathing techniques to increase stamina and strength and decrease shortness of breath with exertion.

## 2017-07-25 ENCOUNTER — Encounter (HOSPITAL_COMMUNITY)
Admission: RE | Admit: 2017-07-25 | Discharge: 2017-07-25 | Disposition: A | Discharge status: Home / Self Care | Payer: Medicare Other | Source: Ambulatory Visit | Attending: Internal Medicine | Admitting: Internal Medicine

## 2017-07-25 DIAGNOSIS — J9611 Chronic respiratory failure with hypoxia: Secondary | ICD-10-CM

## 2017-07-25 NOTE — Progress Notes (Signed)
Daily Session Note  Patient Details  Name: [name] MRN: [id number] Date of Birth: 02/04/1951 Referring Provider:     Pulmonary Rehab Walk Test from 06/27/2017 in [facility]  Referring Provider  Dr. Wert      Encounter Date: 07/25/2017  Check In:     Session Check In - 07/25/17 1030      Check-In   Location [facility]   Staff Present Joan Behrens, RN, BSN;Lisa Hughes, RN;Portia Payne, RN, BSN;Molly diVincenzo, MS, ACSM RCEP, Exercise Physiologist   Supervising physician immediately available to respond to emergencies [facility] immediately available   Physician(s) Dr. Ezenduka   Medication changes reported     No   Fall or balance concerns reported    No   Tobacco Cessation No Change   Warm-up and Cool-down Performed as group-led instruction   Resistance Training Performed Yes   VAD Patient? No     Pain Assessment   Currently in Pain? No/denies   Multiple Pain Sites No      Capillary Blood Glucose: No results found for this or any previous visit (from the past 24 hour(s)).      Exercise Prescription Changes - 07/25/17 1200      Response to Exercise   Blood Pressure (Admit) 124/80   Blood Pressure (Exercise) 154/86   Blood Pressure (Exit) 120/60   Heart Rate (Admit) 97 bpm   Heart Rate (Exercise) 121 bpm   Heart Rate (Exit) 103 bpm   Oxygen Saturation (Admit) 96 %   Oxygen Saturation (Exercise) 94 %   Oxygen Saturation (Exit) 98 %   Rating of Perceived Exertion (Exercise) 13   Perceived Dyspnea (Exercise) 1   Duration Progress to 45 minutes of aerobic exercise without signs/symptoms of physical distress   Intensity THRR unchanged     Progression   Progression Continue to progress workloads to maintain intensity without signs/symptoms of physical distress.     Resistance Training   Training Prescription Yes   Weight orange bands   Reps 10-15   Time 10 Minutes     Interval Training   Interval  Training No     Oxygen   Oxygen Continuous   Liters 3     NuStep   Level 3   Minutes 17   METs 1.6     Arm Ergometer   Level 3   Minutes 17     Track   Laps 10   Minutes 17      History  Smoking Status  . Former Smoker  . Packs/day: 0.50  . Years: 20.00  . Types: Cigarettes  . Quit date: 11/03/2008  Smokeless Tobacco  . Never Used    Goals Met:  Proper associated with RPD/PD & O2 Sat Strength training completed today  Goals Unmet:  Not Applicable  Comments: Service time is from 1030 to 1200    Dr. Wesam G. Yacoub is Medical Director for Pulmonary Rehab at [facility]. 

## 2017-07-27 ENCOUNTER — Encounter (HOSPITAL_COMMUNITY)
Admission: RE | Admit: 2017-07-27 | Discharge: 2017-07-27 | Disposition: A | Discharge status: Home / Self Care | Payer: Medicare Other | Source: Ambulatory Visit | Attending: Internal Medicine | Admitting: Internal Medicine

## 2017-07-27 VITALS — Wt 107.1 lb

## 2017-07-27 DIAGNOSIS — J9611 Chronic respiratory failure with hypoxia: Secondary | ICD-10-CM

## 2017-07-27 NOTE — Progress Notes (Signed)
Daily Session Note  Patient Details  Name: [name] MRN: [id number] Date of Birth: Jul 05, 1951 Referring Provider:     Pulmonary Rehab Walk Test from 06/27/2017 in [place]  Referring Provider  Dr. Melvyn Novas      Encounter Date: 07/27/2017  Check In:     Session Check In - 07/27/17 1102      Check-In   Location [facility]   Staff Present Trish Fountain, RN, BSN;Lisa Ysidro Evert, RN;Joan [place], RN, BSN;Chaundra Abreu, MS, ACSM RCEP, Exercise Physiologist   Supervising physician immediately available to respond to emergencies [facility] immediately available   Physician(s) Dr. Wendee Beavers   Medication changes reported     No   Fall or balance concerns reported    No   Tobacco Cessation No Change   Warm-up and Cool-down Performed as group-led instruction   Resistance Training Performed Yes   VAD Patient? No     Pain Assessment   Currently in Pain? No/denies   Multiple Pain Sites No      Capillary Blood Glucose: No results found for this or any previous visit (from the past 24 hour(s)).      Exercise Prescription Changes - 07/27/17 1200      Response to Exercise   Blood Pressure (Admit) 140/80   Blood Pressure (Exit) 120/64   Heart Rate (Admit) 75 bpm   Heart Rate (Exercise) 83 bpm   Heart Rate (Exit) 77 bpm   Oxygen Saturation (Admit) 98 %   Oxygen Saturation (Exercise) 95 %   Oxygen Saturation (Exit) 97 %   Rating of Perceived Exertion (Exercise) 9   Perceived Dyspnea (Exercise) 1   Duration Progress to 45 minutes of aerobic exercise without signs/symptoms of physical distress   Intensity THRR unchanged     Progression   Progression Continue to progress workloads to maintain intensity without signs/symptoms of physical distress.     Resistance Training   Training Prescription Yes   Weight orange bands   Reps 10-15   Time 10 Minutes     Interval Training   Interval Training No     Oxygen   Oxygen  Continuous   Liters 3     NuStep   Level 3   Minutes 17   METs 1.8      History  Smoking Status  . Former Smoker  . Packs/day: 0.50  . Years: 20.00  . Types: Cigarettes  . Quit date: 11/03/2008  Smokeless Tobacco  . Never Used    Goals Met:  Exercise tolerated well No report of cardiac concerns or symptoms Strength training completed today  Goals Unmet:  Not Applicable  Comments: Service time is from 10:30a to 12:30p    Dr. Rush Farmer is Medical Director for Pulmonary Rehab at [facility].

## 2017-08-01 ENCOUNTER — Encounter (HOSPITAL_COMMUNITY)
Admission: RE | Admit: 2017-08-01 | Discharge: 2017-08-01 | Disposition: A | Discharge status: Home / Self Care | Payer: Medicare Other | Source: Ambulatory Visit | Attending: Internal Medicine | Admitting: Internal Medicine

## 2017-08-01 VITALS — Wt 107.6 lb

## 2017-08-01 DIAGNOSIS — J9611 Chronic respiratory failure with hypoxia: Secondary | ICD-10-CM | POA: Diagnosis not present

## 2017-08-01 NOTE — Progress Notes (Signed)
Daily Session Note  Patient Details  Name: [name] MRN: [id number] Date of Birth: [birth year] Referring Provider:     Pulmonary Rehab Walk Test from 06/27/2017 in [place]  Referring Provider  Dr. Melvyn Novas      Encounter Date: 08/01/2017  Check In:     Session Check In - 08/01/17 1030      Check-In   Location [facility]   Staff Present Rosebud Poles, RN, BSN;Molly diVincenzo, MS, ACSM RCEP, Exercise Physiologist;Lisa Ysidro Evert, RN;Portia Rollene Rotunda, RN, BSN   Supervising physician immediately available to respond to emergencies [facility] immediately available   Physician(s) Dr. Wendee Beavers   Medication changes reported     No   Fall or balance concerns reported    No   Tobacco Cessation No Change   Warm-up and Cool-down Performed as group-led instruction   Resistance Training Performed Yes   VAD Patient? No     Pain Assessment   Currently in Pain? No/denies   Multiple Pain Sites No      Capillary Blood Glucose: No results found for this or any previous visit (from the past 24 hour(s)).      Exercise Prescription Changes - 08/01/17 1200      Response to Exercise   Blood Pressure (Admit) 134/72   Blood Pressure (Exercise) 150/70   Blood Pressure (Exit) 118/70   Heart Rate (Admit) 83 bpm   Heart Rate (Exercise) 94 bpm   Heart Rate (Exit) 81 bpm   Oxygen Saturation (Admit) 97 %   Oxygen Saturation (Exercise) 93 %   Oxygen Saturation (Exit) 100 %   Rating of Perceived Exertion (Exercise) 15   Perceived Dyspnea (Exercise) 2   Duration Progress to 45 minutes of aerobic exercise without signs/symptoms of physical distress   Intensity THRR unchanged     Progression   Progression Continue to progress workloads to maintain intensity without signs/symptoms of physical distress.     Resistance Training   Training Prescription Yes   Weight orange bands   Reps 10-15   Time 10 Minutes     Interval Training   Interval  Training No     Oxygen   Oxygen Continuous   Liters 3     NuStep   Level 3   Minutes 17     Arm Ergometer   Level 3   Minutes 17     Track   Laps 14   Minutes 17      History  Smoking Status  . Former Smoker  . Packs/day: 0.50  . Years: 20.00  . Types: Cigarettes  . Quit date: 11/03/2008  Smokeless Tobacco  . Never Used    Goals Met:  Proper associated with RPD/PD & O2 Sat Strength training completed today  Goals Unmet:  Not Applicable  Comments: Service time is from 53 to 1200    Dr. Rush Farmer is Medical Director for Pulmonary Rehab at [facility].

## 2017-08-03 ENCOUNTER — Encounter (HOSPITAL_COMMUNITY): Payer: Medicare Other

## 2017-08-08 ENCOUNTER — Encounter (HOSPITAL_COMMUNITY)
Admission: RE | Admit: 2017-08-08 | Discharge: 2017-08-08 | Disposition: A | Discharge status: Home / Self Care | Payer: Medicare Other | Source: Ambulatory Visit | Attending: Internal Medicine | Admitting: Internal Medicine

## 2017-08-08 VITALS — Wt 108.7 lb

## 2017-08-08 DIAGNOSIS — J9611 Chronic respiratory failure with hypoxia: Secondary | ICD-10-CM | POA: Insufficient documentation

## 2017-08-08 NOTE — Progress Notes (Signed)
Daily Session Note  Patient Details  Name: [name] MRN: [id number] Date of Birth: 05-07-1951 Referring Provider:     Pulmonary Rehab Walk Test from 06/27/2017 in [place]  Referring Provider  Dr. Melvyn Novas      Encounter Date: 08/08/2017  Check In: Session Check In - 08/08/17 1044      Check-In   Location  [facility]    Staff Present  Su Hilt, MS, ACSM RCEP, Exercise Physiologist;Lisa Ysidro Evert, RN;Standley Bargo Gregory, RN, Maxcine Ham, RN, BSN    Supervising physician immediately available to respond to emergencies  [facility] immediately available    Physician(s)  Dr. Broadus John    Medication changes reported      No    Fall or balance concerns reported     No    Tobacco Cessation  No Change    Warm-up and Cool-down  Performed as group-led instruction    Resistance Training Performed  Yes    VAD Patient?  No      Pain Assessment   Currently in Pain?  No/denies    Multiple Pain Sites  No       Capillary Blood Glucose: No results found for this or any previous visit (from the past 24 hour(s)).  Exercise Prescription Changes - 08/08/17 1224      Response to Exercise   Blood Pressure (Admit)  144/80    Blood Pressure (Exercise)  120/74    Blood Pressure (Exit)  126/70    Heart Rate (Admit)  74 bpm    Heart Rate (Exercise)  93 bpm    Heart Rate (Exit)  76 bpm    Oxygen Saturation (Admit)  99 %    Oxygen Saturation (Exercise)  94 %    Oxygen Saturation (Exit)  100 %    Rating of Perceived Exertion (Exercise)  11    Perceived Dyspnea (Exercise)  1    Duration  Progress to 45 minutes of aerobic exercise without signs/symptoms of physical distress    Intensity  THRR unchanged      Progression   Progression  Continue to progress workloads to maintain intensity without signs/symptoms of physical distress.      Resistance Training   Training Prescription  Yes    Weight  orange bands    Reps  10-15    Time  10  Minutes      Interval Training   Interval Training  No      Oxygen   Oxygen  Continuous    Liters  3      NuStep   Level  3    Minutes  17    METs  2      Arm Ergometer   Level  3    Minutes  17      Track   Laps  13    Minutes  17       Social History   Tobacco Use  Smoking Status Former Smoker  . Packs/day: 0.50  . Years: 20.00  . Pack years: 10.00  . Types: Cigarettes  . Last attempt to quit: 11/03/2008  . Years since quitting: 8.7  Smokeless Tobacco Never Used    Goals Met:  Independence with exercise equipment Improved SOB with ADL's Using PLB without cueing & demonstrates good technique Exercise tolerated well No report of cardiac concerns or symptoms Strength training completed today  Goals Unmet:  Not Applicable  Comments: Service time is from 1030 to  1200   Dr. Rush Farmer is Medical Director for Pulmonary Rehab at [facility]

## 2017-08-10 ENCOUNTER — Encounter (HOSPITAL_COMMUNITY)
Admission: RE | Admit: 2017-08-10 | Discharge: 2017-08-10 | Disposition: A | Discharge status: Home / Self Care | Payer: Medicare Other | Source: Ambulatory Visit | Attending: Internal Medicine | Admitting: Internal Medicine

## 2017-08-10 VITALS — Wt 108.7 lb

## 2017-08-10 DIAGNOSIS — J9611 Chronic respiratory failure with hypoxia: Secondary | ICD-10-CM

## 2017-08-10 NOTE — Progress Notes (Signed)
Daily Session Note  Patient Details  Name: Karlynn Furrow MRN: [id number] Date of Birth: [birth year] Referring Provider:     Pulmonary Rehab Walk Test from 06/27/2017 in [place]  Referring Provider  Dr. Melvyn Novas      Encounter Date: 08/10/2017  Check In: Session Check In - 08/10/17 1030      Check-In   Location  [facility]    Staff Present  Su Hilt, MS, ACSM RCEP, Exercise Physiologist;Lisa Ysidro Evert, RN;Marjorie Lussier Gordo, RN, Maxcine Ham, RN, BSN    Supervising physician immediately available to respond to emergencies  [facility] immediately available    Physician(s)  Dr. Quincy Simmonds    Medication changes reported      No    Fall or balance concerns reported     No    Tobacco Cessation  No Change    Warm-up and Cool-down  Performed as group-led instruction    Resistance Training Performed  Yes    VAD Patient?  No      Pain Assessment   Currently in Pain?  No/denies    Multiple Pain Sites  No       Capillary Blood Glucose: No results found for this or any previous visit (from the past 24 hour(s)).  Exercise Prescription Changes - 08/10/17 1334      Response to Exercise   Blood Pressure (Admit)  106/62    Blood Pressure (Exercise)  166/90    Blood Pressure (Exit)  122/80    Heart Rate (Admit)  72 bpm    Heart Rate (Exercise)  86 bpm    Heart Rate (Exit)  78 bpm    Oxygen Saturation (Admit)  96 %    Oxygen Saturation (Exercise)  93 %    Oxygen Saturation (Exit)  90 %    Rating of Perceived Exertion (Exercise)  9    Perceived Dyspnea (Exercise)  1    Duration  Progress to 45 minutes of aerobic exercise without signs/symptoms of physical distress    Intensity  THRR unchanged      Progression   Progression  Continue to progress workloads to maintain intensity without signs/symptoms of physical distress.      Resistance Training   Training Prescription  Yes    Weight  orange bands    Reps  10-15    Time  10  Minutes      Interval Training   Interval Training  No      Oxygen   Oxygen  Continuous    Liters  3      Track   Laps  12    Minutes  17       Social History   Tobacco Use  Smoking Status Former Smoker  . Packs/day: 0.50  . Years: 20.00  . Pack years: 10.00  . Types: Cigarettes  . Last attempt to quit: 11/03/2008  . Years since quitting: 8.7  Smokeless Tobacco Never Used    Goals Met:  Improved SOB with ADL's Using PLB without cueing & demonstrates good technique Exercise tolerated well No report of cardiac concerns or symptoms Strength training completed today  Goals Unmet:  Not Applicable  Comments: Service time is from 1030 to 1230   Dr. Rush Farmer is Medical Director for Pulmonary Rehab at [facility].

## 2017-08-15 ENCOUNTER — Encounter (HOSPITAL_COMMUNITY)
Admission: RE | Admit: 2017-08-15 | Discharge: 2017-08-15 | Disposition: A | Discharge status: Home / Self Care | Payer: Medicare Other | Source: Ambulatory Visit | Attending: Internal Medicine | Admitting: Internal Medicine

## 2017-08-15 VITALS — Wt 108.7 lb

## 2017-08-15 DIAGNOSIS — J9611 Chronic respiratory failure with hypoxia: Secondary | ICD-10-CM | POA: Diagnosis not present

## 2017-08-15 NOTE — Progress Notes (Signed)
Daily Session Note  Patient Details  Name: [name] MRN: [id number] Date of Birth: [birth year] Referring Provider:     Pulmonary Rehab Walk Test from 06/27/2017 in [place]  Referring Provider  Dr. Melvyn Novas      Encounter Date: 08/15/2017  Check In: Session Check In - 08/15/17 1239      Check-In   Location  [facility]    Staff Present  Dorna Bloom, MS, ACSM RCEP, Exercise Physiologist;Melyna Huron Ysidro Evert, RN;Molly diVincenzo, MS, ACSM RCEP, Exercise Physiologist;Joan Leonia Reeves, RN, BSN    Supervising physician immediately available to respond to emergencies  [facility] immediately available    Physician(s)  Dr. Maylene Roes    Medication changes reported      No    Fall or balance concerns reported     No    Tobacco Cessation  No Change    Warm-up and Cool-down  Performed as group-led instruction    Resistance Training Performed  Yes    VAD Patient?  No      Pain Assessment   Currently in Pain?  No/denies    Multiple Pain Sites  No       Capillary Blood Glucose: No results found for this or any previous visit (from the past 24 hour(s)).  Exercise Prescription Changes - 08/15/17 1200      Response to Exercise   Blood Pressure (Admit)  104/60    Blood Pressure (Exercise)  132/70    Blood Pressure (Exit)  120/72    Heart Rate (Admit)  79 bpm    Heart Rate (Exercise)  105 bpm    Heart Rate (Exit)  75 bpm    Oxygen Saturation (Admit)  99 %    Oxygen Saturation (Exercise)  92 %    Oxygen Saturation (Exit)  100 %    Rating of Perceived Exertion (Exercise)  11    Perceived Dyspnea (Exercise)  1    Duration  Progress to 45 minutes of aerobic exercise without signs/symptoms of physical distress    Intensity  THRR unchanged      Progression   Progression  Continue to progress workloads to maintain intensity without signs/symptoms of physical distress.      Resistance Training   Training Prescription  Yes    Weight  orange bands     Reps  10-15    Time  10 Minutes      Interval Training   Interval Training  No      Oxygen   Oxygen  Continuous    Liters  3      NuStep   Level  3    Minutes  17    METs  2      Arm Ergometer   Level  3    Minutes  17      Track   Laps  12    Minutes  17       Social History   Tobacco Use  Smoking Status Former Smoker  . Packs/day: 0.50  . Years: 20.00  . Pack years: 10.00  . Types: Cigarettes  . Last attempt to quit: 11/03/2008  . Years since quitting: 8.7  Smokeless Tobacco Never Used    Goals Met:  Exercise tolerated well No report of cardiac concerns or symptoms Strength training completed today  Goals Unmet:  Not Applicable    Comments: Service time is from 1030 to 1215    Dr. Rush Farmer is Medical Director  for Pulmonary Rehab at [facility].

## 2017-08-17 ENCOUNTER — Encounter (HOSPITAL_COMMUNITY)
Admission: RE | Admit: 2017-08-17 | Discharge: 2017-08-17 | Disposition: A | Discharge status: Home / Self Care | Payer: Medicare Other | Source: Ambulatory Visit | Attending: Internal Medicine | Admitting: Internal Medicine

## 2017-08-17 VITALS — Wt 107.6 lb

## 2017-08-17 DIAGNOSIS — J9611 Chronic respiratory failure with hypoxia: Secondary | ICD-10-CM | POA: Diagnosis not present

## 2017-08-17 NOTE — Progress Notes (Signed)
Pulmonary Individual Treatment Plan  Patient Details  Name: [name] MRN: [id number] Date of Birth: 1951-09-15 Referring Provider:     Pulmonary Rehab Walk Test from 06/27/2017 in [place]  Referring Provider  Dr. Melvyn Novas      Initial Encounter Date:    Pulmonary Rehab Walk Test from 06/27/2017 in [place]  Date  06/29/17  Referring Provider  Dr. Melvyn Novas      Visit Diagnosis: No diagnosis found.  Patient's Home Medications on Admission:   Current Outpatient Medications:  .  albuterol (PROVENTIL HFA;VENTOLIN HFA) 108 (90 BASE) MCG/ACT inhaler, Inhale 2 puffs into the lungs every 4 (four) hours as needed for wheezing or shortness of breath., Disp: 18 g, Rfl: 0 .  albuterol (PROVENTIL) (2.5 MG/3ML) 0.083% nebulizer solution, Take 3 mLs (2.5 mg total) by nebulization every 2 (two) hours as needed for wheezing or shortness of breath. (Patient not taking: Reported on 06/26/2017), Disp: 75 mL, Rfl: 0 .  alendronate (FOSAMAX) 70 MG tablet, TAKE 1 TABLET ONCE A WEEK, Disp: , Rfl: 11 .  aspirin EC 81 MG tablet, Take 81 mg by mouth daily., Disp: , Rfl:  .  atenolol (TENORMIN) 25 MG tablet, Take 25 mg by mouth daily., Disp: , Rfl:  .  Cyanocobalamin (VITAMIN B-12 PO), Take 1 tablet by mouth daily., Disp: , Rfl:  .  Glycopyrrolate-Formoterol (BEVESPI AEROSPHERE) 9-4.8 MCG/ACT AERO, Inhale 2 puffs into the lungs 2 (two) times daily., Disp: 2 Inhaler, Rfl: 0 .  guaiFENesin (MUCINEX) 600 MG 12 hr tablet, Take 1 tablet (600 mg total) by mouth 2 (two) times daily. (Patient not taking: Reported on 06/26/2017), Disp: 15 tablet, Rfl: 0 .  levothyroxine (SYNTHROID, LEVOTHROID) 75 MCG tablet, Take 75 mcg by mouth daily., Disp: , Rfl: 3 .  lovastatin (MEVACOR) 20 MG tablet, Take 20 mg by mouth at bedtime., Disp: , Rfl:  .  mirtazapine (REMERON) 15 MG tablet, Take 15 mg by mouth at bedtime as needed. sleep, Disp: , Rfl:  .  Multiple Vitamin  (MULTIVITAMIN WITH MINERALS) TABS tablet, Take 1 tablet by mouth daily., Disp: , Rfl:  .  OXYGEN, Pt uses 2lpm with sleep and 3 lpm with exertion  DME- [facility], Disp: , Rfl:  .  Tiotropium Bromide-Olodaterol (STIOLTO RESPIMAT) 2.5-2.5 MCG/ACT AERS, Inhale 2 puffs into the lungs daily., Disp: 2 Inhaler, Rfl: 0  Past Medical History: Past Medical History:  Diagnosis Date  . Acute on chronic respiratory failure ([place]) 01/17/2016  . Chronic bronchitis   . COPD (chronic obstructive pulmonary disease) ([place])   . Hyperlipidemia   . Hypertension   . Hypothyroidism   . Post-menopausal     Tobacco Use: Social History   Tobacco Use  Smoking Status Former Smoker  . Packs/day: 0.50  . Years: 20.00  . Pack years: 10.00  . Types: Cigarettes  . Last attempt to quit: 11/03/2008  . Years since quitting: 8.7  Smokeless Tobacco Never Used    Labs: Recent Review Flowsheet Data    Labs for ITP Cardiac and Pulmonary Rehab Latest Ref Rng & Units 10/22/2008 10/22/2008 01/17/2016   PHART 7.350 - 7.400 - 7.455(H) -   PCO2ART 35.0 - 45.0 mmHg - 38.0 -   HCO3 20.0 - 24.0 mEq/L - 26.3(H) 29.5(H)   TCO2 0 - 100 mmol/L 26 23.5 31   O2SAT % - 81.1 35.0      Capillary Blood Glucose: Lab Results  Component Value Date  GLUCAP 158 (H) 01/22/2016   GLUCAP 149 (H) 01/22/2016   GLUCAP 147 (H) 01/21/2016   GLUCAP 175 (H) 01/21/2016   GLUCAP 207 (H) 01/21/2016     Pulmonary Assessment Scores: Pulmonary Assessment Scores    Row Name 06/28/17 1403         ADL UCSD   ADL Phase  Entry     SOB Score total  36       CAT Score   CAT Score  16 Entry        Pulmonary Function Assessment: Pulmonary Function Assessment - 06/26/17 1115      Breath   Bilateral Breath Sounds  Clear    Shortness of Breath  Yes;Limiting activity;Panic with Shortness of Breath       Exercise Target Goals:    Exercise Program Goal: Individual exercise prescription set with THRR, safety & activity barriers. Participant  demonstrates ability to understand and report RPE using BORG scale, to self-measure pulse accurately, and to acknowledge the importance of the exercise prescription.  Exercise Prescription Goal: Starting with aerobic activity 30 plus minutes a day, 3 days per week for initial exercise prescription. Provide home exercise prescription and guidelines that participant acknowledges understanding prior to discharge.  Activity Barriers & Risk Stratification: Activity Barriers & Cardiac Risk Stratification - 06/26/17 1103      Activity Barriers & Cardiac Risk Stratification   Activity Barriers  Deconditioning;Shortness of Breath;Muscular Weakness       6 Minute Walk: 6 Minute Walk    Row Name 06/29/17 0709         6 Minute Walk   Phase  Initial     Distance  900 feet     Walk Time  6 minutes     # of Rest Breaks  0     MPH  1.7     METS  2.3     RPE  12     Perceived Dyspnea   1     Symptoms  Yes (comment)     Comments  used wheelchair     Resting HR  70 bpm     Resting BP  163/83     Resting Oxygen Saturation   95 %     Exercise Oxygen Saturation  during 6 min walk  88 %     Max Ex. HR  90 bpm     Max Ex. BP  177/72     2 Minute Post BP  167/71       Interval HR   1 Minute HR  80     2 Minute HR  84     3 Minute HR  84     4 Minute HR  88     5 Minute HR  90     6 Minute HR  90     2 Minute Post HR  73     Interval Heart Rate?  Yes       Interval Oxygen   Interval Oxygen?  Yes     Baseline Oxygen Saturation %  95 %     1 Minute Oxygen Saturation %  98 %     1 Minute Liters of Oxygen  3 L     2 Minute Oxygen Saturation %  95 %     2 Minute Liters of Oxygen  3 L     3 Minute Oxygen Saturation %  95 %     3 Minute Liters of Oxygen  3 L     4 Minute Oxygen Saturation %  99 %     4 Minute Liters of Oxygen  3 L     5 Minute Oxygen Saturation %  93 %     5 Minute Liters of Oxygen  3 L     6 Minute Oxygen Saturation %  88 %     6 Minute Liters of Oxygen  3 L     2  Minute Post Oxygen Saturation %  99 %     2 Minute Post Liters of Oxygen  3 L        Oxygen Initial Assessment: Oxygen Initial Assessment - 06/29/17 0716      Initial 6 min Walk   Oxygen Used  Continuous;E-Tanks    Liters per minute  3      Program Oxygen Prescription   Program Oxygen Prescription  Continuous;E-Tanks    Liters per minute  3       Oxygen Re-Evaluation: Oxygen Re-Evaluation    Row Name 07/23/17 2148 08/14/17 1535           Program Oxygen Prescription   Program Oxygen Prescription  Continuous;E-Tanks  Continuous;E-Tanks      Liters per minute  3  3        Home Oxygen   Home Oxygen Device  E-Tanks;Home Concentrator  E-Tanks;Home Concentrator      Sleep Oxygen Prescription  Continuous  Continuous      Liters per minute  2  2      Home Exercise Oxygen Prescription  Continuous  Continuous      Liters per minute  3  3      Home at Rest Exercise Oxygen Prescription  Continuous  Continuous      Liters per minute  2  2      Compliance with Home Oxygen Use  Yes  Yes        Goals/Expected Outcomes   Short Term Goals  To learn and exhibit compliance with exercise, home and travel O2 prescription;To learn and understand importance of monitoring SPO2 with pulse oximeter and demonstrate accurate use of the pulse oximeter.;To learn and demonstrate proper pursed lip breathing techniques or other breathing techniques.;To learn and understand importance of maintaining oxygen saturations>88%;To learn and demonstrate proper use of respiratory medications  To learn and exhibit compliance with exercise, home and travel O2 prescription;To learn and understand importance of monitoring SPO2 with pulse oximeter and demonstrate accurate use of the pulse oximeter.;To learn and demonstrate proper pursed lip breathing techniques or other breathing techniques.;To learn and understand importance of maintaining oxygen saturations>88%;To learn and demonstrate proper use of respiratory  medications      Long  Term Goals  Exhibits compliance with exercise, home and travel O2 prescription;Verbalizes importance of monitoring SPO2 with pulse oximeter and return demonstration;Exhibits proper breathing techniques, such as pursed lip breathing or other method taught during program session;Maintenance of O2 saturations>88%;Compliance with respiratory medication  Exhibits compliance with exercise, home and travel O2 prescription;Verbalizes importance of monitoring SPO2 with pulse oximeter and return demonstration;Exhibits proper breathing techniques, such as pursed lip breathing or other method taught during program session;Maintenance of O2 saturations>88%;Compliance with respiratory medication      Comments  patient verbalizes compliance with home o2 and demonstrates personal spo2 monitoring.  patient verbalizes compliance with home o2 and demonstrates personal spo2 monitoring.         Oxygen Discharge (Final Oxygen Re-Evaluation): Oxygen Re-Evaluation - 08/14/17 1535  Program Oxygen Prescription   Program Oxygen Prescription  Continuous;E-Tanks    Liters per minute  3      Home Oxygen   Home Oxygen Device  E-Tanks;Home Concentrator    Sleep Oxygen Prescription  Continuous    Liters per minute  2    Home Exercise Oxygen Prescription  Continuous    Liters per minute  3    Home at Rest Exercise Oxygen Prescription  Continuous    Liters per minute  2    Compliance with Home Oxygen Use  Yes      Goals/Expected Outcomes   Short Term Goals  To learn and exhibit compliance with exercise, home and travel O2 prescription;To learn and understand importance of monitoring SPO2 with pulse oximeter and demonstrate accurate use of the pulse oximeter.;To learn and demonstrate proper pursed lip breathing techniques or other breathing techniques.;To learn and understand importance of maintaining oxygen saturations>88%;To learn and demonstrate proper use of respiratory medications    Long   Term Goals  Exhibits compliance with exercise, home and travel O2 prescription;Verbalizes importance of monitoring SPO2 with pulse oximeter and return demonstration;Exhibits proper breathing techniques, such as pursed lip breathing or other method taught during program session;Maintenance of O2 saturations>88%;Compliance with respiratory medication    Comments  patient verbalizes compliance with home o2 and demonstrates personal spo2 monitoring.       Initial Exercise Prescription: Initial Exercise Prescription - 06/29/17 0700      Date of Initial Exercise RX and Referring Provider   Date  06/29/17    Referring Provider  Dr. Melvyn Novas      NuStep   Level  2    Minutes  17    METs  1.2      Arm Ergometer   Level  1    Minutes  17      Track   Laps  7    Minutes  17      Prescription Details   Frequency (times per week)  2    Duration  Progress to 45 minutes of aerobic exercise without signs/symptoms of physical distress      Intensity   THRR 40-80% of Max Heartrate  61-122    Ratings of Perceived Exertion  11-13    Perceived Dyspnea  0-4      Progression   Progression  Continue progressive overload as per policy without signs/symptoms or physical distress.      Resistance Training   Training Prescription  Yes    Weight  orange bands    Reps  10-15       Perform Capillary Blood Glucose checks as needed.  Exercise Prescription Changes: Exercise Prescription Changes    Row Name 07/04/17 1200 07/06/17 1300 07/11/17 1300 07/13/17 1200 07/18/17 1200     Response to Exercise   Blood Pressure (Admit)  134/90  134/74  102/64  154/80  122/68   Blood Pressure (Exercise)  140/80  140/78  140/76  120/70  122/80   Blood Pressure (Exit)  100/70  108/70  104/70  156/77  106/64   Heart Rate (Admit)  81 bpm  75 bpm  77 bpm  77 bpm  75 bpm   Heart Rate (Exercise)  86 bpm  81 bpm  82 bpm  85 bpm  92 bpm   Heart Rate (Exit)  68 bpm  64 bpm  73 bpm  67 bpm  66 bpm   Oxygen Saturation  (Admit)  97 %  98 %  100 %  100 %  99 %   Oxygen Saturation (Exercise)  98 %  94 %  98 %  98 %  95 %   Oxygen Saturation (Exit)  97 %  99 %  98 %  99 %  97 %   Rating of Perceived Exertion (Exercise)  '14  13  11  12  13   ' Perceived Dyspnea (Exercise)  '2  2  1  1  2   ' Symptoms  -  -  -  -  1   Duration  Progress to 45 minutes of aerobic exercise without signs/symptoms of physical distress  Progress to 45 minutes of aerobic exercise without signs/symptoms of physical distress  Progress to 45 minutes of aerobic exercise without signs/symptoms of physical distress  Progress to 45 minutes of aerobic exercise without signs/symptoms of physical distress  Progress to 45 minutes of aerobic exercise without signs/symptoms of physical distress   Intensity  Other (comment) 40-80% of HRR  THRR unchanged  THRR unchanged  THRR unchanged  THRR unchanged     Progression   Progression  Continue to progress workloads to maintain intensity without signs/symptoms of physical distress.  Continue to progress workloads to maintain intensity without signs/symptoms of physical distress.  Continue to progress workloads to maintain intensity without signs/symptoms of physical distress.  Continue to progress workloads to maintain intensity without signs/symptoms of physical distress.  Continue to progress workloads to maintain intensity without signs/symptoms of physical distress.     Resistance Training   Training Prescription  Yes  Yes  Yes  Yes  Yes   Weight  orange bands  orange bands  orange bands  orange bands  orange bands   Reps  10-15  10-15  10-15  10-15  10-15   Time  10 Minutes  10 Minutes  10 Minutes  10 Minutes  10 Minutes     Interval Training   Interval Training  Yes  No  No  No  No     Oxygen   Oxygen  Continuous  Continuous  Continuous  Continuous  Continuous   Liters  '3  3  3  3  3     ' NuStep   Level  '2  2  2  2  3   ' Minutes  '17  17  17  17  17   ' METs  1.5  1.6  1.8  1.3  1.7     Arm Ergometer    Level  1  -  -  2  3   Minutes  17  -  -  17  17     Track   Laps  '6  10  9  8  11   ' Minutes  '17  17  17  17  17   ' Row Name 07/20/17 1200 07/25/17 1200 07/27/17 1200 08/01/17 1200 08/08/17 1224     Response to Exercise   Blood Pressure (Admit)  134/80  124/80  140/80  134/72  144/80   Blood Pressure (Exercise)  130/70  154/86  -  150/70  120/74   Blood Pressure (Exit)  110/74  120/60  120/64  118/70  126/70   Heart Rate (Admit)  81 bpm  97 bpm  75 bpm  83 bpm  74 bpm   Heart Rate (Exercise)  92 bpm  121 bpm  83 bpm  94 bpm  93 bpm   Heart Rate (Exit)  72 bpm  103 bpm  77 bpm  81 bpm  76 bpm   Oxygen Saturation (Admit)  100 %  96 %  98 %  97 %  99 %   Oxygen Saturation (Exercise)  94 %  94 %  95 %  93 %  94 %   Oxygen Saturation (Exit)  98 %  98 %  97 %  100 %  100 %   Rating of Perceived Exertion (Exercise)  '15  13  9  15  11   ' Perceived Dyspnea (Exercise)  '3  1  1  2  1   ' Duration  Progress to 45 minutes of aerobic exercise without signs/symptoms of physical distress  Progress to 45 minutes of aerobic exercise without signs/symptoms of physical distress  Progress to 45 minutes of aerobic exercise without signs/symptoms of physical distress  Progress to 45 minutes of aerobic exercise without signs/symptoms of physical distress  Progress to 45 minutes of aerobic exercise without signs/symptoms of physical distress   Intensity  THRR unchanged  THRR unchanged  THRR unchanged  THRR unchanged  THRR unchanged     Progression   Progression  Continue to progress workloads to maintain intensity without signs/symptoms of physical distress.  Continue to progress workloads to maintain intensity without signs/symptoms of physical distress.  Continue to progress workloads to maintain intensity without signs/symptoms of physical distress.  Continue to progress workloads to maintain intensity without signs/symptoms of physical distress.  Continue to progress workloads to maintain intensity without  signs/symptoms of physical distress.     Resistance Training   Training Prescription  Yes  Yes  Yes  Yes  Yes   Weight  orange bands  orange bands  orange bands  orange bands  orange bands   Reps  10-15  10-15  10-15  10-15  10-15   Time  10 Minutes  10 Minutes  10 Minutes  10 Minutes  10 Minutes     Interval Training   Interval Training  No  No  No  No  No     Oxygen   Oxygen  Continuous  Continuous  Continuous  Continuous  Continuous   Liters  '3  3  3  3  3     ' NuStep   Level  -  '3  3  3  3   ' Minutes  -  '17  17  17  17   ' METs  -  1.6  1.8  -  2     Arm Ergometer   Level  3  3  -  3  3   Minutes  17  17  -  17  17     Track   Laps  10  10  -  [place] Name 08/10/17 1334 08/15/17 1200           Response to Exercise   Blood Pressure (Admit)  106/62  104/60      Blood Pressure (Exercise)  166/90  132/70      Blood Pressure (Exit)  122/80  120/72      Heart Rate (Admit)  72 bpm  79 bpm      Heart Rate (Exercise)  86 bpm  105 bpm      Heart Rate (Exit)  78 bpm  75 bpm      Oxygen Saturation (Admit)  96 %  99 %  Oxygen Saturation (Exercise)  93 %  92 %      Oxygen Saturation (Exit)  90 %  100 %      Rating of Perceived Exertion (Exercise)  9  11      Perceived Dyspnea (Exercise)  1  1      Duration  Progress to 45 minutes of aerobic exercise without signs/symptoms of physical distress  Progress to 45 minutes of aerobic exercise without signs/symptoms of physical distress      Intensity  THRR unchanged  THRR unchanged        Progression   Progression  Continue to progress workloads to maintain intensity without signs/symptoms of physical distress.  Continue to progress workloads to maintain intensity without signs/symptoms of physical distress.        Resistance Training   Training Prescription  Yes  Yes      Weight  orange bands  orange bands      Reps  10-15  10-15      Time  10 Minutes  10 Minutes        Interval Training    Interval Training  No  No        Oxygen   Oxygen  Continuous  Continuous      Liters  3  3        NuStep   Level  -  3      Minutes  -  17      METs  -  2        Arm Ergometer   Level  -  3      Minutes  -  17        Track   Laps  12  12      Minutes  17  17         Exercise Comments:   Exercise Goals and Review: Exercise Goals    Row Name 06/26/17 1103             Exercise Goals   Increase Physical Activity  Yes       Intervention  Provide advice, education, support and counseling about physical activity/exercise needs.;Develop an individualized exercise prescription for aerobic and resistive training based on initial evaluation findings, risk stratification, comorbidities and participant's personal goals.       Expected Outcomes  Achievement of increased cardiorespiratory fitness and enhanced flexibility, muscular endurance and strength shown through measurements of functional capacity and personal statement of participant.       Increase Strength and Stamina  Yes       Intervention  Provide advice, education, support and counseling about physical activity/exercise needs.;Develop an individualized exercise prescription for aerobic and resistive training based on initial evaluation findings, risk stratification, comorbidities and participant's personal goals.       Expected Outcomes  Achievement of increased cardiorespiratory fitness and enhanced flexibility, muscular endurance and strength shown through measurements of functional capacity and personal statement of participant.       Able to understand and use rate of perceived exertion (RPE) scale  Yes       Intervention  Provide education and explanation on how to use RPE scale       Expected Outcomes  Short Term: Able to use RPE daily in rehab to express subjective intensity level;Long Term:  Able to use RPE to guide intensity level when exercising independently       Able to understand and use Dyspnea scale  Yes  Intervention  Provide education and explanation on how to use Dyspnea scale       Expected Outcomes  Short Term: Able to use Dyspnea scale daily in rehab to express subjective sense of shortness of breath during exertion;Long Term: Able to use Dyspnea scale to guide intensity level when exercising independently       Knowledge and understanding of Target Heart Rate Range (THRR)  Yes       Intervention  Provide education and explanation of THRR including how the numbers were predicted and where they are located for reference       Expected Outcomes  Short Term: Able to state/look up THRR;Long Term: Able to use THRR to govern intensity when exercising independently;Short Term: Able to use daily as guideline for intensity in rehab       Understanding of Exercise Prescription  Yes       Intervention  Provide education, explanation, and written materials on patient's individual exercise prescription       Expected Outcomes  Short Term: Able to explain program exercise prescription;Long Term: Able to explain home exercise prescription to exercise independently          Exercise Goals Re-Evaluation : Exercise Goals Re-Evaluation    Row Name 07/24/17 0727 08/17/17 0959           Exercise Goal Re-Evaluation   Exercise Goals Review  Increase Strength and Stamina;Increase Physical Activity;Able to understand and use rate of perceived exertion (RPE) scale;Able to understand and use Dyspnea scale;Knowledge and understanding of Target Heart Rate Range (THRR);Understanding of Exercise Prescription  Increase Strength and Stamina;Increase Physical Activity;Able to understand and use Dyspnea scale;Able to understand and use rate of perceived exertion (RPE) scale;Knowledge and understanding of Target Heart Rate Range (THRR);Understanding of Exercise Prescription      Comments  Patient is slowly progressing. Shortness of breath really limits her. Has only attended 6 sessions. Will cont. to monitor and progress as  able.   Patient is slowly progressing. Shortness of breath really limits her. Has had a small set back due to a cold. Will discuss extending out the program so she can make more progress. Will cont. to monitor and progress as able.       Expected Outcomes  Through exercise at rehab and at home, patient will increase strength and stamina and decrease shortness of breath.   Through exercise at rehab and at home, patient will increase strength and stamina and also be able to perform ADL's easier.         Discharge Exercise Prescription (Final Exercise Prescription Changes): Exercise Prescription Changes - 08/15/17 1200      Response to Exercise   Blood Pressure (Admit)  104/60    Blood Pressure (Exercise)  132/70    Blood Pressure (Exit)  120/72    Heart Rate (Admit)  79 bpm    Heart Rate (Exercise)  105 bpm    Heart Rate (Exit)  75 bpm    Oxygen Saturation (Admit)  99 %    Oxygen Saturation (Exercise)  92 %    Oxygen Saturation (Exit)  100 %    Rating of Perceived Exertion (Exercise)  11    Perceived Dyspnea (Exercise)  1    Duration  Progress to 45 minutes of aerobic exercise without signs/symptoms of physical distress    Intensity  THRR unchanged      Progression   Progression  Continue to progress workloads to maintain intensity without signs/symptoms of physical distress.  Resistance Training   Training Prescription  Yes    Weight  orange bands    Reps  10-15    Time  10 Minutes      Interval Training   Interval Training  No      Oxygen   Oxygen  Continuous    Liters  3      NuStep   Level  3    Minutes  17    METs  2      Arm Ergometer   Level  3    Minutes  17      Track   Laps  12    Minutes  17       Nutrition:  Target Goals: Understanding of nutrition guidelines, daily intake of sodium <1559m, cholesterol <2043m calories 30% from fat and 7% or less from saturated fats, daily to have 5 or more servings of fruits and vegetables.  Biometrics: Pre  Biometrics - 06/26/17 1118      Pre Biometrics   Grip Strength  18 kg        Nutrition Therapy Plan and Nutrition Goals:   Nutrition Discharge: Rate Your Plate Scores: Nutrition Assessments - 07/13/17 1221      Rate Your Plate Scores   Pre Score  44       Nutrition Goals Re-Evaluation:   Nutrition Goals Discharge (Final Nutrition Goals Re-Evaluation):   Psychosocial: Target Goals: Acknowledge presence or absence of significant depression and/or stress, maximize coping skills, provide positive support system. Participant is able to verbalize types and ability to use techniques and skills needed for reducing stress and depression.  Initial Review & Psychosocial Screening: Initial Psych Review & Screening - 06/26/17 1116      Initial Review   Current issues with  None Identified      Family Dynamics   Good Support System?  Yes      Barriers   Psychosocial barriers to participate in program  There are no identifiable barriers or psychosocial needs.      Screening Interventions   Interventions  Encouraged to exercise       Quality of Life Scores:   PHQ-9: Recent Review Flowsheet Data    [facility] 01/25/2016   Decreased Interest 0 0   Down, Depressed, Hopeless 0 0   PHQ - 2 Score 0 0   Altered sleeping 2 -   Tired, decreased energy 1 -   Change in appetite 0 -   Feeling bad or failure about yourself  0 -   Trouble concentrating 0 -   Moving slowly or fidgety/restless 0 -   Suicidal thoughts 0 -   PHQ-9 Score 3 -     Interpretation of Total Score  Total Score Depression Severity:  1-4 = Minimal depression, 5-9 = Mild depression, 10-14 = Moderate depression, 15-19 = Moderately severe depression, 20-27 = Severe depression   Psychosocial Evaluation and Intervention: Psychosocial Evaluation - 06/26/17 1116      Psychosocial Evaluation & Interventions   Interventions  Encouraged to exercise with the program and follow exercise  prescription    Expected Outcomes  patient will remain free from psychosocial barriers to particitpation    Continue Psychosocial Services   No Follow up required       Psychosocial Re-Evaluation: Psychosocial Re-Evaluation    Row Name 07/23/17 2151 08/14/17 1538           Psychosocial Re-Evaluation   Current issues with  None Identified  None Identified      Expected Outcomes  patient will remain free from psychosocial barriers to participation in pulmonary rehab  patient will remain free from psychosocial barriers to participation in pulmonary rehab      Interventions  Encouraged to attend Pulmonary Rehabilitation for the exercise  Encouraged to attend Pulmonary Rehabilitation for the exercise      Continue Psychosocial Services   No Follow up required  No Follow up required         Psychosocial Discharge (Final Psychosocial Re-Evaluation): Psychosocial Re-Evaluation - 08/14/17 1538      Psychosocial Re-Evaluation   Current issues with  None Identified    Expected Outcomes  patient will remain free from psychosocial barriers to participation in pulmonary rehab    Interventions  Encouraged to attend Pulmonary Rehabilitation for the exercise    Continue Psychosocial Services   No Follow up required       Education: Education Goals: Education classes will be provided on a weekly basis, covering required topics. Participant will state understanding/return demonstration of topics presented.  Learning Barriers/Preferences: Learning Barriers/Preferences - 06/26/17 1115      Learning Barriers/Preferences   Learning Barriers  None    Learning Preferences  Computer/Internet;Individual Instruction;Verbal Instruction;Written Material       Education Topics: Risk Factor Reduction:  -Group instruction that is supported by a PowerPoint presentation. Instructor discusses the definition of a risk factor, different risk factors for pulmonary disease, and how the heart and lungs work  together.     Nutrition for Pulmonary Patient:  -Group instruction provided by PowerPoint slides, verbal discussion, and written materials to support subject matter. The instructor gives an explanation and review of healthy diet recommendations, which includes a discussion on weight management, recommendations for fruit and vegetable consumption, as well as protein, fluid, caffeine, fiber, sodium, sugar, and alcohol. Tips for eating when patients are short of breath are discussed.   PULMONARY REHAB OTHER RESPIRATORY from 08/10/2017 in [place]  Date  07/20/17  Educator  edna  Instruction Review Code  2- meets goals/outcomes      Pursed Lip Breathing:  -Group instruction that is supported by demonstration and informational handouts. Instructor discusses the benefits of pursed lip and diaphragmatic breathing and detailed demonstration on how to preform both.     Oxygen Safety:  -Group instruction provided by PowerPoint, verbal discussion, and written material to support subject matter. There is an overview of "What is Oxygen" and "Why do we need it".  Instructor also reviews how to create a safe environment for oxygen use, the importance of using oxygen as prescribed, and the risks of noncompliance. There is a brief discussion on traveling with oxygen and resources the patient may utilize.   PULMONARY REHAB OTHER RESPIRATORY from 08/10/2017 in [place]  Date  07/27/17  Educator  Trinady Milewski  Instruction Review Code  2- meets goals/outcomes      Oxygen Equipment:  -Group instruction provided by [employer] Staff utilizing handouts, written materials, and equipment demonstrations.   Signs and Symptoms:  -Group instruction provided by written material and verbal discussion to support subject matter. Warning signs and symptoms of infection, stroke, and heart attack are reviewed and when to call the physician/911 reinforced. Tips for  preventing the spread of infection discussed.   Advanced Directives:  -Group instruction provided by verbal instruction and written material to support subject matter. Instructor reviews Advanced Directive laws and proper instruction for filling out  document.   Pulmonary Video:  -Group video education that reviews the importance of medication and oxygen compliance, exercise, good nutrition, pulmonary hygiene, and pursed lip and diaphragmatic breathing for the pulmonary patient.   Exercise for the Pulmonary Patient:  -Group instruction that is supported by a PowerPoint presentation. Instructor discusses benefits of exercise, core components of exercise, frequency, duration, and intensity of an exercise routine, importance of utilizing pulse oximetry during exercise, safety while exercising, and options of places to exercise outside of rehab.     Pulmonary Medications:  -Verbally interactive group education provided by instructor with focus on inhaled medications and proper administration.   Anatomy and Physiology of the Respiratory System and Intimacy:  -Group instruction provided by PowerPoint, verbal discussion, and written material to support subject matter. Instructor reviews respiratory cycle and anatomical components of the respiratory system and their functions. Instructor also reviews differences in obstructive and restrictive respiratory diseases with examples of each. Intimacy, Sex, and Sexuality differences are reviewed with a discussion on how relationships can change when diagnosed with pulmonary disease. Common sexual concerns are reviewed.   MD DAY -A group question and answer session with a medical doctor that allows participants to ask questions that relate to their pulmonary disease state.   PULMONARY REHAB OTHER RESPIRATORY from 08/10/2017 in [place]  Date  07/11/17  Educator  yacoub  Instruction Review Code  2- meets goals/outcomes       OTHER EDUCATION -Group or individual verbal, written, or video instructions that support the educational goals of the pulmonary rehab program.   PULMONARY REHAB OTHER RESPIRATORY from 08/10/2017 in [place]  Date  08/10/17 [ZSWFUXN Eating]  Educator  RD  Instruction Review Code  1- Verbalizes Understanding      Knowledge Questionnaire Score: Knowledge Questionnaire Score - 06/28/17 1401      Knowledge Questionnaire Score   Pre Score  11/13       Core Components/Risk Factors/Patient Goals at Admission: Personal Goals and Risk Factors at Admission - 06/26/17 1115      Core Components/Risk Factors/Patient Goals on Admission   Improve shortness of breath with ADL's  Yes    Intervention  Provide education, individualized exercise plan and daily activity instruction to help decrease symptoms of SOB with activities of daily living.    Expected Outcomes  Short Term: Achieves a reduction of symptoms when performing activities of daily living.    Develop more efficient breathing techniques such as purse lipped breathing and diaphragmatic breathing; and practicing self-pacing with activity  Yes    Intervention  Provide education, demonstration and support about specific breathing techniuqes utilized for more efficient breathing. Include techniques such as pursed lipped breathing, diaphragmatic breathing and self-pacing activity.    Expected Outcomes  Short Term: Participant will be able to demonstrate and use breathing techniques as needed throughout daily activities.       Core Components/Risk Factors/Patient Goals Review:  Goals and Risk Factor Review    Row Name 07/23/17 2149 08/14/17 1536           Core Components/Risk Factors/Patient Goals Review   Personal Goals Review  Develop more efficient breathing techniques such as purse lipped breathing and diaphragmatic breathing and practicing self-pacing with activity.;Improve shortness of breath with  ADL's  Develop more efficient breathing techniques such as purse lipped breathing and diaphragmatic breathing and practicing self-pacing with activity.;Improve shortness of breath with ADL's      Review  patient has  attended 6 session since admission. She is making slow and steady progress towards goals. she is observed utilizing PLB technique correctly without cueing. She states she is beginning to see slight improvements in her shortness of breath when she ambulates.  patient doing well in pulmonary rehab. She is tolerating workload increases and feel she is getting stronger. her stamina and strength is improving and her shortness of breath with ADLs is improving with the use of PLB.      Expected Outcomes  see admission outcomes  see admission outcomes         Core Components/Risk Factors/Patient Goals at Discharge (Final Review):  Goals and Risk Factor Review - 08/14/17 1536      Core Components/Risk Factors/Patient Goals Review   Personal Goals Review  Develop more efficient breathing techniques such as purse lipped breathing and diaphragmatic breathing and practicing self-pacing with activity.;Improve shortness of breath with ADL's    Review  patient doing well in pulmonary rehab. She is tolerating workload increases and feel she is getting stronger. her stamina and strength is improving and her shortness of breath with ADLs is improving with the use of PLB.    Expected Outcomes  see admission outcomes       ITP Comments:   Comments: ITP REVIEW Pt is making expected progress toward pulmonary rehab goals after completing 13 sessions. Recommend continued exercise, life style modification, education, and utilization of breathing techniques to increase stamina and strength and decrease shortness of breath with exertion.

## 2017-08-17 NOTE — Progress Notes (Signed)
Daily Session Note  Patient Details  Name: [name] MRN: [id number] Date of Birth: 04-Jan-1951 Referring Provider:     Pulmonary Rehab Walk Test from 06/27/2017 in [place]  Referring Provider  Dr. Melvyn Novas      Encounter Date: 08/17/2017  Check In: Session Check In - 08/17/17 1030      Check-In   Location  [facility]    Staff Present  Rosebud Poles, RN, BSN;Esten Dollar, MS, ACSM RCEP, Exercise Physiologist;Lisa Ysidro Evert, RN;Portia Rollene Rotunda, RN, BSN    Supervising physician immediately available to respond to emergencies  [facility] immediately available    Physician(s)  Dr. Broadus John    Medication changes reported      No    Fall or balance concerns reported     No    Tobacco Cessation  No Change    Warm-up and Cool-down  Performed as group-led instruction    Resistance Training Performed  Yes    VAD Patient?  No      Pain Assessment   Currently in Pain?  No/denies    Multiple Pain Sites  No       Capillary Blood Glucose: No results found for this or any previous visit (from the past 24 hour(s)).  Exercise Prescription Changes - 08/17/17 1200      Response to Exercise   Blood Pressure (Admit)  140/70    Blood Pressure (Exercise)  110/60    Blood Pressure (Exit)  140/70    Heart Rate (Admit)  69 bpm    Heart Rate (Exercise)  76 bpm    Heart Rate (Exit)  64 bpm    Oxygen Saturation (Admit)  100 %    Oxygen Saturation (Exercise)  97 %    Oxygen Saturation (Exit)  99 %    Rating of Perceived Exertion (Exercise)  11    Perceived Dyspnea (Exercise)  1    Duration  Progress to 45 minutes of aerobic exercise without signs/symptoms of physical distress    Intensity  THRR unchanged      Progression   Progression  Continue to progress workloads to maintain intensity without signs/symptoms of physical distress.      Resistance Training   Training Prescription  Yes    Weight  orange bands    Reps  10-15    Time  10  Minutes      Interval Training   Interval Training  No      Oxygen   Oxygen  Continuous    Liters  3      NuStep   Level  3    Minutes  17    METs  2       Social History   Tobacco Use  Smoking Status Former Smoker  . Packs/day: 0.50  . Years: 20.00  . Pack years: 10.00  . Types: Cigarettes  . Last attempt to quit: 11/03/2008  . Years since quitting: 8.7  Smokeless Tobacco Never Used    Goals Met:  Exercise tolerated well No report of cardiac concerns or symptoms Strength training completed today  Goals Unmet:  Not Applicable  Comments: Service time is from 10:30a to 12:30p    Dr. Rush Farmer is Medical Director for Pulmonary Rehab at [facility].

## 2017-08-17 NOTE — Progress Notes (Signed)
I have reviewed a Home Exercise Prescription with [name] . [name] is not currently exercising at home.  The patient was advised to walk 2-3 days a week for 30 minutes.  [name] and I discussed how to progress their exercise prescription.  The patient stated that their goals were to be able to travel to see family and do more around the house.  The patient stated that they understand the exercise prescription.  We reviewed exercise guidelines, target heart rate during exercise, oxygen use, weather, home pulse oximeter, endpoints for exercise, and goals.  Patient is encouraged to come to me with any questions. I will continue to follow up with the patient to assist them with progression and safety.

## 2017-08-18 ENCOUNTER — Telehealth: Payer: Self-pay | Admitting: Internal Medicine

## 2017-08-18 NOTE — Telephone Encounter (Signed)
Called pulmonary rehab, Cloyde Reams has left for the day. She will be back on Monday.   Will call back then.

## 2017-08-21 NOTE — Telephone Encounter (Signed)
MW can we place an order for a POC? Then I will call Cloyde Reams to let her know.

## 2017-08-21 NOTE — Telephone Encounter (Signed)
ambulatory 02 titration to see if eligible for POC

## 2017-08-21 NOTE — Telephone Encounter (Signed)
Patient returning call - I was not sure if the test [name] was referring to was a 6 minute walk and I was not able to get anyone in triage, so I did not schedule appointment. Advised pt nurse will call her back - she can be reached at [phone]

## 2017-08-21 NOTE — Telephone Encounter (Signed)
Called and spoke with pt and she is aware of test that is needed and she has been scheduled for this.  Nothing further is needed.

## 2017-08-21 NOTE — Telephone Encounter (Signed)
I have called Molly and she is aware of MW recs.  I have called the pt and [id number] x 1 for her to get scheduled for the ambulatory 02 titration study to see if she is eligible for the POC.

## 2017-08-22 ENCOUNTER — Encounter (HOSPITAL_COMMUNITY)
Admission: RE | Admit: 2017-08-22 | Discharge: 2017-08-22 | Disposition: A | Discharge status: Home / Self Care | Payer: Medicare Other | Source: Ambulatory Visit | Attending: Internal Medicine | Admitting: Internal Medicine

## 2017-08-22 VITALS — Wt 107.6 lb

## 2017-08-22 DIAGNOSIS — J9611 Chronic respiratory failure with hypoxia: Secondary | ICD-10-CM | POA: Diagnosis not present

## 2017-08-22 NOTE — Progress Notes (Signed)
Daily Session Note  Patient Details  Name: [name] MRN: [id number] Date of Birth: 01-21-1951 Referring Provider:     Pulmonary Rehab Walk Test from 06/27/2017 in [place]  Referring Provider  Dr. Melvyn Novas      Encounter Date: 08/22/2017  Check In: Session Check In - 08/22/17 1047      Check-In   Location  [facility]    Staff Present  Su Hilt, MS, ACSM RCEP, Exercise Physiologist;Revecca Nachtigal Leonia Reeves, RN, BSN;Lisa Hughes, RN;Portia Rollene Rotunda, RN, BSN    Supervising physician immediately available to respond to emergencies  [facility] immediately available    Physician(s)  Dr. Maylene Roes    Medication changes reported      No    Fall or balance concerns reported     No    Tobacco Cessation  No Change    Warm-up and Cool-down  Performed as group-led instruction    Resistance Training Performed  Yes    VAD Patient?  No      Pain Assessment   Currently in Pain?  No/denies    Multiple Pain Sites  No       Capillary Blood Glucose: No results found for this or any previous visit (from the past 24 hour(s)).  Exercise Prescription Changes - 08/22/17 1200      Response to Exercise   Blood Pressure (Admit)  108/64    Blood Pressure (Exercise)  106/70    Blood Pressure (Exit)  128/66    Heart Rate (Admit)  94 bpm    Heart Rate (Exercise)  118 bpm    Heart Rate (Exit)  101 bpm    Oxygen Saturation (Admit)  100 %    Oxygen Saturation (Exercise)  98 %    Oxygen Saturation (Exit)  91 %    Rating of Perceived Exertion (Exercise)  11    Perceived Dyspnea (Exercise)  1    Duration  Progress to 45 minutes of aerobic exercise without signs/symptoms of physical distress    Intensity  THRR unchanged      Progression   Progression  Continue to progress workloads to maintain intensity without signs/symptoms of physical distress.      Resistance Training   Training Prescription  Yes    Weight  orange bands    Reps  10-15    Time  10  Minutes      Interval Training   Interval Training  No      Oxygen   Oxygen  Continuous    Liters  3      NuStep   Level  3    Minutes  17    METs  1.9      Arm Ergometer   Level  4    Minutes  17      Track   Laps  13    Minutes  17       Social History   Tobacco Use  Smoking Status Former Smoker  . Packs/day: 0.50  . Years: 20.00  . Pack years: 10.00  . Types: Cigarettes  . Last attempt to quit: 11/03/2008  . Years since quitting: 8.8  Smokeless Tobacco Never Used    Goals Met:  Exercise tolerated well Strength training completed today  Goals Unmet:  Not Applicable  Comments: Service time is from 1030 to 1210    Dr. Rush Farmer is Medical Director for Pulmonary Rehab at [facility].

## 2017-08-29 ENCOUNTER — Encounter (HOSPITAL_COMMUNITY)
Admission: RE | Admit: 2017-08-29 | Discharge: 2017-08-29 | Disposition: A | Discharge status: Home / Self Care | Payer: Medicare Other | Source: Ambulatory Visit | Attending: Internal Medicine | Admitting: Internal Medicine

## 2017-08-29 VITALS — Wt 108.9 lb

## 2017-08-29 DIAGNOSIS — J9611 Chronic respiratory failure with hypoxia: Secondary | ICD-10-CM | POA: Diagnosis not present

## 2017-08-29 NOTE — Progress Notes (Signed)
Daily Session Note  Patient Details  Name: [name] MRN: [id number] Date of Birth: October 06, 1950 Referring Provider:     Pulmonary Rehab Walk Test from 06/27/2017 in [place]  Referring Provider  Dr. Melvyn Novas      Encounter Date: 08/29/2017  Check In: Session Check In - 08/29/17 1030      Check-In   Location  [facility]    Staff Present  Rosebud Poles, RN, BSN;Molly diVincenzo, MS, ACSM RCEP, Exercise Physiologist;Lisa Ysidro Evert, RN;Portia Rollene Rotunda, RN, BSN    Supervising physician immediately available to respond to emergencies  [facility] immediately available    Physician(s)  Dr. Maylene Roes    Medication changes reported      No    Fall or balance concerns reported     No    Tobacco Cessation  No Change    Warm-up and Cool-down  Performed as group-led instruction    Resistance Training Performed  Yes    VAD Patient?  No      Pain Assessment   Currently in Pain?  No/denies    Multiple Pain Sites  No       Capillary Blood Glucose: No results found for this or any previous visit (from the past 24 hour(s)).  Exercise Prescription Changes - 08/29/17 1200      Response to Exercise   Blood Pressure (Admit)  132/80    Blood Pressure (Exercise)  120/74    Blood Pressure (Exit)  148/78    Heart Rate (Admit)  76 bpm    Heart Rate (Exercise)  91 bpm    Heart Rate (Exit)  70 bpm    Oxygen Saturation (Admit)  98 %    Oxygen Saturation (Exercise)  88 %    Oxygen Saturation (Exit)  100 %    Rating of Perceived Exertion (Exercise)  11    Perceived Dyspnea (Exercise)  1    Duration  Progress to 45 minutes of aerobic exercise without signs/symptoms of physical distress    Intensity  THRR unchanged      Progression   Progression  Continue to progress workloads to maintain intensity without signs/symptoms of physical distress.      Resistance Training   Training Prescription  Yes    Weight  orange bands    Reps  10-15    Time  10  Minutes      Interval Training   Interval Training  No      Oxygen   Oxygen  Continuous    Liters  3      NuStep   Level  5    Minutes  17    METs  2.1      Arm Ergometer   Level  4    Minutes  17      Track   Laps  13    Minutes  17       Social History   Tobacco Use  Smoking Status Former Smoker  . Packs/day: 0.50  . Years: 20.00  . Pack years: 10.00  . Types: Cigarettes  . Last attempt to quit: 11/03/2008  . Years since quitting: 8.8  Smokeless Tobacco Never Used    Goals Met:  Exercise tolerated well Strength training completed today  Goals Unmet:  Not Applicable  Comments: Service time is from 1030 to 1215    Dr. Rush Farmer is Medical Director for Pulmonary Rehab at [facility].

## 2017-08-30 ENCOUNTER — Telehealth: Payer: Self-pay | Admitting: Internal Medicine

## 2017-08-30 ENCOUNTER — Ambulatory Visit (INDEPENDENT_AMBULATORY_CARE_PROVIDER_SITE_OTHER): Payer: Medicare Other | Admitting: *Deleted

## 2017-08-30 DIAGNOSIS — J449 Chronic obstructive pulmonary disease, unspecified: Secondary | ICD-10-CM | POA: Diagnosis not present

## 2017-08-30 DIAGNOSIS — J9611 Chronic respiratory failure with hypoxia: Secondary | ICD-10-CM

## 2017-08-30 NOTE — Telephone Encounter (Signed)
Spoke with Corene Cornea and he states  The pt is in service with [facility] so I sent the order to them. Nothing further is needed.

## 2017-08-31 ENCOUNTER — Encounter (HOSPITAL_COMMUNITY)
Admission: RE | Admit: 2017-08-31 | Discharge: 2017-08-31 | Disposition: A | Discharge status: Home / Self Care | Payer: Medicare Other | Source: Ambulatory Visit | Attending: Internal Medicine | Admitting: Internal Medicine

## 2017-08-31 VITALS — Wt 109.6 lb

## 2017-08-31 DIAGNOSIS — J9611 Chronic respiratory failure with hypoxia: Secondary | ICD-10-CM

## 2017-08-31 NOTE — Progress Notes (Signed)
Daily Session Note  Patient Details  Name: [name] MRN: [id number] Date of Birth: 11-Apr-1951 Referring Provider:     Pulmonary Rehab Walk Test from 06/27/2017 in [place]  Referring Provider  Dr. Melvyn Novas      Encounter Date: 08/31/2017  Check In: Session Check In - 08/31/17 1111      Check-In   Location  [facility]    Staff Present  Su Hilt, MS, ACSM RCEP, Exercise Physiologist;Portia Rollene Rotunda, RN, BSN;Lisa Ysidro Evert, RN;Joan Leonia Reeves, RN, BSN    Supervising physician immediately available to respond to emergencies  [facility] immediately available    Physician(s)  Dr. Nevada Crane    Medication changes reported      No    Fall or balance concerns reported     No    Tobacco Cessation  No Change    Warm-up and Cool-down  Performed as group-led instruction    Resistance Training Performed  Yes    VAD Patient?  No      Pain Assessment   Currently in Pain?  No/denies    Multiple Pain Sites  No       Capillary Blood Glucose: No results found for this or any previous visit (from the past 24 hour(s)).  Exercise Prescription Changes - 08/31/17 1300      Response to Exercise   Blood Pressure (Admit)  130/70    Blood Pressure (Exercise)  150/80    Blood Pressure (Exit)  134/72    Heart Rate (Admit)  70 bpm    Heart Rate (Exercise)  89 bpm    Heart Rate (Exit)  72 bpm    Oxygen Saturation (Admit)  98 %    Oxygen Saturation (Exercise)  98 %    Oxygen Saturation (Exit)  100 %    Rating of Perceived Exertion (Exercise)  13    Perceived Dyspnea (Exercise)  2    Duration  Progress to 45 minutes of aerobic exercise without signs/symptoms of physical distress    Intensity  THRR unchanged      Progression   Progression  Continue to progress workloads to maintain intensity without signs/symptoms of physical distress.      Resistance Training   Training Prescription  Yes    Weight  orange bands    Reps  10-15    Time  10  Minutes      Interval Training   Interval Training  No      Oxygen   Oxygen  Continuous    Liters  3      NuStep   Level  5    Minutes  17    METs  1.8      Arm Ergometer   Level  4    Minutes  17       Social History   Tobacco Use  Smoking Status Former Smoker  . Packs/day: 0.50  . Years: 20.00  . Pack years: 10.00  . Types: Cigarettes  . Last attempt to quit: 11/03/2008  . Years since quitting: 8.8  Smokeless Tobacco Never Used    Goals Met:  Exercise tolerated well No report of cardiac concerns or symptoms Strength training completed today  Goals Unmet:  Not Applicable  Comments: Service time is from 10:30a to 12:35p    Dr. Rush Farmer is Medical Director for Pulmonary Rehab at [facility]

## 2017-09-04 ENCOUNTER — Ambulatory Visit: Payer: Medicare Other | Admitting: Internal Medicine

## 2017-09-05 ENCOUNTER — Encounter (HOSPITAL_COMMUNITY)
Admission: RE | Admit: 2017-09-05 | Discharge: 2017-09-05 | Disposition: A | Discharge status: Home / Self Care | Payer: Medicare Other | Source: Ambulatory Visit | Attending: Internal Medicine | Admitting: Internal Medicine

## 2017-09-05 VITALS — Wt 108.0 lb

## 2017-09-05 DIAGNOSIS — J9611 Chronic respiratory failure with hypoxia: Secondary | ICD-10-CM | POA: Diagnosis not present

## 2017-09-05 NOTE — Progress Notes (Signed)
Daily Session Note  Patient Details  Name: Christabell Loseke MRN: [id number] Date of Birth: May 21, 1951 Referring Provider:     Pulmonary Rehab Walk Test from 06/27/2017 in [place]  Referring Provider  Dr. Melvyn Novas      Encounter Date: 09/05/2017  Check In: Session Check In - 09/05/17 1214      Check-In   Location  [facility]    Staff Present  Su Hilt, MS, ACSM RCEP, Exercise Physiologist;Portia Rollene Rotunda, RN, BSN;Lisa Ysidro Evert, RN;Gardner Servantes Leonia Reeves, RN, BSN    Supervising physician immediately available to respond to emergencies  [facility] immediately available    Physician(s)  Dr. Nevada Crane    Medication changes reported      No    Fall or balance concerns reported     No    Tobacco Cessation  No Change    Warm-up and Cool-down  Performed as group-led instruction    Resistance Training Performed  Yes    VAD Patient?  No      Pain Assessment   Currently in Pain?  No/denies    Multiple Pain Sites  No       Capillary Blood Glucose: No results found for this or any previous visit (from the past 24 hour(s)).  Exercise Prescription Changes - 09/05/17 1200      Response to Exercise   Blood Pressure (Admit)  104/60    Blood Pressure (Exercise)  120/80    Blood Pressure (Exit)  100/76    Heart Rate (Admit)  75 bpm    Heart Rate (Exercise)  91 bpm    Heart Rate (Exit)  71 bpm    Oxygen Saturation (Admit)  97 %    Oxygen Saturation (Exercise)  96 %    Oxygen Saturation (Exit)  97 %    Rating of Perceived Exertion (Exercise)  13    Perceived Dyspnea (Exercise)  1    Duration  Progress to 45 minutes of aerobic exercise without signs/symptoms of physical distress    Intensity  THRR unchanged      Progression   Progression  Continue to progress workloads to maintain intensity without signs/symptoms of physical distress.      Resistance Training   Training Prescription  Yes    Weight  orange bands    Reps  10-15    Time  10  Minutes      Interval Training   Interval Training  No      Oxygen   Oxygen  Continuous    Liters  3      NuStep   Level  5    Minutes  17    METs  2.2      Arm Ergometer   Level  4    Minutes  17      Track   Laps  12    Minutes  17       Social History   Tobacco Use  Smoking Status Former Smoker  . Packs/day: 0.50  . Years: 20.00  . Pack years: 10.00  . Types: Cigarettes  . Last attempt to quit: 11/03/2008  . Years since quitting: 8.8  Smokeless Tobacco Never Used    Goals Met:  Exercise tolerated well Strength training completed today  Goals Unmet:  Not Applicable  Comments: Service time is from 1030 to 1205    Dr. Rush Farmer is Medical Director for Pulmonary Rehab at [facility].

## 2017-09-07 ENCOUNTER — Encounter (HOSPITAL_COMMUNITY)
Admission: RE | Admit: 2017-09-07 | Discharge: 2017-09-07 | Disposition: A | Discharge status: Home / Self Care | Payer: Medicare Other | Source: Ambulatory Visit | Attending: Internal Medicine | Admitting: Internal Medicine

## 2017-09-07 VITALS — Wt 108.7 lb

## 2017-09-07 DIAGNOSIS — J9611 Chronic respiratory failure with hypoxia: Secondary | ICD-10-CM | POA: Diagnosis not present

## 2017-09-07 NOTE — Progress Notes (Signed)
Pulmonary Individual Treatment Plan  Patient Details  Name: [name] MRN: [id number] Date of Birth: 1951/07/05 Referring Provider:     Pulmonary Rehab Walk Test from 06/27/2017 in [place]  Referring Provider  Dr. Melvyn Novas      Initial Encounter Date:    Pulmonary Rehab Walk Test from 06/27/2017 in [place]  Date  06/29/17  Referring Provider  Dr. Melvyn Novas      Visit Diagnosis: Chronic respiratory failure with hypoxia ([place])  Patient's Home Medications on Admission:   Current Outpatient Medications:  .  albuterol (PROVENTIL HFA;VENTOLIN HFA) 108 (90 BASE) MCG/ACT inhaler, Inhale 2 puffs into the lungs every 4 (four) hours as needed for wheezing or shortness of breath., Disp: 18 g, Rfl: 0 .  albuterol (PROVENTIL) (2.5 MG/3ML) 0.083% nebulizer solution, Take 3 mLs (2.5 mg total) by nebulization every 2 (two) hours as needed for wheezing or shortness of breath. (Patient not taking: Reported on 06/26/2017), Disp: 75 mL, Rfl: 0 .  alendronate (FOSAMAX) 70 MG tablet, TAKE 1 TABLET ONCE A WEEK, Disp: , Rfl: 11 .  aspirin EC 81 MG tablet, Take 81 mg by mouth daily., Disp: , Rfl:  .  atenolol (TENORMIN) 25 MG tablet, Take 25 mg by mouth daily., Disp: , Rfl:  .  Cyanocobalamin (VITAMIN B-12 PO), Take 1 tablet by mouth daily., Disp: , Rfl:  .  Glycopyrrolate-Formoterol (BEVESPI AEROSPHERE) 9-4.8 MCG/ACT AERO, Inhale 2 puffs into the lungs 2 (two) times daily., Disp: 2 Inhaler, Rfl: 0 .  guaiFENesin (MUCINEX) 600 MG 12 hr tablet, Take 1 tablet (600 mg total) by mouth 2 (two) times daily. (Patient not taking: Reported on 06/26/2017), Disp: 15 tablet, Rfl: 0 .  levothyroxine (SYNTHROID, LEVOTHROID) 75 MCG tablet, Take 75 mcg by mouth daily., Disp: , Rfl: 3 .  lovastatin (MEVACOR) 20 MG tablet, Take 20 mg by mouth at bedtime., Disp: , Rfl:  .  mirtazapine (REMERON) 15 MG tablet, Take 15 mg by mouth at bedtime as needed. sleep, Disp: , Rfl:   .  Multiple Vitamin (MULTIVITAMIN WITH MINERALS) TABS tablet, Take 1 tablet by mouth daily., Disp: , Rfl:  .  OXYGEN, Pt uses 2lpm with sleep and 3 lpm with exertion  DME- [facility], Disp: , Rfl:  .  Tiotropium Bromide-Olodaterol (STIOLTO RESPIMAT) 2.5-2.5 MCG/ACT AERS, Inhale 2 puffs into the lungs daily., Disp: 2 Inhaler, Rfl: 0  Past Medical History: Past Medical History:  Diagnosis Date  . Acute on chronic respiratory failure ([place]) 01/17/2016  . Chronic bronchitis   . COPD (chronic obstructive pulmonary disease) ([place])   . Hyperlipidemia   . Hypertension   . Hypothyroidism   . Post-menopausal     Tobacco Use: Social History   Tobacco Use  Smoking Status Former Smoker  . Packs/day: 0.50  . Years: 20.00  . Pack years: 10.00  . Types: Cigarettes  . Last attempt to quit: 11/03/2008  . Years since quitting: 8.8  Smokeless Tobacco Never Used    Labs: Recent Review Flowsheet Data    Labs for ITP Cardiac and Pulmonary Rehab Latest Ref Rng & Units 10/22/2008 10/22/2008 01/17/2016   PHART 7.350 - 7.400 - 7.455(H) -   PCO2ART 35.0 - 45.0 mmHg - 38.0 -   HCO3 20.0 - 24.0 mEq/L - 26.3(H) 29.5(H)   TCO2 0 - 100 mmol/L 26 23.5 31   O2SAT % - 81.1 35.0      Capillary Blood Glucose: Lab Results  Component Value  Date   GLUCAP 158 (H) 01/22/2016   GLUCAP 149 (H) 01/22/2016   GLUCAP 147 (H) 01/21/2016   GLUCAP 175 (H) 01/21/2016   GLUCAP 207 (H) 01/21/2016     Pulmonary Assessment Scores:   Pulmonary Function Assessment: Pulmonary Function Assessment - 06/26/17 1115      Breath   Bilateral Breath Sounds  Clear    Shortness of Breath  Yes;Limiting activity;Panic with Shortness of Breath       Exercise Target Goals:    Exercise Program Goal: Individual exercise prescription set with THRR, safety & activity barriers. Participant demonstrates ability to understand and report RPE using BORG scale, to self-measure pulse accurately, and to acknowledge the importance of the exercise  prescription.  Exercise Prescription Goal: Starting with aerobic activity 30 plus minutes a day, 3 days per week for initial exercise prescription. Provide home exercise prescription and guidelines that participant acknowledges understanding prior to discharge.  Activity Barriers & Risk Stratification: Activity Barriers & Cardiac Risk Stratification - 06/26/17 1103      Activity Barriers & Cardiac Risk Stratification   Activity Barriers  Deconditioning;Shortness of Breath;Muscular Weakness       6 Minute Walk: 6 Minute Walk    Row Name 06/29/17 0709         6 Minute Walk   Phase  Initial     Distance  900 feet     Walk Time  6 minutes     # of Rest Breaks  0     MPH  1.7     METS  2.3     RPE  12     Perceived Dyspnea   1     Symptoms  Yes (comment)     Comments  used wheelchair     Resting HR  70 bpm     Resting BP  163/83     Resting Oxygen Saturation   95 %     Exercise Oxygen Saturation  during 6 min walk  88 %     Max Ex. HR  90 bpm     Max Ex. BP  177/72     2 Minute Post BP  167/71       Interval HR   1 Minute HR  80     2 Minute HR  84     3 Minute HR  84     4 Minute HR  88     5 Minute HR  90     6 Minute HR  90     2 Minute Post HR  73     Interval Heart Rate?  Yes       Interval Oxygen   Interval Oxygen?  Yes     Baseline Oxygen Saturation %  95 %     1 Minute Oxygen Saturation %  98 %     1 Minute Liters of Oxygen  3 L     2 Minute Oxygen Saturation %  95 %     2 Minute Liters of Oxygen  3 L     3 Minute Oxygen Saturation %  95 %     3 Minute Liters of Oxygen  3 L     4 Minute Oxygen Saturation %  99 %     4 Minute Liters of Oxygen  3 L     5 Minute Oxygen Saturation %  93 %     5 Minute Liters of Oxygen  3 L  6 Minute Oxygen Saturation %  88 %     6 Minute Liters of Oxygen  3 L     2 Minute Post Oxygen Saturation %  99 %     2 Minute Post Liters of Oxygen  3 L        Oxygen Initial Assessment: Oxygen Initial Assessment - 06/29/17  0716      Initial 6 min Walk   Oxygen Used  Continuous;E-Tanks    Liters per minute  3      Program Oxygen Prescription   Program Oxygen Prescription  Continuous;E-Tanks    Liters per minute  3       Oxygen Re-Evaluation: Oxygen Re-Evaluation    Row Name 07/23/17 2148 08/14/17 1535 09/05/17 0743         Program Oxygen Prescription   Program Oxygen Prescription  Continuous;E-Tanks  Continuous;E-Tanks  Continuous;E-Tanks     Liters per minute  '3  3  3       ' Home Oxygen   Home Oxygen Device  E-Tanks;Home Concentrator  E-Tanks;Home Concentrator  E-Tanks;Home Concentrator     Sleep Oxygen Prescription  Continuous  Continuous  Continuous     Liters per minute  '2  2  2     ' Home Exercise Oxygen Prescription  Continuous  Continuous  Continuous     Liters per minute  '3  3  3     ' Home at Rest Exercise Oxygen Prescription  Continuous  Continuous  Continuous     Liters per minute  '2  2  2     ' Compliance with Home Oxygen Use  Yes  Yes  Yes       Goals/Expected Outcomes   Short Term Goals  To learn and exhibit compliance with exercise, home and travel O2 prescription;To learn and understand importance of monitoring SPO2 with pulse oximeter and demonstrate accurate use of the pulse oximeter.;To learn and demonstrate proper pursed lip breathing techniques or other breathing techniques.;To learn and understand importance of maintaining oxygen saturations>88%;To learn and demonstrate proper use of respiratory medications  To learn and exhibit compliance with exercise, home and travel O2 prescription;To learn and understand importance of monitoring SPO2 with pulse oximeter and demonstrate accurate use of the pulse oximeter.;To learn and demonstrate proper pursed lip breathing techniques or other breathing techniques.;To learn and understand importance of maintaining oxygen saturations>88%;To learn and demonstrate proper use of respiratory medications  To learn and exhibit compliance with exercise,  home and travel O2 prescription;To learn and understand importance of monitoring SPO2 with pulse oximeter and demonstrate accurate use of the pulse oximeter.;To learn and demonstrate proper pursed lip breathing techniques or other breathing techniques.;To learn and understand importance of maintaining oxygen saturations>88%;To learn and demonstrate proper use of respiratory medications     Long  Term Goals  Exhibits compliance with exercise, home and travel O2 prescription;Verbalizes importance of monitoring SPO2 with pulse oximeter and return demonstration;Exhibits proper breathing techniques, such as pursed lip breathing or other method taught during program session;Maintenance of O2 saturations>88%;Compliance with respiratory medication  Exhibits compliance with exercise, home and travel O2 prescription;Verbalizes importance of monitoring SPO2 with pulse oximeter and return demonstration;Exhibits proper breathing techniques, such as pursed lip breathing or other method taught during program session;Maintenance of O2 saturations>88%;Compliance with respiratory medication  Exhibits compliance with exercise, home and travel O2 prescription;Verbalizes importance of monitoring SPO2 with pulse oximeter and return demonstration;Exhibits proper breathing techniques, such as pursed lip breathing or other method taught during program session;Maintenance  of O2 saturations>88%;Compliance with respiratory medication     Comments  patient verbalizes compliance with home o2 and demonstrates personal spo2 monitoring.  patient verbalizes compliance with home o2 and demonstrates personal spo2 monitoring.  patient verbalizes compliance with home o2 and demonstrates personal spo2 monitoring.        Oxygen Discharge (Final Oxygen Re-Evaluation): Oxygen Re-Evaluation - 09/05/17 0743      Program Oxygen Prescription   Program Oxygen Prescription  Continuous;E-Tanks    Liters per minute  3      Home Oxygen   Home  Oxygen Device  E-Tanks;Home Concentrator    Sleep Oxygen Prescription  Continuous    Liters per minute  2    Home Exercise Oxygen Prescription  Continuous    Liters per minute  3    Home at Rest Exercise Oxygen Prescription  Continuous    Liters per minute  2    Compliance with Home Oxygen Use  Yes      Goals/Expected Outcomes   Short Term Goals  To learn and exhibit compliance with exercise, home and travel O2 prescription;To learn and understand importance of monitoring SPO2 with pulse oximeter and demonstrate accurate use of the pulse oximeter.;To learn and demonstrate proper pursed lip breathing techniques or other breathing techniques.;To learn and understand importance of maintaining oxygen saturations>88%;To learn and demonstrate proper use of respiratory medications    Long  Term Goals  Exhibits compliance with exercise, home and travel O2 prescription;Verbalizes importance of monitoring SPO2 with pulse oximeter and return demonstration;Exhibits proper breathing techniques, such as pursed lip breathing or other method taught during program session;Maintenance of O2 saturations>88%;Compliance with respiratory medication    Comments  patient verbalizes compliance with home o2 and demonstrates personal spo2 monitoring.       Initial Exercise Prescription: Initial Exercise Prescription - 06/29/17 0700      Date of Initial Exercise RX and Referring Provider   Date  06/29/17    Referring Provider  Dr. Melvyn Novas      NuStep   Level  2    Minutes  17    METs  1.2      Arm Ergometer   Level  1    Minutes  17      Track   Laps  7    Minutes  17      Prescription Details   Frequency (times per week)  2    Duration  Progress to 45 minutes of aerobic exercise without signs/symptoms of physical distress      Intensity   THRR 40-80% of Max Heartrate  61-122    Ratings of Perceived Exertion  11-13    Perceived Dyspnea  0-4      Progression   Progression  Continue progressive  overload as per policy without signs/symptoms or physical distress.      Resistance Training   Training Prescription  Yes    Weight  orange bands    Reps  10-15       Perform Capillary Blood Glucose checks as needed.  Exercise Prescription Changes: Exercise Prescription Changes    Row Name 07/04/17 1200 07/06/17 1300 07/11/17 1300 07/13/17 1200 07/18/17 1200     Response to Exercise   Blood Pressure (Admit)  134/90  134/74  102/64  154/80  122/68   Blood Pressure (Exercise)  140/80  140/78  140/76  120/70  122/80   Blood Pressure (Exit)  100/70  108/70  104/70  156/77  106/64   Heart  Rate (Admit)  81 bpm  75 bpm  77 bpm  77 bpm  75 bpm   Heart Rate (Exercise)  86 bpm  81 bpm  82 bpm  85 bpm  92 bpm   Heart Rate (Exit)  68 bpm  64 bpm  73 bpm  67 bpm  66 bpm   Oxygen Saturation (Admit)  97 %  98 %  100 %  100 %  99 %   Oxygen Saturation (Exercise)  98 %  94 %  98 %  98 %  95 %   Oxygen Saturation (Exit)  97 %  99 %  98 %  99 %  97 %   Rating of Perceived Exertion (Exercise)  '14  13  11  12  13   ' Perceived Dyspnea (Exercise)  '2  2  1  1  2   ' Symptoms  -  -  -  -  1   Duration  Progress to 45 minutes of aerobic exercise without signs/symptoms of physical distress  Progress to 45 minutes of aerobic exercise without signs/symptoms of physical distress  Progress to 45 minutes of aerobic exercise without signs/symptoms of physical distress  Progress to 45 minutes of aerobic exercise without signs/symptoms of physical distress  Progress to 45 minutes of aerobic exercise without signs/symptoms of physical distress   Intensity  Other (comment) 40-80% of HRR  THRR unchanged  THRR unchanged  THRR unchanged  THRR unchanged     Progression   Progression  Continue to progress workloads to maintain intensity without signs/symptoms of physical distress.  Continue to progress workloads to maintain intensity without signs/symptoms of physical distress.  Continue to progress workloads to maintain  intensity without signs/symptoms of physical distress.  Continue to progress workloads to maintain intensity without signs/symptoms of physical distress.  Continue to progress workloads to maintain intensity without signs/symptoms of physical distress.     Resistance Training   Training Prescription  Yes  Yes  Yes  Yes  Yes   Weight  orange bands  orange bands  orange bands  orange bands  orange bands   Reps  10-15  10-15  10-15  10-15  10-15   Time  10 Minutes  10 Minutes  10 Minutes  10 Minutes  10 Minutes     Interval Training   Interval Training  Yes  No  No  No  No     Oxygen   Oxygen  Continuous  Continuous  Continuous  Continuous  Continuous   Liters  '3  3  3  3  3     ' NuStep   Level  '2  2  2  2  3   ' Minutes  '17  17  17  17  17   ' METs  1.5  1.6  1.8  1.3  1.7     Arm Ergometer   Level  1  -  -  2  3   Minutes  17  -  -  17  17     Track   Laps  '6  10  9  8  11   ' Minutes  '17  17  17  17  17   ' Row Name 07/20/17 1200 07/25/17 1200 07/27/17 1200 08/01/17 1200 08/08/17 1224     Response to Exercise   Blood Pressure (Admit)  134/80  124/80  140/80  134/72  144/80   Blood Pressure (Exercise)  130/70  154/86  -  150/70  120/74   Blood Pressure (Exit)  110/74  120/60  120/64  118/70  126/70   Heart Rate (Admit)  81 bpm  97 bpm  75 bpm  83 bpm  74 bpm   Heart Rate (Exercise)  92 bpm  121 bpm  83 bpm  94 bpm  93 bpm   Heart Rate (Exit)  72 bpm  103 bpm  77 bpm  81 bpm  76 bpm   Oxygen Saturation (Admit)  100 %  96 %  98 %  97 %  99 %   Oxygen Saturation (Exercise)  94 %  94 %  95 %  93 %  94 %   Oxygen Saturation (Exit)  98 %  98 %  97 %  100 %  100 %   Rating of Perceived Exertion (Exercise)  '15  13  9  15  11   ' Perceived Dyspnea (Exercise)  '3  1  1  2  1   ' Duration  Progress to 45 minutes of aerobic exercise without signs/symptoms of physical distress  Progress to 45 minutes of aerobic exercise without signs/symptoms of physical distress  Progress to 45 minutes of aerobic  exercise without signs/symptoms of physical distress  Progress to 45 minutes of aerobic exercise without signs/symptoms of physical distress  Progress to 45 minutes of aerobic exercise without signs/symptoms of physical distress   Intensity  THRR unchanged  THRR unchanged  THRR unchanged  THRR unchanged  THRR unchanged     Progression   Progression  Continue to progress workloads to maintain intensity without signs/symptoms of physical distress.  Continue to progress workloads to maintain intensity without signs/symptoms of physical distress.  Continue to progress workloads to maintain intensity without signs/symptoms of physical distress.  Continue to progress workloads to maintain intensity without signs/symptoms of physical distress.  Continue to progress workloads to maintain intensity without signs/symptoms of physical distress.     Resistance Training   Training Prescription  Yes  Yes  Yes  Yes  Yes   Weight  orange bands  orange bands  orange bands  orange bands  orange bands   Reps  10-15  10-15  10-15  10-15  10-15   Time  10 Minutes  10 Minutes  10 Minutes  10 Minutes  10 Minutes     Interval Training   Interval Training  No  No  No  No  No     Oxygen   Oxygen  Continuous  Continuous  Continuous  Continuous  Continuous   Liters  '3  3  3  3  3     ' NuStep   Level  -  '3  3  3  3   ' Minutes  -  '17  17  17  17   ' METs  -  1.6  1.8  -  2     Arm Ergometer   Level  3  3  -  3  3   Minutes  17  17  -  17  17     Track   Laps  10  10  -  [place] Name 08/10/17 1334 08/15/17 1200 08/17/17 1200 08/22/17 1200 08/29/17 1200     Response to Exercise   Blood Pressure (Admit)  106/62  104/60  140/70  108/64  132/80   Blood Pressure (Exercise)  166/90  132/70  110/60  106/70  120/74   Blood Pressure (Exit)  122/80  120/72  140/70  128/66  148/78   Heart Rate (Admit)  72 bpm  79 bpm  69 bpm  94 bpm  76 bpm   Heart Rate (Exercise)  86 bpm  105 bpm  76 bpm   118 bpm  91 bpm   Heart Rate (Exit)  78 bpm  75 bpm  64 bpm  101 bpm  70 bpm   Oxygen Saturation (Admit)  96 %  99 %  100 %  100 %  98 %   Oxygen Saturation (Exercise)  93 %  92 %  97 %  98 %  88 %   Oxygen Saturation (Exit)  90 %  100 %  99 %  91 %  100 %   Rating of Perceived Exertion (Exercise)  '9  11  11  11  11   ' Perceived Dyspnea (Exercise)  '1  1  1  1  1   ' Duration  Progress to 45 minutes of aerobic exercise without signs/symptoms of physical distress  Progress to 45 minutes of aerobic exercise without signs/symptoms of physical distress  Progress to 45 minutes of aerobic exercise without signs/symptoms of physical distress  Progress to 45 minutes of aerobic exercise without signs/symptoms of physical distress  Progress to 45 minutes of aerobic exercise without signs/symptoms of physical distress   Intensity  THRR unchanged  THRR unchanged  THRR unchanged  THRR unchanged  THRR unchanged     Progression   Progression  Continue to progress workloads to maintain intensity without signs/symptoms of physical distress.  Continue to progress workloads to maintain intensity without signs/symptoms of physical distress.  Continue to progress workloads to maintain intensity without signs/symptoms of physical distress.  Continue to progress workloads to maintain intensity without signs/symptoms of physical distress.  Continue to progress workloads to maintain intensity without signs/symptoms of physical distress.     Resistance Training   Training Prescription  Yes  Yes  Yes  Yes  Yes   Weight  orange bands  orange bands  orange bands  orange bands  orange bands   Reps  10-15  10-15  10-15  10-15  10-15   Time  10 Minutes  10 Minutes  10 Minutes  10 Minutes  10 Minutes     Interval Training   Interval Training  No  No  No  No  No     Oxygen   Oxygen  Continuous  Continuous  Continuous  Continuous  Continuous   Liters  '3  3  3  3  3     ' NuStep   Level  -  '3  3  3  5   ' Minutes  -  '17  17  17  17    ' METs  -  2  2  1.9  2.1     Arm Ergometer   Level  -  3  -  4  4   Minutes  -  17  -  17  17     Track   Laps  12  12  -  13  13   Minutes  17  17  -  17  17     Home Exercise Plan   Plans to continue exercise at  -  -  [employer] (comment) [employer]  -  -   Frequency  -  -  Add 3 additional  days to program exercise sessions.  -  -   Row Name 08/31/17 1300 09/05/17 1200           Response to Exercise   Blood Pressure (Admit)  130/70  104/60      Blood Pressure (Exercise)  150/80  120/80      Blood Pressure (Exit)  134/72  100/76      Heart Rate (Admit)  70 bpm  75 bpm      Heart Rate (Exercise)  89 bpm  91 bpm      Heart Rate (Exit)  72 bpm  71 bpm      Oxygen Saturation (Admit)  98 %  97 %      Oxygen Saturation (Exercise)  98 %  96 %      Oxygen Saturation (Exit)  100 %  97 %      Rating of Perceived Exertion (Exercise)  13  13      Perceived Dyspnea (Exercise)  2  1      Duration  Progress to 45 minutes of aerobic exercise without signs/symptoms of physical distress  Progress to 45 minutes of aerobic exercise without signs/symptoms of physical distress      Intensity  THRR unchanged  THRR unchanged        Progression   Progression  Continue to progress workloads to maintain intensity without signs/symptoms of physical distress.  Continue to progress workloads to maintain intensity without signs/symptoms of physical distress.        Resistance Training   Training Prescription  Yes  Yes      Weight  orange bands  orange bands      Reps  10-15  10-15      Time  10 Minutes  10 Minutes        Interval Training   Interval Training  No  No        Oxygen   Oxygen  Continuous  Continuous      Liters  3  3        NuStep   Level  5  5      Minutes  17  17      METs  1.8  2.2        Arm Ergometer   Level  4  4      Minutes  17  17        Track   Laps  -  12      Minutes  -  17         Exercise Comments: Exercise Comments    Row Name 08/17/17 1308            Exercise Comments  Home exercise completed          Exercise Goals and Review: Exercise Goals    Row Name 06/26/17 1103             Exercise Goals   Increase Physical Activity  Yes       Intervention  Provide advice, education, support and counseling about physical activity/exercise needs.;Develop an individualized exercise prescription for aerobic and resistive training based on initial evaluation findings, risk stratification, comorbidities and participant's personal goals.       Expected Outcomes  Achievement of increased cardiorespiratory fitness and enhanced flexibility, muscular endurance and strength shown through measurements of functional capacity and personal statement of participant.       Increase Strength and Stamina  Yes  Intervention  Provide advice, education, support and counseling about physical activity/exercise needs.;Develop an individualized exercise prescription for aerobic and resistive training based on initial evaluation findings, risk stratification, comorbidities and participant's personal goals.       Expected Outcomes  Achievement of increased cardiorespiratory fitness and enhanced flexibility, muscular endurance and strength shown through measurements of functional capacity and personal statement of participant.       Able to understand and use rate of perceived exertion (RPE) scale  Yes       Intervention  Provide education and explanation on how to use RPE scale       Expected Outcomes  Short Term: Able to use RPE daily in rehab to express subjective intensity level;Long Term:  Able to use RPE to guide intensity level when exercising independently       Able to understand and use Dyspnea scale  Yes       Intervention  Provide education and explanation on how to use Dyspnea scale       Expected Outcomes  Short Term: Able to use Dyspnea scale daily in rehab to express subjective sense of shortness of breath during exertion;Long Term: Able to use  Dyspnea scale to guide intensity level when exercising independently       Knowledge and understanding of Target Heart Rate Range (THRR)  Yes       Intervention  Provide education and explanation of THRR including how the numbers were predicted and where they are located for reference       Expected Outcomes  Short Term: Able to state/look up THRR;Long Term: Able to use THRR to govern intensity when exercising independently;Short Term: Able to use daily as guideline for intensity in rehab       Understanding of Exercise Prescription  Yes       Intervention  Provide education, explanation, and written materials on patient's individual exercise prescription       Expected Outcomes  Short Term: Able to explain program exercise prescription;Long Term: Able to explain home exercise prescription to exercise independently          Exercise Goals Re-Evaluation : Exercise Goals Re-Evaluation    Row Name 07/24/17 0727 08/17/17 0959 09/05/17 0733         Exercise Goal Re-Evaluation   Exercise Goals Review  Increase Strength and Stamina;Increase Physical Activity;Able to understand and use rate of perceived exertion (RPE) scale;Able to understand and use Dyspnea scale;Knowledge and understanding of Target Heart Rate Range (THRR);Understanding of Exercise Prescription  Increase Strength and Stamina;Increase Physical Activity;Able to understand and use Dyspnea scale;Able to understand and use rate of perceived exertion (RPE) scale;Knowledge and understanding of Target Heart Rate Range (THRR);Understanding of Exercise Prescription  Increase Strength and Stamina;Increase Physical Activity;Able to understand and use Dyspnea scale;Able to understand and use rate of perceived exertion (RPE) scale;Knowledge and understanding of Target Heart Rate Range (THRR);Understanding of Exercise Prescription     Comments  Patient is slowly progressing. Shortness of breath really limits her. Has only attended 6 sessions. Will  cont. to monitor and progress as able.   Patient is slowly progressing. Shortness of breath really limits her. Has had a small set back due to a cold. Will discuss extending out the program so she can make more progress. Will cont. to monitor and progress as able.   Patient is progressing slow and steady in program. Patient is able to walk up to 13 laps in 15 minutes (22f each). Patient is being evaluated for  a POC, this will make it easier for her to preform ADL's with the oxygen. Will cont. to monitor and progress as able.      Expected Outcomes  Through exercise at rehab and at home, patient will increase strength and stamina and decrease shortness of breath.   Through exercise at rehab and at home, patient will increase strength and stamina and also be able to perform ADL's easier.  Through exercise at rehab and at home, patient will increase strength and stamina and understand how to safely exercise at home.         Discharge Exercise Prescription (Final Exercise Prescription Changes): Exercise Prescription Changes - 09/05/17 1200      Response to Exercise   Blood Pressure (Admit)  104/60    Blood Pressure (Exercise)  120/80    Blood Pressure (Exit)  100/76    Heart Rate (Admit)  75 bpm    Heart Rate (Exercise)  91 bpm    Heart Rate (Exit)  71 bpm    Oxygen Saturation (Admit)  97 %    Oxygen Saturation (Exercise)  96 %    Oxygen Saturation (Exit)  97 %    Rating of Perceived Exertion (Exercise)  13    Perceived Dyspnea (Exercise)  1    Duration  Progress to 45 minutes of aerobic exercise without signs/symptoms of physical distress    Intensity  THRR unchanged      Progression   Progression  Continue to progress workloads to maintain intensity without signs/symptoms of physical distress.      Resistance Training   Training Prescription  Yes    Weight  orange bands    Reps  10-15    Time  10 Minutes      Interval Training   Interval Training  No      Oxygen   Oxygen   Continuous    Liters  3      NuStep   Level  5    Minutes  17    METs  2.2      Arm Ergometer   Level  4    Minutes  17      Track   Laps  12    Minutes  17       Nutrition:  Target Goals: Understanding of nutrition guidelines, daily intake of sodium <1593m, cholesterol <2025m calories 30% from fat and 7% or less from saturated fats, daily to have 5 or more servings of fruits and vegetables.  Biometrics: Pre Biometrics - 06/26/17 1118      Pre Biometrics   Grip Strength  18 kg        Nutrition Therapy Plan and Nutrition Goals:   Nutrition Discharge: Rate Your Plate Scores: Nutrition Assessments - 07/13/17 1221      Rate Your Plate Scores   Pre Score  44       Nutrition Goals Re-Evaluation:   Nutrition Goals Discharge (Final Nutrition Goals Re-Evaluation):   Psychosocial: Target Goals: Acknowledge presence or absence of significant depression and/or stress, maximize coping skills, provide positive support system. Participant is able to verbalize types and ability to use techniques and skills needed for reducing stress and depression.  Initial Review & Psychosocial Screening: Initial Psych Review & Screening - 06/26/17 1116      Initial Review   Current issues with  None Identified      Family Dynamics   Good Support System?  Yes      Barriers  Psychosocial barriers to participate in program  There are no identifiable barriers or psychosocial needs.      Screening Interventions   Interventions  Encouraged to exercise       Quality of Life Scores:   PHQ-9: Recent Review Flowsheet Data    Depression screen [facility] 2/9 06/26/2017 01/25/2016   Decreased Interest 0 0   Down, Depressed, Hopeless 0 0   PHQ - 2 Score 0 0   Altered sleeping 2 -   Tired, decreased energy 1 -   Change in appetite 0 -   Feeling bad or failure about yourself  0 -   Trouble concentrating 0 -   Moving slowly or fidgety/restless 0 -   Suicidal thoughts 0 -   PHQ-9 Score 3  -     Interpretation of Total Score  Total Score Depression Severity:  1-4 = Minimal depression, 5-9 = Mild depression, 10-14 = Moderate depression, 15-19 = Moderately severe depression, 20-27 = Severe depression   Psychosocial Evaluation and Intervention: Psychosocial Evaluation - 06/26/17 1116      Psychosocial Evaluation & Interventions   Interventions  Encouraged to exercise with the program and follow exercise prescription    Expected Outcomes  patient will remain free from psychosocial barriers to particitpation    Continue Psychosocial Services   No Follow up required       Psychosocial Re-Evaluation: Psychosocial Re-Evaluation    [place] Name 07/23/17 2151 08/14/17 1538 09/05/17 0745         Psychosocial Re-Evaluation   Current issues with  None Identified  None Identified  None Identified     Expected Outcomes  patient will remain free from psychosocial barriers to participation in pulmonary rehab  patient will remain free from psychosocial barriers to participation in pulmonary rehab  patient will remain free from psychosocial barriers to participation in pulmonary rehab     Interventions  Encouraged to attend Pulmonary Rehabilitation for the exercise  Encouraged to attend Pulmonary Rehabilitation for the exercise  Encouraged to attend Pulmonary Rehabilitation for the exercise     Continue Psychosocial Services   No Follow up required  No Follow up required  No Follow up required        Psychosocial Discharge (Final Psychosocial Re-Evaluation): Psychosocial Re-Evaluation - 09/05/17 0745      Psychosocial Re-Evaluation   Current issues with  None Identified    Expected Outcomes  patient will remain free from psychosocial barriers to participation in pulmonary rehab    Interventions  Encouraged to attend Pulmonary Rehabilitation for the exercise    Continue Psychosocial Services   No Follow up required       Education: Education Goals: Education classes will be provided  on a weekly basis, covering required topics. Participant will state understanding/return demonstration of topics presented.  Learning Barriers/Preferences: Learning Barriers/Preferences - 06/26/17 1115      Learning Barriers/Preferences   Learning Barriers  None    Learning Preferences  Computer/Internet;Individual Instruction;Verbal Instruction;Written Material       Education Topics: Risk Factor Reduction:  -Group instruction that is supported by a PowerPoint presentation. Instructor discusses the definition of a risk factor, different risk factors for pulmonary disease, and how the heart and lungs work together.     Nutrition for Pulmonary Patient:  -Group instruction provided by PowerPoint slides, verbal discussion, and written materials to support subject matter. The instructor gives an explanation and review of healthy diet recommendations, which includes a discussion on weight management, recommendations for fruit and  vegetable consumption, as well as protein, fluid, caffeine, fiber, sodium, sugar, and alcohol. Tips for eating when patients are short of breath are discussed.   PULMONARY REHAB OTHER RESPIRATORY from 08/31/2017 in [place]  Date  07/20/17  Educator  edna  Instruction Review Code  2- meets goals/outcomes      Pursed Lip Breathing:  -Group instruction that is supported by demonstration and informational handouts. Instructor discusses the benefits of pursed lip and diaphragmatic breathing and detailed demonstration on how to preform both.     Oxygen Safety:  -Group instruction provided by PowerPoint, verbal discussion, and written material to support subject matter. There is an overview of "What is Oxygen" and "Why do we need it".  Instructor also reviews how to create a safe environment for oxygen use, the importance of using oxygen as prescribed, and the risks of noncompliance. There is a brief discussion on traveling with oxygen  and resources the patient may utilize.   PULMONARY REHAB OTHER RESPIRATORY from 08/31/2017 in [place]  Date  07/27/17  Educator  Kaegan Stigler  Instruction Review Code  2- meets goals/outcomes      Oxygen Equipment:  -Group instruction provided by [employer] Staff utilizing handouts, written materials, and equipment demonstrations.   PULMONARY REHAB OTHER RESPIRATORY from 08/31/2017 in [place]  Date  08/31/17  Educator  Ace Gins  Instruction Review Code  2- meets goals/outcomes      Signs and Symptoms:  -Group instruction provided by written material and verbal discussion to support subject matter. Warning signs and symptoms of infection, stroke, and heart attack are reviewed and when to call the physician/911 reinforced. Tips for preventing the spread of infection discussed.   Advanced Directives:  -Group instruction provided by verbal instruction and written material to support subject matter. Instructor reviews Advanced Directive laws and proper instruction for filling out document.   Pulmonary Video:  -Group video education that reviews the importance of medication and oxygen compliance, exercise, good nutrition, pulmonary hygiene, and pursed lip and diaphragmatic breathing for the pulmonary patient.   Exercise for the Pulmonary Patient:  -Group instruction that is supported by a PowerPoint presentation. Instructor discusses benefits of exercise, core components of exercise, frequency, duration, and intensity of an exercise routine, importance of utilizing pulse oximetry during exercise, safety while exercising, and options of places to exercise outside of rehab.     PULMONARY REHAB OTHER RESPIRATORY from 08/31/2017 in [place]  Date  08/17/17  Educator  Cloyde Reams  Instruction Review Code  2- meets goals/outcomes      Pulmonary Medications:  -Verbally interactive group education  provided by instructor with focus on inhaled medications and proper administration.   Anatomy and Physiology of the Respiratory System and Intimacy:  -Group instruction provided by PowerPoint, verbal discussion, and written material to support subject matter. Instructor reviews respiratory cycle and anatomical components of the respiratory system and their functions. Instructor also reviews differences in obstructive and restrictive respiratory diseases with examples of each. Intimacy, Sex, and Sexuality differences are reviewed with a discussion on how relationships can change when diagnosed with pulmonary disease. Common sexual concerns are reviewed.   MD DAY -A group question and answer session with a medical doctor that allows participants to ask questions that relate to their pulmonary disease state.   PULMONARY REHAB OTHER RESPIRATORY from 08/31/2017 in [place]  Date  07/11/17  Educator  yacoub  Instruction Review Code  2- meets goals/outcomes      OTHER EDUCATION -Group or individual verbal, written, or video instructions that support the educational goals of the pulmonary rehab program.   PULMONARY REHAB OTHER RESPIRATORY from 08/31/2017 in [place]  Date  08/10/17 [[facility]]  Educator  RD  Instruction Review Code  1- Verbalizes Understanding      Knowledge Questionnaire Score:   Core Components/Risk Factors/Patient Goals at Admission: Personal Goals and Risk Factors at Admission - 06/26/17 1115      Core Components/Risk Factors/Patient Goals on Admission   Improve shortness of breath with ADL's  Yes    Intervention  Provide education, individualized exercise plan and daily activity instruction to help decrease symptoms of SOB with activities of daily living.    Expected Outcomes  Short Term: Achieves a reduction of symptoms when performing activities of daily living.    Develop more efficient  breathing techniques such as purse lipped breathing and diaphragmatic breathing; and practicing self-pacing with activity  Yes    Intervention  Provide education, demonstration and support about specific breathing techniuqes utilized for more efficient breathing. Include techniques such as pursed lipped breathing, diaphragmatic breathing and self-pacing activity.    Expected Outcomes  Short Term: Participant will be able to demonstrate and use breathing techniques as needed throughout daily activities.       Core Components/Risk Factors/Patient Goals Review:  Goals and Risk Factor Review    Row Name 07/23/17 2149 08/14/17 1536 09/05/17 0744         Core Components/Risk Factors/Patient Goals Review   Personal Goals Review  Develop more efficient breathing techniques such as purse lipped breathing and diaphragmatic breathing and practicing self-pacing with activity.;Improve shortness of breath with ADL's  Develop more efficient breathing techniques such as purse lipped breathing and diaphragmatic breathing and practicing self-pacing with activity.;Improve shortness of breath with ADL's  Develop more efficient breathing techniques such as purse lipped breathing and diaphragmatic breathing and practicing self-pacing with activity.;Improve shortness of breath with ADL's     Review  patient has attended 6 session since admission. She is making slow and steady progress towards goals. she is observed utilizing PLB technique correctly without cueing. She states she is beginning to see slight improvements in her shortness of breath when she ambulates.  patient doing well in pulmonary rehab. She is tolerating workload increases and feel she is getting stronger. her stamina and strength is improving and her shortness of breath with ADLs is improving with the use of PLB.  patient continues to do well and work hard in pulmonary rehab. She is progressing nicely towards her goals and her attendance is 90%. She  consistantly utilizes PLB with exertion in class and at home which eases her shortness of breath with exertion. She is excited about being evaluated for a POC as she feels this will allow her to be more independent and mobil which will make her more compliant with her home exercise prescription.     Expected Outcomes  see admission outcomes  see admission outcomes  see admission outcomes        Core Components/Risk Factors/Patient Goals at Discharge (Final Review):  Goals and Risk Factor Review - 09/05/17 0744      Core Components/Risk Factors/Patient Goals Review   Personal Goals Review  Develop more efficient breathing techniques such as purse lipped breathing and diaphragmatic breathing and practicing self-pacing with activity.;Improve shortness of breath with ADL's  Review  patient continues to do well and work hard in pulmonary rehab. She is progressing nicely towards her goals and her attendance is 90%. She consistantly utilizes PLB with exertion in class and at home which eases her shortness of breath with exertion. She is excited about being evaluated for a POC as she feels this will allow her to be more independent and mobil which will make her more compliant with her home exercise prescription.    Expected Outcomes  see admission outcomes       ITP Comments:   Comments: patient has attended 17 sessions since admission

## 2017-09-07 NOTE — Progress Notes (Signed)
Daily Session Note  Patient Details  Name: [name] MRN: [id number] Date of Birth: 1951-04-08 Referring Provider:     Pulmonary Rehab Walk Test from 06/27/2017 in [place]  Referring Provider  Dr. Melvyn Novas      Encounter Date: 09/07/2017  Check In: Session Check In - 09/07/17 1030      Check-In   Location  [facility]    Staff Present  Su Hilt, MS, ACSM RCEP, Exercise Physiologist;Krisann Mckenna Rollene Rotunda, RN, BSN;Lisa Ysidro Evert, RN;Joan [employer], RN, BSN    Supervising physician immediately available to respond to emergencies  [facility] immediately available    Physician(s)  Dr. Starla Link    Medication changes reported      No    Fall or balance concerns reported     No    Tobacco Cessation  No Change    Warm-up and Cool-down  Performed as group-led instruction    Resistance Training Performed  Yes    VAD Patient?  No      Pain Assessment   Currently in Pain?  No/denies    Multiple Pain Sites  No       Capillary Blood Glucose: No results found for this or any previous visit (from the past 24 hour(s)).  Exercise Prescription Changes - 09/07/17 1240      Response to Exercise   Blood Pressure (Admit)  100/66    Blood Pressure (Exercise)  132/70    Blood Pressure (Exit)  110/66    Heart Rate (Admit)  96 bpm    Heart Rate (Exercise)  118 bpm    Heart Rate (Exit)  100 bpm    Oxygen Saturation (Admit)  99 %    Oxygen Saturation (Exercise)  96 %    Oxygen Saturation (Exit)  100 %    Rating of Perceived Exertion (Exercise)  13    Perceived Dyspnea (Exercise)  2    Duration  Progress to 45 minutes of aerobic exercise without signs/symptoms of physical distress    Intensity  THRR unchanged      Progression   Progression  Continue to progress workloads to maintain intensity without signs/symptoms of physical distress.      Resistance Training   Training Prescription  Yes    Weight  orange bands    Reps  10-15    Time  10  Minutes      Interval Training   Interval Training  No      Oxygen   Oxygen  Continuous    Liters  3      NuStep   Level  5    Minutes  17    METs  2.3      Track   Laps  10    Minutes  17       Social History   Tobacco Use  Smoking Status Former Smoker  . Packs/day: 0.50  . Years: 20.00  . Pack years: 10.00  . Types: Cigarettes  . Last attempt to quit: 11/03/2008  . Years since quitting: 8.8  Smokeless Tobacco Never Used    Goals Met:  Independence with exercise equipment Improved SOB with ADL's Using PLB without cueing & demonstrates good technique Exercise tolerated well No report of cardiac concerns or symptoms Strength training completed today  Goals Unmet:  Not Applicable  Comments: Service time is from 1030 to 1230   Dr. Rush Farmer is Medical Director for Pulmonary Rehab at [facility].

## 2017-09-12 ENCOUNTER — Encounter (HOSPITAL_COMMUNITY): Payer: Medicare Other

## 2017-09-13 ENCOUNTER — Ambulatory Visit (INDEPENDENT_AMBULATORY_CARE_PROVIDER_SITE_OTHER): Payer: Medicare Other | Admitting: Internal Medicine

## 2017-09-13 ENCOUNTER — Encounter: Payer: Self-pay | Admitting: Internal Medicine

## 2017-09-13 VITALS — BP 142/74 | HR 78 | Ht 61.0 in | Wt 111.0 lb

## 2017-09-13 DIAGNOSIS — R911 Solitary pulmonary nodule: Secondary | ICD-10-CM | POA: Diagnosis not present

## 2017-09-13 DIAGNOSIS — J9611 Chronic respiratory failure with hypoxia: Secondary | ICD-10-CM | POA: Diagnosis not present

## 2017-09-13 DIAGNOSIS — J449 Chronic obstructive pulmonary disease, unspecified: Secondary | ICD-10-CM | POA: Diagnosis not present

## 2017-09-13 MED ORDER — UMECLIDINIUM-VILANTEROL 62.5-25 MCG/INH IN AEPB: INHALATION_SPRAY | RESPIRATORY_TRACT | 11 refills | Status: DC

## 2017-09-13 NOTE — Patient Instructions (Addendum)
Plan A = Change to Anoro = maintenance inhaler = use daily no matter what  = one click each am only    be sure to rinse/ gargle after using all inhalers so they don't bother your mouth/ throat  Plan B = Backup Only use your albuterol as a rescue medication to be used if you can't catch your breath by resting or doing a relaxed purse lip breathing pattern.  - The less you use it, the better it will work when you need it. - Ok to use the inhaler up to 2 puffs  every 4 hours if you must but call for appointment if use goes up over your usual need - Don't leave home without it !!  (think of it like the spare tire for your car)   Plan C = Crisis - only use your albuterol nebulizer if you first try Plan B and it fails to help > ok to use the nebulizer up to every 4 hours but if start needing it regularly call for immediate appointment   Please schedule a follow up visit in 6  months but call sooner if needed  Add cxr on return

## 2017-09-13 NOTE — Progress Notes (Signed)
Subjective:    [name], female    DOB: 1950-12-12   MRN: [id number]      Brief patient profile:  3 yobf  Quit smoking 2010  with  GOLD III criteria copd / 0 2 dep   Significant tests/ events  She was seen by Dr [name] 12/24/08 after hospitalization for LUL pna. CT angio 3/10 showed centrilobular emphysema.She was placed on oxygen around this time [facility] pt) .  PFTs  12/2008 FEv1 39%, DLCO 35%, mild air trapping    History of Present Illness  08/28/2015 acute extended ov/[name] re: GOLD III copd/ on spiriva and freq saba and 02 2lpm hs/ 3lpm with activity  Chief Complaint  Patient presents with  . Acute Visit    Pt c/o increased SOB for the past few wks- "I have attacks"- relates to the weather change. She takes albuterol inhaler 2 x per day on average and uses neb 1-2 x per day.   baseline doe = MMRC2 = can't walk a nl pace on a flat grade s sob  >>Stiolto rx     02/08/2016 [name] follow up  Patient returns for a post hospital follow-up . Patient was admitted 01/17/2016 for COPD exacerbation with acute on chronic respiratory failure She did require initial BiPAP support. She was treated with IV antibiotics, steroids and nebulized bronchodilators. CT chest was negative for pulmonary embolism, showed emphysematous changes and a focal groundglass opacity in RML . Will need follow up CT in 3 months.  Since discharge. Patient is feeling better with less cough and dyspnea.  She is on Advair and Spiriva . Was previously on Stiolto . Wants to go back on Stiolto.  She denies any chest pain, orthopnea, PND, or increased leg swelling. On oxygen 2l/m rest and 3l/m w/ act.  Says her PCP has extended her on prednisone , unsure of dose.  She says she will bring bottle to next visit. Says her appetite is much better.  PVX and Prevnar utd.  rec Stop Advair and Spiriva  Restart Stiolto daily, rinse after use.  Set up CT chest in  2 months  Follow up [name]   In 2 months (after CT chest )     03/31/2016 acute extended ov/[name] re: aecopd on anoro req by insurance   2lpm rest/3lpm  Chief Complaint  Patient presents with  . Acute Visit    Pt c/o increased DOE for the past 1-2 wks. She has been using neb at least once per day.  She also c/o facial puffiness and HA "I have had a head cold". She has had minimal cough with white sputum.     easily confused with details of care/ overusing neb and not using saba hfa   Not clear what the alternatives to anoro are. Doe = MMRC3 = can't walk 100 yards even at a slow pace at a flat grade s stopping due to sob  On 3lpm and uses w/c usually to go out.  rec Plan A = Automatic = Stiolto 2 each am  Plan B = Backup Only use your albuterol as a rescue medication  Plan C = Crisis - only use your albuterol nebulizer if you first try Plan B and it fails to help > ok to use the nebulizer up to every 4 hours but if start needing it regularly call for immediate appointment Plan D = Doctor - call me if B and C not adequate Plan E =  ER - go to ER or call 911 if all else fails   Prednisone 10 mg take  4 each am x 2 days,   2 each am x 2 days,  1 each am x 2 days and stop    04/13/2016  f/u ov/[name] re: copd III/ 02 3lpm / stiolto  Chief Complaint  Patient presents with  . Follow-up    breathing has not been the same since being out of hospital, more dependant on her oxygen now, hard to get out and do anything.    no change doe on 02 , much less need for  rescue now  Better p pred and no worse off it  Doe = MMRC4  = sob if tries to leave home or while getting dressed  rec Call for alternatives to Stiolto if your insurance won't  Pay enough for it to make it affordable      10/26/2016  f/u ov/[name] re:  Copd GOLD III/ 3lpm x use 2lpm hs/ stiolto Chief Complaint  Patient presents with  . Follow-up    Breathing has returned back to her normal baseline. She states she has a "slight cold"- has minimal cough with  greyish sputum.    sleeps ok / rare need for saba hfa/ never neb  Doe = MMRC3 = can't walk 100 yards even at a slow pace at a flat grade s stopping due to sob  Even on 02  rec Plan A = Automatic = Stiolto 2 each am  Plan B = Backup Only use your albuterol  Plan C = Crisis - only use your albuterol nebulizer if you first try Plan B     04/25/2017  f/u ov/[name] re: COPD GOLD III/  3lpm with ambulation/ 2lpm otherwise  Chief Complaint  Patient presents with  . Follow-up    Breathing is doing about the same. She denies any new co's. She has not used her albuterol inhaler or neb.   doe still MMRC 3  No need for saba Sleeping fine on 2lpm  Has never done pulmonary rehab  Rec No change in medications Work on maintaining optimal inhaler technique:    Please see patient coordinator before you leave today  to schedule rehab referral Please schedule a follow up visit in 6 months but call sooner if needed with cxr on return      09/13/2017  f/u ov/[name] re: copd  GOLD III/ still in pulmonary rehab reliant on samples 3lpm with activity/ 2lpm  Chief Complaint  Patient presents with  . Follow-up    Pt states that she has been good since last visit. Pt was given sample of Bevespi but does not think she is getting enough effect out of the medication and wants to switch to a different inhaler. States that she has been going to rehab and feels she is becoming stronger. DME: [facility], 3L with ambulation and 2L at rest.   never needs any albuterol in any form  Doe = MMRC2 = can't walk a nl pace on a flat grade s sob but does fine slow and flat on 02 pushing cart  Sleeping fine on 2lpm  Improved overall in rehab  No obvious day to day or daytime variability or assoc excess/ purulent sputum or mucus plugs or hemoptysis or cp or chest tightness, subjective wheeze or overt sinus or hb symptoms. No unusual exposure hx or h/o childhood pna/ asthma or knowledge of premature birth.  Sleeping  ok flat on  2lpm  without nocturnal  or early am exacerbation  of respiratory  c/o's or need for noct saba. Also denies any obvious fluctuation of symptoms with weather or environmental changes or other aggravating or alleviating factors except as outlined above   Current Allergies, Complete Past Medical History, Past Surgical History, Family History, and Social History were reviewed in [employer] record.  ROS  The following are not active complaints unless bolded Hoarseness, sore throat, dysphagia, dental problems, itching, sneezing,  nasal congestion or discharge of excess mucus or purulent secretions, ear ache,   fever, chills, sweats, unintended wt loss or wt gain, classically pleuritic or exertional cp,  orthopnea pnd or leg swelling, presyncope, palpitations, abdominal pain, anorexia, nausea, vomiting, diarrhea  or change in bowel habits or change in bladder habits, change in stools or change in urine, dysuria, hematuria,  rash, arthralgias, visual complaints, headache, numbness, weakness or ataxia or problems with walking or coordination,  change in mood/affect or memory.        Current Meds  Medication Sig  . albuterol (PROVENTIL HFA;VENTOLIN HFA) 108 (90 BASE) MCG/ACT inhaler Inhale 2 puffs into the lungs every 4 (four) hours as needed for wheezing or shortness of breath.  Marland Kitchen albuterol (PROVENTIL) (2.5 MG/3ML) 0.083% nebulizer solution Take 3 mLs (2.5 mg total) by nebulization every 2 (two) hours as needed for wheezing or shortness of breath.  Marland Kitchen alendronate (FOSAMAX) 70 MG tablet TAKE 1 TABLET ONCE A WEEK  . aspirin EC 81 MG tablet Take 81 mg by mouth daily.  Marland Kitchen atenolol (TENORMIN) 25 MG tablet Take 25 mg by mouth daily.  . Cyanocobalamin (VITAMIN B-12 PO) Take 1 tablet by mouth daily.  Marland Kitchen guaiFENesin (MUCINEX) 600 MG 12 hr tablet Take 1 tablet (600 mg total) by mouth 2 (two) times daily.  Marland Kitchen levothyroxine (SYNTHROID, LEVOTHROID) 75 MCG tablet Take 75 mcg by mouth daily.    Marland Kitchen lovastatin (MEVACOR) 20 MG tablet Take 20 mg by mouth at bedtime.  . mirtazapine (REMERON) 15 MG tablet Take 15 mg by mouth at bedtime as needed. sleep  . Multiple Vitamin (MULTIVITAMIN WITH MINERALS) TABS tablet Take 1 tablet by mouth daily.  . OXYGEN Pt uses 2lpm with sleep and 3 lpm with exertion  DME- [facility]  .   Glycopyrrolate-Formoterol (BEVESPI AEROSPHERE) 9-4.8 MCG/ACT AERO Inhale 2 puffs into the lungs 2 (two) times daily.                     Objective:   Physical Exam    Elderly thin bf nad  09/13/2017     111  04/25/2017      107 10/26/2016      105  04/13/2016       98   03/31/16 97 lb (43.999 kg)  02/08/16 94 lb (42.638 kg)  01/22/16 92 lb 3.2 oz (41.822 kg)       Vital signs reviewed - Note on arrival 02 sats  94% on RA       Lungs: no use of accessory muscles, no dullness to percussion, decreased bs bilaterally  without rales or rhonchi  ABD:  Soft/ pos hoover's early on insp   HEENT: nl   turbinates bilaterally, and oropharynx. Nl external ear canals without cough reflex - edentulous   NECK :  without JVD/Nodes/TM/ nl carotid upstrokes bilaterally   LUNGS: no acc muscle use,  slt barrel contour chest with distant bs bilaterally s wheeze   CV:  RRR  no  s3 or murmur or increase in P2, and no edema   ABD:  soft and nontender with nl inspiratory excursion in the supine position. No bruits or organomegaly appreciated, bowel sounds nl  MS:  Nl gait/ ext warm without deformities, calf tenderness, cyanosis or clubbing No obvious joint restrictions   SKIN: warm and dry without lesions    NEURO:  alert, approp, nl sensorium with  no motor or cerebellar deficits apparent.                  Assessment & Plan:

## 2017-09-14 ENCOUNTER — Encounter (HOSPITAL_COMMUNITY): Payer: Medicare Other

## 2017-09-14 ENCOUNTER — Encounter: Payer: Self-pay | Admitting: Internal Medicine

## 2017-09-14 NOTE — Assessment & Plan Note (Signed)
PFTs  12/2008 FEv1 39%, DLCO 35%, mild air trapping  - 04/25/2017  After extensive coaching HFA effectiveness =    75% (short Ti) - Referred 04/25/2017 rehab - 06/10/2017 insurance won't cover stiolto > changed to bevespi 2bid but preferred anoro  - 09/13/2017  After extensive coaching device  effectiveness =   90%   Pt is Group B in terms of symptom/risk and laba/lama therefore appropriate rx at this point and she does fine with dpi and prefers anoro.  Formulary restrictions will be an ongoing challenge for the forseable future and I would be happy to pick an alternative if the pt will first  provide me a list of them but pt  will need to return here for training for any new device that is required eg dpi vs hfa vs respimat.    In meantime we can always provide samples so the patient never runs out of any needed respiratory medications.   Each maintenance medication was reviewed in detail including most importantly the difference between maintenance and as needed and under what circumstances the prns are to be used.  Please see AVS for specific  Instructions which are unique to this visit and I personally typed out  which were reviewed in detail in writing with the patient and a copy provided.

## 2017-09-14 NOTE — Assessment & Plan Note (Signed)
rx 2lpm rest/ 3lpm with exertion as of 09/13/2017

## 2017-09-14 NOTE — Assessment & Plan Note (Signed)
CT chest 01/2016 >Patchyground-glass changes demonstrated focally in the right middle lungat 1.4 cm diameter>repeat CT chest in 04/2016  CT chest 7/11//2017 >4 mm nodule in LUL  Discussed in detail all the  indications, usual  risks and alternatives  relative to the benefits with patient who agrees to proceed with conservative f/u as outlined  = cxr on return, not a candidate for any form of aggressive rx based on severity of copd.

## 2017-09-19 ENCOUNTER — Encounter (HOSPITAL_COMMUNITY)
Admission: RE | Admit: 2017-09-19 | Discharge: 2017-09-19 | Disposition: A | Discharge status: Home / Self Care | Payer: Medicare Other | Source: Ambulatory Visit | Attending: Internal Medicine | Admitting: Internal Medicine

## 2017-09-19 VITALS — Wt 107.6 lb

## 2017-09-19 DIAGNOSIS — J9611 Chronic respiratory failure with hypoxia: Secondary | ICD-10-CM

## 2017-09-19 NOTE — Progress Notes (Signed)
Daily Session Note  Patient Details  Name: [name] MRN: [id number] Date of Birth: 1951/07/21 Referring Provider:     Pulmonary Rehab Walk Test from 06/27/2017 in [place]  Referring Provider  Dr. Melvyn Novas      Encounter Date: 09/19/2017  Check In: Session Check In - 09/19/17 1030      Check-In   Location  [facility]    Staff Present  Su Hilt, MS, ACSM RCEP, Exercise Physiologist;Edith Lord Rollene Rotunda, RN, BSN;Lisa Ysidro Evert, RN;Joan Leonia Reeves, RN, BSN    Supervising physician immediately available to respond to emergencies  [facility] immediately available    Physician(s)  Dr. Eliseo Squires    Medication changes reported      No    Fall or balance concerns reported     No    Tobacco Cessation  No Change    Warm-up and Cool-down  Performed as group-led instruction    Resistance Training Performed  Yes    VAD Patient?  No      Pain Assessment   Currently in Pain?  No/denies    Multiple Pain Sites  No       Capillary Blood Glucose: No results found for this or any previous visit (from the past 24 hour(s)).  Exercise Prescription Changes - 09/19/17 1227      Response to Exercise   Blood Pressure (Admit)  116/70    Blood Pressure (Exercise)  110/60    Blood Pressure (Exit)  114/60    Heart Rate (Admit)  72 bpm    Heart Rate (Exercise)  86 bpm    Heart Rate (Exit)  73 bpm    Oxygen Saturation (Admit)  100 %    Oxygen Saturation (Exercise)  97 %    Oxygen Saturation (Exit)  99 %    Rating of Perceived Exertion (Exercise)  13    Perceived Dyspnea (Exercise)  2    Duration  Progress to 45 minutes of aerobic exercise without signs/symptoms of physical distress    Intensity  THRR unchanged      Progression   Progression  Continue to progress workloads to maintain intensity without signs/symptoms of physical distress.      Resistance Training   Training Prescription  Yes    Weight  orange bands    Reps  10-15    Time  10  Minutes      Interval Training   Interval Training  No      Oxygen   Oxygen  Continuous    Liters  3      NuStep   Level  5    Minutes  17    METs  1.5      Arm Ergometer   Level  4    Minutes  17      Track   Laps  5    Minutes  17       Social History   Tobacco Use  Smoking Status Former Smoker  . Packs/day: 0.50  . Years: 20.00  . Pack years: 10.00  . Types: Cigarettes  . Last attempt to quit: 11/03/2008  . Years since quitting: 8.8  Smokeless Tobacco Never Used    Goals Met:  Independence with exercise equipment Improved SOB with ADL's Using PLB without cueing & demonstrates good technique Exercise tolerated well No report of cardiac concerns or symptoms Strength training completed today  Goals Unmet:  Not Applicable  Comments: Service time is from 1030 to  [place]   Dr. Rush Farmer is Medical Director for Pulmonary Rehab at [facility].

## 2017-09-21 ENCOUNTER — Encounter (HOSPITAL_COMMUNITY)
Admission: RE | Admit: 2017-09-21 | Discharge: 2017-09-21 | Disposition: A | Discharge status: Home / Self Care | Payer: Medicare Other | Source: Ambulatory Visit | Attending: Internal Medicine | Admitting: Internal Medicine

## 2017-09-21 VITALS — Wt 108.5 lb

## 2017-09-21 DIAGNOSIS — J9611 Chronic respiratory failure with hypoxia: Secondary | ICD-10-CM | POA: Diagnosis not present

## 2017-09-21 NOTE — Progress Notes (Signed)
Daily Session Note  Patient Details  Name: [name] MRN: [id number] Date of Birth: [birth year] Referring Provider:     Pulmonary Rehab Walk Test from 06/27/2017 in [place]  Referring Provider  Dr. Melvyn Novas      Encounter Date: 09/21/2017  Check In: Session Check In - 09/21/17 1120      Check-In   Location  [facility]    Staff Present  Rodney Langton, RN;Molly diVincenzo, MS, ACSM RCEP, Exercise Physiologist;Portia Rollene Rotunda, RN, BSN    Supervising physician immediately available to respond to emergencies  [facility] immediately available    Physician(s)  Dr. Wendee Beavers    Medication changes reported      No    Fall or balance concerns reported     No    Tobacco Cessation  No Change    Warm-up and Cool-down  Performed as group-led instruction    Resistance Training Performed  Yes    VAD Patient?  No      Pain Assessment   Currently in Pain?  No/denies    Multiple Pain Sites  No       Capillary Blood Glucose: No results found for this or any previous visit (from the past 24 hour(s)).  Exercise Prescription Changes - 09/21/17 1200      Response to Exercise   Blood Pressure (Admit)  126/60    Blood Pressure (Exercise)  114/70    Blood Pressure (Exit)  130/58    Heart Rate (Admit)  92 bpm    Heart Rate (Exercise)  112 bpm    Heart Rate (Exit)  97 bpm    Oxygen Saturation (Admit)  97 %    Oxygen Saturation (Exercise)  93 %    Oxygen Saturation (Exit)  97 %    Rating of Perceived Exertion (Exercise)  15    Perceived Dyspnea (Exercise)  3    Duration  Progress to 45 minutes of aerobic exercise without signs/symptoms of physical distress    Intensity  THRR unchanged      Progression   Progression  Continue to progress workloads to maintain intensity without signs/symptoms of physical distress.      Resistance Training   Training Prescription  Yes    Weight  orange bands    Reps  10-15    Time  10 Minutes      Interval  Training   Interval Training  No      Oxygen   Oxygen  Continuous    Liters  3      Arm Ergometer   Level  4    Minutes  17      Track   Laps  12    Minutes  17       Social History   Tobacco Use  Smoking Status Former Smoker  . Packs/day: 0.50  . Years: 20.00  . Pack years: 10.00  . Types: Cigarettes  . Last attempt to quit: 11/03/2008  . Years since quitting: 8.8  Smokeless Tobacco Never Used    Goals Met:  Exercise tolerated well No report of cardiac concerns or symptoms Strength training completed today  Goals Unmet:  Not Applicable  Comments: Service time is from 1030 to [place]    Dr. Rush Farmer is Medical Director for Pulmonary Rehab at [facility].

## 2017-09-28 ENCOUNTER — Encounter (HOSPITAL_COMMUNITY)
Admission: RE | Admit: 2017-09-28 | Discharge: 2017-09-28 | Disposition: A | Discharge status: Home / Self Care | Payer: Medicare Other | Source: Ambulatory Visit | Attending: Internal Medicine | Admitting: Internal Medicine

## 2017-09-28 VITALS — Wt 108.5 lb

## 2017-09-28 DIAGNOSIS — J9611 Chronic respiratory failure with hypoxia: Secondary | ICD-10-CM

## 2017-09-28 NOTE — Progress Notes (Signed)
Pulmonary Individual Treatment Plan  Patient Details  Name: [name] MRN: [id number] Date of Birth: 11-Dec-1950 Referring Provider:     Pulmonary Rehab Walk Test from 06/27/2017 in [place]  Referring Provider  Dr. Melvyn Novas      Initial Encounter Date:    Pulmonary Rehab Walk Test from 06/27/2017 in [place]  Date  06/29/17  Referring Provider  Dr. Melvyn Novas      Visit Diagnosis: Chronic respiratory failure with hypoxia ([place])  Patient's Home Medications on Admission:   Current Outpatient Medications:  .  albuterol (PROVENTIL HFA;VENTOLIN HFA) 108 (90 BASE) MCG/ACT inhaler, Inhale 2 puffs into the lungs every 4 (four) hours as needed for wheezing or shortness of breath., Disp: 18 g, Rfl: 0 .  albuterol (PROVENTIL) (2.5 MG/3ML) 0.083% nebulizer solution, Take 3 mLs (2.5 mg total) by nebulization every 2 (two) hours as needed for wheezing or shortness of breath., Disp: 75 mL, Rfl: 0 .  alendronate (FOSAMAX) 70 MG tablet, TAKE 1 TABLET ONCE A WEEK, Disp: , Rfl: 11 .  aspirin EC 81 MG tablet, Take 81 mg by mouth daily., Disp: , Rfl:  .  atenolol (TENORMIN) 25 MG tablet, Take 25 mg by mouth daily., Disp: , Rfl:  .  Cyanocobalamin (VITAMIN B-12 PO), Take 1 tablet by mouth daily., Disp: , Rfl:  .  guaiFENesin (MUCINEX) 600 MG 12 hr tablet, Take 1 tablet (600 mg total) by mouth 2 (two) times daily., Disp: 15 tablet, Rfl: 0 .  levothyroxine (SYNTHROID, LEVOTHROID) 75 MCG tablet, Take 75 mcg by mouth daily., Disp: , Rfl: 3 .  lovastatin (MEVACOR) 20 MG tablet, Take 20 mg by mouth at bedtime., Disp: , Rfl:  .  mirtazapine (REMERON) 15 MG tablet, Take 15 mg by mouth at bedtime as needed. sleep, Disp: , Rfl:  .  Multiple Vitamin (MULTIVITAMIN WITH MINERALS) TABS tablet, Take 1 tablet by mouth daily., Disp: , Rfl:  .  OXYGEN, Pt uses 2lpm with sleep and 3 lpm with exertion  DME- [facility], Disp: , Rfl:  .  umeclidinium-vilanterol (ANORO  ELLIPTA) 62.5-25 MCG/INH AEPB, Only open the device one time= one click  and then take your two separate drags to be sure you get it all, Disp: 1 each, Rfl: 11  Past Medical History: Past Medical History:  Diagnosis Date  . Acute on chronic respiratory failure ([place]) 01/17/2016  . Chronic bronchitis   . COPD (chronic obstructive pulmonary disease) ([place])   . Hyperlipidemia   . Hypertension   . Hypothyroidism   . Post-menopausal     Tobacco Use: Social History   Tobacco Use  Smoking Status Former Smoker  . Packs/day: 0.50  . Years: 20.00  . Pack years: 10.00  . Types: Cigarettes  . Last attempt to quit: 11/03/2008  . Years since quitting: 8.9  Smokeless Tobacco Never Used    Labs: Recent Review Flowsheet Data    Labs for ITP Cardiac and Pulmonary Rehab Latest Ref Rng & Units 10/22/2008 10/22/2008 01/17/2016   PHART 7.350 - 7.400 - 7.455(H) -   PCO2ART 35.0 - 45.0 mmHg - 38.0 -   HCO3 20.0 - 24.0 mEq/L - 26.3(H) 29.5(H)   TCO2 0 - 100 mmol/L 26 23.5 31   O2SAT % - 81.1 35.0      Capillary Blood Glucose: Lab Results  Component Value Date   GLUCAP 158 (H) 01/22/2016   GLUCAP 149 (H) 01/22/2016   GLUCAP 147 (H) 01/21/2016  GLUCAP 175 (H) 01/21/2016   GLUCAP 207 (H) 01/21/2016     Pulmonary Assessment Scores:   Pulmonary Function Assessment: Pulmonary Function Assessment - 06/26/17 1115      Breath   Bilateral Breath Sounds  Clear    Shortness of Breath  Yes;Limiting activity;Panic with Shortness of Breath       Exercise Target Goals:    Exercise Program Goal: Individual exercise prescription set with THRR, safety & activity barriers. Participant demonstrates ability to understand and report RPE using BORG scale, to self-measure pulse accurately, and to acknowledge the importance of the exercise prescription.  Exercise Prescription Goal: Starting with aerobic activity 30 plus minutes a day, 3 days per week for initial exercise prescription. Provide home  exercise prescription and guidelines that participant acknowledges understanding prior to discharge.  Activity Barriers & Risk Stratification: Activity Barriers & Cardiac Risk Stratification - 06/26/17 1103      Activity Barriers & Cardiac Risk Stratification   Activity Barriers  Deconditioning;Shortness of Breath;Muscular Weakness       6 Minute Walk: 6 Minute Walk    Row Name 06/29/17 0709         6 Minute Walk   Phase  Initial     Distance  900 feet     Walk Time  6 minutes     # of Rest Breaks  0     MPH  1.7     METS  2.3     RPE  12     Perceived Dyspnea   1     Symptoms  Yes (comment)     Comments  used wheelchair     Resting HR  70 bpm     Resting BP  163/83     Resting Oxygen Saturation   95 %     Exercise Oxygen Saturation  during 6 min walk  88 %     Max Ex. HR  90 bpm     Max Ex. BP  177/72     2 Minute Post BP  167/71       Interval HR   1 Minute HR  80     2 Minute HR  84     3 Minute HR  84     4 Minute HR  88     5 Minute HR  90     6 Minute HR  90     2 Minute Post HR  73     Interval Heart Rate?  Yes       Interval Oxygen   Interval Oxygen?  Yes     Baseline Oxygen Saturation %  95 %     1 Minute Oxygen Saturation %  98 %     1 Minute Liters of Oxygen  3 L     2 Minute Oxygen Saturation %  95 %     2 Minute Liters of Oxygen  3 L     3 Minute Oxygen Saturation %  95 %     3 Minute Liters of Oxygen  3 L     4 Minute Oxygen Saturation %  99 %     4 Minute Liters of Oxygen  3 L     5 Minute Oxygen Saturation %  93 %     5 Minute Liters of Oxygen  3 L     6 Minute Oxygen Saturation %  88 %     6 Minute Liters of Oxygen  3  L     2 Minute Post Oxygen Saturation %  99 %     2 Minute Post Liters of Oxygen  3 L        Oxygen Initial Assessment: Oxygen Initial Assessment - 06/29/17 0716      Initial 6 min Walk   Oxygen Used  Continuous;E-Tanks    Liters per minute  3      Program Oxygen Prescription   Program Oxygen Prescription   Continuous;E-Tanks    Liters per minute  3       Oxygen Re-Evaluation: Oxygen Re-Evaluation    Row Name 07/23/17 2148 08/14/17 1535 09/05/17 0743 09/28/17 0647       Program Oxygen Prescription   Program Oxygen Prescription  Continuous;E-Tanks  Continuous;E-Tanks  Continuous;E-Tanks  Continuous;E-Tanks    Liters per minute  '3  3  3  3      ' Home Oxygen   Home Oxygen Device  E-Tanks;Home Concentrator  E-Tanks;Home Concentrator  E-Tanks;Home Concentrator  E-Tanks;Home Concentrator    Sleep Oxygen Prescription  Continuous  Continuous  Continuous  Continuous    Liters per minute  '2  2  2  2    ' Home Exercise Oxygen Prescription  Continuous  Continuous  Continuous  Continuous    Liters per minute  '3  3  3  3    ' Home at Rest Exercise Oxygen Prescription  Continuous  Continuous  Continuous  Continuous    Liters per minute  '2  2  2  2    ' Compliance with Home Oxygen Use  Yes  Yes  Yes  Yes      Goals/Expected Outcomes   Short Term Goals  To learn and exhibit compliance with exercise, home and travel O2 prescription;To learn and understand importance of monitoring SPO2 with pulse oximeter and demonstrate accurate use of the pulse oximeter.;To learn and demonstrate proper pursed lip breathing techniques or other breathing techniques.;To learn and understand importance of maintaining oxygen saturations>88%;To learn and demonstrate proper use of respiratory medications  To learn and exhibit compliance with exercise, home and travel O2 prescription;To learn and understand importance of monitoring SPO2 with pulse oximeter and demonstrate accurate use of the pulse oximeter.;To learn and demonstrate proper pursed lip breathing techniques or other breathing techniques.;To learn and understand importance of maintaining oxygen saturations>88%;To learn and demonstrate proper use of respiratory medications  To learn and exhibit compliance with exercise, home and travel O2 prescription;To learn and understand  importance of monitoring SPO2 with pulse oximeter and demonstrate accurate use of the pulse oximeter.;To learn and demonstrate proper pursed lip breathing techniques or other breathing techniques.;To learn and understand importance of maintaining oxygen saturations>88%;To learn and demonstrate proper use of respiratory medications  To learn and exhibit compliance with exercise, home and travel O2 prescription;To learn and understand importance of monitoring SPO2 with pulse oximeter and demonstrate accurate use of the pulse oximeter.;To learn and demonstrate proper pursed lip breathing techniques or other breathing techniques.;To learn and understand importance of maintaining oxygen saturations>88%;To learn and demonstrate proper use of respiratory medications    Long  Term Goals  Exhibits compliance with exercise, home and travel O2 prescription;Verbalizes importance of monitoring SPO2 with pulse oximeter and return demonstration;Exhibits proper breathing techniques, such as pursed lip breathing or other method taught during program session;Maintenance of O2 saturations>88%;Compliance with respiratory medication  Exhibits compliance with exercise, home and travel O2 prescription;Verbalizes importance of monitoring SPO2 with pulse oximeter and return demonstration;Exhibits proper breathing techniques, such as pursed  lip breathing or other method taught during program session;Maintenance of O2 saturations>88%;Compliance with respiratory medication  Exhibits compliance with exercise, home and travel O2 prescription;Verbalizes importance of monitoring SPO2 with pulse oximeter and return demonstration;Exhibits proper breathing techniques, such as pursed lip breathing or other method taught during program session;Maintenance of O2 saturations>88%;Compliance with respiratory medication  Exhibits compliance with exercise, home and travel O2 prescription;Verbalizes importance of monitoring SPO2 with pulse oximeter and  return demonstration;Exhibits proper breathing techniques, such as pursed lip breathing or other method taught during program session;Maintenance of O2 saturations>88%;Compliance with respiratory medication    Comments  patient verbalizes compliance with home o2 and demonstrates personal spo2 monitoring.  patient verbalizes compliance with home o2 and demonstrates personal spo2 monitoring.  patient verbalizes compliance with home o2 and demonstrates personal spo2 monitoring.  patient verbalizes compliance with home o2 and demonstrates personal spo2 monitoring.       Oxygen Discharge (Final Oxygen Re-Evaluation): Oxygen Re-Evaluation - 09/28/17 0647      Program Oxygen Prescription   Program Oxygen Prescription  Continuous;E-Tanks    Liters per minute  3      Home Oxygen   Home Oxygen Device  E-Tanks;Home Concentrator    Sleep Oxygen Prescription  Continuous    Liters per minute  2    Home Exercise Oxygen Prescription  Continuous    Liters per minute  3    Home at Rest Exercise Oxygen Prescription  Continuous    Liters per minute  2    Compliance with Home Oxygen Use  Yes      Goals/Expected Outcomes   Short Term Goals  To learn and exhibit compliance with exercise, home and travel O2 prescription;To learn and understand importance of monitoring SPO2 with pulse oximeter and demonstrate accurate use of the pulse oximeter.;To learn and demonstrate proper pursed lip breathing techniques or other breathing techniques.;To learn and understand importance of maintaining oxygen saturations>88%;To learn and demonstrate proper use of respiratory medications    Long  Term Goals  Exhibits compliance with exercise, home and travel O2 prescription;Verbalizes importance of monitoring SPO2 with pulse oximeter and return demonstration;Exhibits proper breathing techniques, such as pursed lip breathing or other method taught during program session;Maintenance of O2 saturations>88%;Compliance with respiratory  medication    Comments  patient verbalizes compliance with home o2 and demonstrates personal spo2 monitoring.       Initial Exercise Prescription: Initial Exercise Prescription - 06/29/17 0700      Date of Initial Exercise RX and Referring Provider   Date  06/29/17    Referring Provider  Dr. Melvyn Novas      NuStep   Level  2    Minutes  17    METs  1.2      Arm Ergometer   Level  1    Minutes  17      Track   Laps  7    Minutes  17      Prescription Details   Frequency (times per week)  2    Duration  Progress to 45 minutes of aerobic exercise without signs/symptoms of physical distress      Intensity   THRR 40-80% of Max Heartrate  61-122    Ratings of Perceived Exertion  11-13    Perceived Dyspnea  0-4      Progression   Progression  Continue progressive overload as per policy without signs/symptoms or physical distress.      Resistance Training   Training Prescription  Yes  Weight  orange bands    Reps  10-15       Perform Capillary Blood Glucose checks as needed.  Exercise Prescription Changes: Exercise Prescription Changes    Row Name 07/04/17 1200 07/06/17 1300 07/11/17 1300 07/13/17 1200 07/18/17 1200     Response to Exercise   Blood Pressure (Admit)  134/90  134/74  102/64  154/80  122/68   Blood Pressure (Exercise)  140/80  140/78  140/76  120/70  122/80   Blood Pressure (Exit)  100/70  108/70  104/70  156/77  106/64   Heart Rate (Admit)  81 bpm  75 bpm  77 bpm  77 bpm  75 bpm   Heart Rate (Exercise)  86 bpm  81 bpm  82 bpm  85 bpm  92 bpm   Heart Rate (Exit)  68 bpm  64 bpm  73 bpm  67 bpm  66 bpm   Oxygen Saturation (Admit)  97 %  98 %  100 %  100 %  99 %   Oxygen Saturation (Exercise)  98 %  94 %  98 %  98 %  95 %   Oxygen Saturation (Exit)  97 %  99 %  98 %  99 %  97 %   Rating of Perceived Exertion (Exercise)  '14  13  11  12  13   ' Perceived Dyspnea (Exercise)  '2  2  1  1  2   ' Symptoms  -  -  -  -  1   Duration  Progress to 45 minutes of aerobic  exercise without signs/symptoms of physical distress  Progress to 45 minutes of aerobic exercise without signs/symptoms of physical distress  Progress to 45 minutes of aerobic exercise without signs/symptoms of physical distress  Progress to 45 minutes of aerobic exercise without signs/symptoms of physical distress  Progress to 45 minutes of aerobic exercise without signs/symptoms of physical distress   Intensity  Other (comment) 40-80% of HRR  THRR unchanged  THRR unchanged  THRR unchanged  THRR unchanged     Progression   Progression  Continue to progress workloads to maintain intensity without signs/symptoms of physical distress.  Continue to progress workloads to maintain intensity without signs/symptoms of physical distress.  Continue to progress workloads to maintain intensity without signs/symptoms of physical distress.  Continue to progress workloads to maintain intensity without signs/symptoms of physical distress.  Continue to progress workloads to maintain intensity without signs/symptoms of physical distress.     Resistance Training   Training Prescription  Yes  Yes  Yes  Yes  Yes   Weight  orange bands  orange bands  orange bands  orange bands  orange bands   Reps  10-15  10-15  10-15  10-15  10-15   Time  10 Minutes  10 Minutes  10 Minutes  10 Minutes  10 Minutes     Interval Training   Interval Training  Yes  No  No  No  No     Oxygen   Oxygen  Continuous  Continuous  Continuous  Continuous  Continuous   Liters  '3  3  3  3  3     ' NuStep   Level  '2  2  2  2  3   ' Minutes  '17  17  17  17  17   ' METs  1.5  1.6  1.8  1.3  1.7     Arm Ergometer   Level  1  -  -  2  3   Minutes  17  -  -  17  17     Track   Laps  '6  10  9  8  11   ' Minutes  '17  17  17  17  17   ' Row Name 07/20/17 1200 07/25/17 1200 07/27/17 1200 08/01/17 1200 08/08/17 1224     Response to Exercise   Blood Pressure (Admit)  134/80  124/80  140/80  134/72  144/80   Blood Pressure (Exercise)  130/70  154/86  -   150/70  120/74   Blood Pressure (Exit)  110/74  120/60  120/64  118/70  126/70   Heart Rate (Admit)  81 bpm  97 bpm  75 bpm  83 bpm  74 bpm   Heart Rate (Exercise)  92 bpm  121 bpm  83 bpm  94 bpm  93 bpm   Heart Rate (Exit)  72 bpm  103 bpm  77 bpm  81 bpm  76 bpm   Oxygen Saturation (Admit)  100 %  96 %  98 %  97 %  99 %   Oxygen Saturation (Exercise)  94 %  94 %  95 %  93 %  94 %   Oxygen Saturation (Exit)  98 %  98 %  97 %  100 %  100 %   Rating of Perceived Exertion (Exercise)  '15  13  9  15  11   ' Perceived Dyspnea (Exercise)  '3  1  1  2  1   ' Duration  Progress to 45 minutes of aerobic exercise without signs/symptoms of physical distress  Progress to 45 minutes of aerobic exercise without signs/symptoms of physical distress  Progress to 45 minutes of aerobic exercise without signs/symptoms of physical distress  Progress to 45 minutes of aerobic exercise without signs/symptoms of physical distress  Progress to 45 minutes of aerobic exercise without signs/symptoms of physical distress   Intensity  THRR unchanged  THRR unchanged  THRR unchanged  THRR unchanged  THRR unchanged     Progression   Progression  Continue to progress workloads to maintain intensity without signs/symptoms of physical distress.  Continue to progress workloads to maintain intensity without signs/symptoms of physical distress.  Continue to progress workloads to maintain intensity without signs/symptoms of physical distress.  Continue to progress workloads to maintain intensity without signs/symptoms of physical distress.  Continue to progress workloads to maintain intensity without signs/symptoms of physical distress.     Resistance Training   Training Prescription  Yes  Yes  Yes  Yes  Yes   Weight  orange bands  orange bands  orange bands  orange bands  orange bands   Reps  10-15  10-15  10-15  10-15  10-15   Time  10 Minutes  10 Minutes  10 Minutes  10 Minutes  10 Minutes     Interval Training   Interval Training  No   No  No  No  No     Oxygen   Oxygen  Continuous  Continuous  Continuous  Continuous  Continuous   Liters  '3  3  3  3  3     ' NuStep   Level  -  '3  3  3  3   ' Minutes  -  '17  17  17  17   ' METs  -  1.6  1.8  -  2     Arm Ergometer   Level  3  3  -  3  3   Minutes  17  17  -  [place]  10  -  14  13   Minutes  17  17  -  17  17   Row Name 08/10/17 1334 08/15/17 1200 08/17/17 1200 08/22/17 1200 08/29/17 1200     Response to Exercise   Blood Pressure (Admit)  106/62  104/60  140/70  108/64  132/80   Blood Pressure (Exercise)  166/90  132/70  110/60  106/70  120/74   Blood Pressure (Exit)  122/80  120/72  140/70  128/66  148/78   Heart Rate (Admit)  72 bpm  79 bpm  69 bpm  94 bpm  76 bpm   Heart Rate (Exercise)  86 bpm  105 bpm  76 bpm  118 bpm  91 bpm   Heart Rate (Exit)  78 bpm  75 bpm  64 bpm  101 bpm  70 bpm   Oxygen Saturation (Admit)  96 %  99 %  100 %  100 %  98 %   Oxygen Saturation (Exercise)  93 %  92 %  97 %  98 %  88 %   Oxygen Saturation (Exit)  90 %  100 %  99 %  91 %  100 %   Rating of Perceived Exertion (Exercise)  '9  11  11  11  11   ' Perceived Dyspnea (Exercise)  '1  1  1  1  1   ' Duration  Progress to 45 minutes of aerobic exercise without signs/symptoms of physical distress  Progress to 45 minutes of aerobic exercise without signs/symptoms of physical distress  Progress to 45 minutes of aerobic exercise without signs/symptoms of physical distress  Progress to 45 minutes of aerobic exercise without signs/symptoms of physical distress  Progress to 45 minutes of aerobic exercise without signs/symptoms of physical distress   Intensity  THRR unchanged  THRR unchanged  THRR unchanged  THRR unchanged  THRR unchanged     Progression   Progression  Continue to progress workloads to maintain intensity without signs/symptoms of physical distress.  Continue to progress workloads to maintain intensity without signs/symptoms of physical distress.  Continue to  progress workloads to maintain intensity without signs/symptoms of physical distress.  Continue to progress workloads to maintain intensity without signs/symptoms of physical distress.  Continue to progress workloads to maintain intensity without signs/symptoms of physical distress.     Resistance Training   Training Prescription  Yes  Yes  Yes  Yes  Yes   Weight  orange bands  orange bands  orange bands  orange bands  orange bands   Reps  10-15  10-15  10-15  10-15  10-15   Time  10 Minutes  10 Minutes  10 Minutes  10 Minutes  10 Minutes     Interval Training   Interval Training  No  No  No  No  No     Oxygen   Oxygen  Continuous  Continuous  Continuous  Continuous  Continuous   Liters  '3  3  3  3  3     ' NuStep   Level  -  '3  3  3  5   ' Minutes  -  '17  17  17  17   ' METs  -  2  2  1.9  2.1     Arm  Ergometer   Level  -  3  -  4  4   Minutes  -  17  -  17  17     Track   Laps  12  12  -  13  13   Minutes  17  17  -  17  17     Home Exercise Plan   Plans to continue exercise at  -  -  [employer] (comment) [employer]  -  -   Frequency  -  -  Add 3 additional days to program exercise sessions.  -  -   Row Name 08/31/17 1300 09/05/17 1200 09/07/17 1240 09/19/17 1227 09/21/17 1200     Response to Exercise   Blood Pressure (Admit)  130/70  104/60  100/66  116/70  126/60   Blood Pressure (Exercise)  150/80  120/80  132/70  110/60  114/70   Blood Pressure (Exit)  134/72  100/76  110/66  114/60  130/58   Heart Rate (Admit)  70 bpm  75 bpm  96 bpm  72 bpm  92 bpm   Heart Rate (Exercise)  89 bpm  91 bpm  118 bpm  86 bpm  112 bpm   Heart Rate (Exit)  72 bpm  71 bpm  100 bpm  73 bpm  97 bpm   Oxygen Saturation (Admit)  98 %  97 %  99 %  100 %  97 %   Oxygen Saturation (Exercise)  98 %  96 %  96 %  97 %  93 %   Oxygen Saturation (Exit)  100 %  97 %  100 %  99 %  97 %   Rating of Perceived Exertion (Exercise)  '13  13  13  13  15   ' Perceived Dyspnea (Exercise)  '2  1  2  2  3   ' Duration   Progress to 45 minutes of aerobic exercise without signs/symptoms of physical distress  Progress to 45 minutes of aerobic exercise without signs/symptoms of physical distress  Progress to 45 minutes of aerobic exercise without signs/symptoms of physical distress  Progress to 45 minutes of aerobic exercise without signs/symptoms of physical distress  Progress to 45 minutes of aerobic exercise without signs/symptoms of physical distress   Intensity  THRR unchanged  THRR unchanged  THRR unchanged  THRR unchanged  THRR unchanged     Progression   Progression  Continue to progress workloads to maintain intensity without signs/symptoms of physical distress.  Continue to progress workloads to maintain intensity without signs/symptoms of physical distress.  Continue to progress workloads to maintain intensity without signs/symptoms of physical distress.  Continue to progress workloads to maintain intensity without signs/symptoms of physical distress.  Continue to progress workloads to maintain intensity without signs/symptoms of physical distress.     Resistance Training   Training Prescription  Yes  Yes  Yes  Yes  Yes   Weight  orange bands  orange bands  orange bands  orange bands  orange bands   Reps  10-15  10-15  10-15  10-15  10-15   Time  10 Minutes  10 Minutes  10 Minutes  10 Minutes  10 Minutes     Interval Training   Interval Training  No  No  No  No  No     Oxygen   Oxygen  Continuous  Continuous  Continuous  Continuous  Continuous   Liters  3  3  3  3  3     NuStep   Level  '5  5  5  5  ' -   Minutes  '17  17  17  17  ' -   METs  1.8  2.2  2.3  1.5  -     Arm Ergometer   Level  4  4  -  4  4   Minutes  17  17  -  17  17     Track   Laps  -  '12  10  5  12   ' Minutes  -  '17  17  17  17      ' Exercise Comments: Exercise Comments    Row Name 08/17/17 1308           Exercise Comments  Home exercise completed          Exercise Goals and Review: Exercise Goals    Row Name 06/26/17  1103             Exercise Goals   Increase Physical Activity  Yes       Intervention  Provide advice, education, support and counseling about physical activity/exercise needs.;Develop an individualized exercise prescription for aerobic and resistive training based on initial evaluation findings, risk stratification, comorbidities and participant's personal goals.       Expected Outcomes  Achievement of increased cardiorespiratory fitness and enhanced flexibility, muscular endurance and strength shown through measurements of functional capacity and personal statement of participant.       Increase Strength and Stamina  Yes       Intervention  Provide advice, education, support and counseling about physical activity/exercise needs.;Develop an individualized exercise prescription for aerobic and resistive training based on initial evaluation findings, risk stratification, comorbidities and participant's personal goals.       Expected Outcomes  Achievement of increased cardiorespiratory fitness and enhanced flexibility, muscular endurance and strength shown through measurements of functional capacity and personal statement of participant.       Able to understand and use rate of perceived exertion (RPE) scale  Yes       Intervention  Provide education and explanation on how to use RPE scale       Expected Outcomes  Short Term: Able to use RPE daily in rehab to express subjective intensity level;Long Term:  Able to use RPE to guide intensity level when exercising independently       Able to understand and use Dyspnea scale  Yes       Intervention  Provide education and explanation on how to use Dyspnea scale       Expected Outcomes  Short Term: Able to use Dyspnea scale daily in rehab to express subjective sense of shortness of breath during exertion;Long Term: Able to use Dyspnea scale to guide intensity level when exercising independently       Knowledge and understanding of Target Heart Rate Range  (THRR)  Yes       Intervention  Provide education and explanation of THRR including how the numbers were predicted and where they are located for reference       Expected Outcomes  Short Term: Able to state/look up THRR;Long Term: Able to use THRR to govern intensity when exercising independently;Short Term: Able to use daily as guideline for intensity in rehab       Understanding of Exercise Prescription  Yes       Intervention  Provide education, explanation, and written materials on patient's individual  exercise prescription       Expected Outcomes  Short Term: Able to explain program exercise prescription;Long Term: Able to explain home exercise prescription to exercise independently          Exercise Goals Re-Evaluation : Exercise Goals Re-Evaluation    Row Name 07/24/17 0727 08/17/17 0959 09/05/17 0733 09/21/17 0758       Exercise Goal Re-Evaluation   Exercise Goals Review  Increase Strength and Stamina;Increase Physical Activity;Able to understand and use rate of perceived exertion (RPE) scale;Able to understand and use Dyspnea scale;Knowledge and understanding of Target Heart Rate Range (THRR);Understanding of Exercise Prescription  Increase Strength and Stamina;Increase Physical Activity;Able to understand and use Dyspnea scale;Able to understand and use rate of perceived exertion (RPE) scale;Knowledge and understanding of Target Heart Rate Range (THRR);Understanding of Exercise Prescription  Increase Strength and Stamina;Increase Physical Activity;Able to understand and use Dyspnea scale;Able to understand and use rate of perceived exertion (RPE) scale;Knowledge and understanding of Target Heart Rate Range (THRR);Understanding of Exercise Prescription  Increase Strength and Stamina;Increase Physical Activity;Able to understand and use Dyspnea scale;Able to understand and use rate of perceived exertion (RPE) scale;Knowledge and understanding of Target Heart Rate Range (THRR);Understanding of  Exercise Prescription    Comments  Patient is slowly progressing. Shortness of breath really limits her. Has only attended 6 sessions. Will cont. to monitor and progress as able.   Patient is slowly progressing. Shortness of breath really limits her. Has had a small set back due to a cold. Will discuss extending out the program so she can make more progress. Will cont. to monitor and progress as able.   Patient is progressing slow and steady in program. Patient is able to walk up to 13 laps in 15 minutes (221f each). Patient is being evaluated for a POC, this will make it easier for her to preform ADL's with the oxygen. Will cont. to monitor and progress as able.   Patient is progressing slow and steady in program. Patient is able to walk up to 15 laps in 15 minutes ([id number]). Patient is being evaluated for a POC, this will make it easier for her to preform ADL's with the oxygen. Will cont. to monitor and progress as able.     Expected Outcomes  Through exercise at rehab and at home, patient will increase strength and stamina and decrease shortness of breath.   Through exercise at rehab and at home, patient will increase strength and stamina and also be able to perform ADL's easier.  Through exercise at rehab and at home, patient will increase strength and stamina and understand how to safely exercise at home.   Through exercise at rehab and at home, patient will increase strength and stamina making ADL's easier to perform. Patient will also have a better understanding of safe exercise and what they are capable to do outside of clinical supervision.       Discharge Exercise Prescription (Final Exercise Prescription Changes): Exercise Prescription Changes - 09/21/17 1200      Response to Exercise   Blood Pressure (Admit)  126/60    Blood Pressure (Exercise)  114/70    Blood Pressure (Exit)  130/58    Heart Rate (Admit)  92 bpm    Heart Rate (Exercise)  112 bpm    Heart Rate (Exit)  97 bpm     Oxygen Saturation (Admit)  97 %    Oxygen Saturation (Exercise)  93 %    Oxygen Saturation (Exit)  97 %  Rating of Perceived Exertion (Exercise)  15    Perceived Dyspnea (Exercise)  3    Duration  Progress to 45 minutes of aerobic exercise without signs/symptoms of physical distress    Intensity  THRR unchanged      Progression   Progression  Continue to progress workloads to maintain intensity without signs/symptoms of physical distress.      Resistance Training   Training Prescription  Yes    Weight  orange bands    Reps  10-15    Time  10 Minutes      Interval Training   Interval Training  No      Oxygen   Oxygen  Continuous    Liters  3      Arm Ergometer   Level  4    Minutes  17      Track   Laps  12    Minutes  17       Nutrition:  Target Goals: Understanding of nutrition guidelines, daily intake of sodium <1562m, cholesterol <208m calories 30% from fat and 7% or less from saturated fats, daily to have 5 or more servings of fruits and vegetables.  Biometrics: Pre Biometrics - 06/26/17 1118      Pre Biometrics   Grip Strength  18 kg        Nutrition Therapy Plan and Nutrition Goals:   Nutrition Discharge: Rate Your Plate Scores: Nutrition Assessments - 07/13/17 1221      Rate Your Plate Scores   Pre Score  44       Nutrition Goals Re-Evaluation:   Nutrition Goals Discharge (Final Nutrition Goals Re-Evaluation):   Psychosocial: Target Goals: Acknowledge presence or absence of significant depression and/or stress, maximize coping skills, provide positive support system. Participant is able to verbalize types and ability to use techniques and skills needed for reducing stress and depression.  Initial Review & Psychosocial Screening: Initial Psych Review & Screening - 06/26/17 1116      Initial Review   Current issues with  None Identified      Family Dynamics   Good Support System?  Yes      Barriers   Psychosocial barriers to  participate in program  There are no identifiable barriers or psychosocial needs.      Screening Interventions   Interventions  Encouraged to exercise       Quality of Life Scores:   PHQ-9: Recent Review Flowsheet Data    [facility] 06/26/2017 01/25/2016   Decreased Interest 0 0   Down, Depressed, Hopeless 0 0   PHQ - 2 Score 0 0   Altered sleeping 2 -   Tired, decreased energy 1 -   Change in appetite 0 -   Feeling bad or failure about yourself  0 -   Trouble concentrating 0 -   Moving slowly or fidgety/restless 0 -   Suicidal thoughts 0 -   PHQ-9 Score 3 -     Interpretation of Total Score  Total Score Depression Severity:  1-4 = Minimal depression, 5-9 = Mild depression, 10-14 = Moderate depression, 15-19 = Moderately severe depression, 20-27 = Severe depression   Psychosocial Evaluation and Intervention: Psychosocial Evaluation - 06/26/17 1116      Psychosocial Evaluation & Interventions   Interventions  Encouraged to exercise with the program and follow exercise prescription    Expected Outcomes  patient will remain free from psychosocial barriers to particitpation    Continue Psychosocial Services   No  Follow up required       Psychosocial Re-Evaluation: Psychosocial Re-Evaluation    [name] Name 07/23/17 2151 08/14/17 1538 09/05/17 0745 09/28/17 3832       Psychosocial Re-Evaluation   Current issues with  None Identified  None Identified  None Identified  None Identified    Expected Outcomes  patient will remain free from psychosocial barriers to participation in pulmonary rehab  patient will remain free from psychosocial barriers to participation in pulmonary rehab  patient will remain free from psychosocial barriers to participation in pulmonary rehab  patient will remain free from psychosocial barriers to participation in pulmonary rehab    Interventions  Encouraged to attend Pulmonary Rehabilitation for the exercise  Encouraged to attend Pulmonary  Rehabilitation for the exercise  Encouraged to attend Pulmonary Rehabilitation for the exercise  Encouraged to attend Pulmonary Rehabilitation for the exercise    Continue Psychosocial Services   No Follow up required  No Follow up required  No Follow up required  No Follow up required       Psychosocial Discharge (Final Psychosocial Re-Evaluation): Psychosocial Re-Evaluation - 09/28/17 0649      Psychosocial Re-Evaluation   Current issues with  None Identified    Expected Outcomes  patient will remain free from psychosocial barriers to participation in pulmonary rehab    Interventions  Encouraged to attend Pulmonary Rehabilitation for the exercise    Continue Psychosocial Services   No Follow up required       Education: Education Goals: Education classes will be provided on a weekly basis, covering required topics. Participant will state understanding/return demonstration of topics presented.  Learning Barriers/Preferences: Learning Barriers/Preferences - 06/26/17 1115      Learning Barriers/Preferences   Learning Barriers  None    Learning Preferences  Computer/Internet;Individual Instruction;Verbal Instruction;Written Material       Education Topics: Risk Factor Reduction:  -Group instruction that is supported by a PowerPoint presentation. Instructor discusses the definition of a risk factor, different risk factors for pulmonary disease, and how the heart and lungs work together.     Nutrition for Pulmonary Patient:  -Group instruction provided by PowerPoint slides, verbal discussion, and written materials to support subject matter. The instructor gives an explanation and review of healthy diet recommendations, which includes a discussion on weight management, recommendations for fruit and vegetable consumption, as well as protein, fluid, caffeine, fiber, sodium, sugar, and alcohol. Tips for eating when patients are short of breath are discussed.   PULMONARY REHAB OTHER  RESPIRATORY from 09/21/2017 in [place]  Date  07/20/17  Educator  edna  Instruction Review Code  2- meets goals/outcomes      Pursed Lip Breathing:  -Group instruction that is supported by demonstration and informational handouts. Instructor discusses the benefits of pursed lip and diaphragmatic breathing and detailed demonstration on how to preform both.     Oxygen Safety:  -Group instruction provided by PowerPoint, verbal discussion, and written material to support subject matter. There is an overview of "What is Oxygen" and "Why do we need it".  Instructor also reviews how to create a safe environment for oxygen use, the importance of using oxygen as prescribed, and the risks of noncompliance. There is a brief discussion on traveling with oxygen and resources the patient may utilize.   PULMONARY REHAB OTHER RESPIRATORY from 09/21/2017 in [place]  Date  07/27/17  Educator  Dany Walther  Instruction Review Code  2- meets goals/outcomes  Oxygen Equipment:  -Group instruction provided by [facility] Staff utilizing handouts, written materials, and equipment demonstrations.   PULMONARY REHAB OTHER RESPIRATORY from 09/21/2017 in [place]  Date  08/31/17  Educator  Ace Gins  Instruction Review Code  2- meets goals/outcomes      Signs and Symptoms:  -Group instruction provided by written material and verbal discussion to support subject matter. Warning signs and symptoms of infection, stroke, and heart attack are reviewed and when to call the physician/911 reinforced. Tips for preventing the spread of infection discussed.   Advanced Directives:  -Group instruction provided by verbal instruction and written material to support subject matter. Instructor reviews Advanced Directive laws and proper instruction for filling out document.   Pulmonary Video:  -Group video education that  reviews the importance of medication and oxygen compliance, exercise, good nutrition, pulmonary hygiene, and pursed lip and diaphragmatic breathing for the pulmonary patient.   Exercise for the Pulmonary Patient:  -Group instruction that is supported by a PowerPoint presentation. Instructor discusses benefits of exercise, core components of exercise, frequency, duration, and intensity of an exercise routine, importance of utilizing pulse oximetry during exercise, safety while exercising, and options of places to exercise outside of rehab.     PULMONARY REHAB OTHER RESPIRATORY from 09/21/2017 in [place]  Date  08/17/17  Educator  Cloyde Reams  Instruction Review Code  2- meets goals/outcomes      Pulmonary Medications:  -Verbally interactive group education provided by instructor with focus on inhaled medications and proper administration.   Anatomy and Physiology of the Respiratory System and Intimacy:  -Group instruction provided by PowerPoint, verbal discussion, and written material to support subject matter. Instructor reviews respiratory cycle and anatomical components of the respiratory system and their functions. Instructor also reviews differences in obstructive and restrictive respiratory diseases with examples of each. Intimacy, Sex, and Sexuality differences are reviewed with a discussion on how relationships can change when diagnosed with pulmonary disease. Common sexual concerns are reviewed.   PULMONARY REHAB OTHER RESPIRATORY from 09/21/2017 in [place]  Date  09/21/17  Educator  RN  Instruction Review Code  2- meets goals/outcomes      MD DAY -A group question and answer session with a medical doctor that allows participants to ask questions that relate to their pulmonary disease state.   PULMONARY REHAB OTHER RESPIRATORY from 09/21/2017 in [place]  Date  07/11/17  Educator   [email or web address]  Instruction Review Code  2- meets goals/outcomes      OTHER EDUCATION -Group or individual verbal, written, or video instructions that support the educational goals of the pulmonary rehab program.   PULMONARY REHAB OTHER RESPIRATORY from 09/21/2017 in [place]  Date  08/10/17 [[employer] [facility]]  Educator  RD  Instruction Review Code  1- Verbalizes Understanding      Knowledge Questionnaire Score:   Core Components/Risk Factors/Patient Goals at Admission: Personal Goals and Risk Factors at Admission - 06/26/17 1115      Core Components/Risk Factors/Patient Goals on Admission   Improve shortness of breath with ADL's  Yes    Intervention  Provide education, individualized exercise plan and daily activity instruction to help decrease symptoms of SOB with activities of daily living.    Expected Outcomes  Short Term: Achieves a reduction of symptoms when performing activities of daily living.    Develop more efficient breathing techniques such as  purse lipped breathing and diaphragmatic breathing; and practicing self-pacing with activity  Yes    Intervention  Provide education, demonstration and support about specific breathing techniuqes utilized for more efficient breathing. Include techniques such as pursed lipped breathing, diaphragmatic breathing and self-pacing activity.    Expected Outcomes  Short Term: Participant will be able to demonstrate and use breathing techniques as needed throughout daily activities.       Core Components/Risk Factors/Patient Goals Review:  Goals and Risk Factor Review    Row Name 07/23/17 2149 08/14/17 1536 09/05/17 0744 09/28/17 0648       Core Components/Risk Factors/Patient Goals Review   Personal Goals Review  Develop more efficient breathing techniques such as purse lipped breathing and diaphragmatic breathing and practicing self-pacing with activity.;Improve shortness of breath with ADL's  Develop more  efficient breathing techniques such as purse lipped breathing and diaphragmatic breathing and practicing self-pacing with activity.;Improve shortness of breath with ADL's  Develop more efficient breathing techniques such as purse lipped breathing and diaphragmatic breathing and practicing self-pacing with activity.;Improve shortness of breath with ADL's  Develop more efficient breathing techniques such as purse lipped breathing and diaphragmatic breathing and practicing self-pacing with activity.;Improve shortness of breath with ADL's    Review  patient has attended 6 session since admission. She is making slow and steady progress towards goals. she is observed utilizing PLB technique correctly without cueing. She states she is beginning to see slight improvements in her shortness of breath when she ambulates.  patient doing well in pulmonary rehab. She is tolerating workload increases and feel she is getting stronger. her stamina and strength is improving and her shortness of breath with ADLs is improving with the use of PLB.  patient continues to do well and work hard in pulmonary rehab. She is progressing nicely towards her goals and her attendance is 90%. She consistantly utilizes PLB with exertion in class and at home which eases her shortness of breath with exertion. She is excited about being evaluated for a POC as she feels this will allow her to be more independent and mobil which will make her more compliant with her home exercise prescription.  Patient continues to progress in pulmonary rehab. She states her shortness of breath has improved with her ADLs and she credits this with utilizing PLB and a routein exercise program.    Expected Outcomes  see admission outcomes  see admission outcomes  see admission outcomes  see admission outcomes       Core Components/Risk Factors/Patient Goals at Discharge (Final Review):  Goals and Risk Factor Review - 09/28/17 0648      Core Components/Risk  Factors/Patient Goals Review   Personal Goals Review  Develop more efficient breathing techniques such as purse lipped breathing and diaphragmatic breathing and practicing self-pacing with activity.;Improve shortness of breath with ADL's    Review  Patient continues to progress in pulmonary rehab. She states her shortness of breath has improved with her ADLs and she credits this with utilizing PLB and a routein exercise program.    Expected Outcomes  see admission outcomes       ITP Comments:   Comments: Patient has attended 20 sessions since admission to pulmonary rehab.

## 2017-09-28 NOTE — Progress Notes (Signed)
Daily Session Note  Patient Details  Name: [name] MRN: [id number] Date of Birth: 09/26/1951 Referring Provider:     Pulmonary Rehab Walk Test from 06/27/2017 in [place]  Referring Provider  Dr. Melvyn Novas      Encounter Date: 09/28/2017  Check In: Session Check In - 09/28/17 1030      Check-In   Location  [facility]    Staff Present  Rosebud Poles, RN, BSN;Lisa Ysidro Evert, RN;Portia Rollene Rotunda, RN, BSN    Supervising physician immediately available to respond to emergencies  [facility] immediately available    Physician(s)  Dr. Rockne Menghini    Medication changes reported      No    Fall or balance concerns reported     No    Tobacco Cessation  No Change    Warm-up and Cool-down  Performed as group-led instruction    Resistance Training Performed  Yes    VAD Patient?  No      Pain Assessment   Currently in Pain?  No/denies    Multiple Pain Sites  No       Capillary Blood Glucose: No results found for this or any previous visit (from the past 24 hour(s)).  Exercise Prescription Changes - 09/28/17 1200      Response to Exercise   Blood Pressure (Admit)  110/60    Blood Pressure (Exercise)  126/70    Blood Pressure (Exit)  136/70    Heart Rate (Admit)  80 bpm    Heart Rate (Exercise)  92 bpm    Heart Rate (Exit)  77 bpm    Oxygen Saturation (Admit)  99 %    Oxygen Saturation (Exercise)  96 %    Oxygen Saturation (Exit)  100 %    Rating of Perceived Exertion (Exercise)  11    Perceived Dyspnea (Exercise)  2    Duration  Progress to 45 minutes of aerobic exercise without signs/symptoms of physical distress    Intensity  THRR unchanged      Progression   Progression  Continue to progress workloads to maintain intensity without signs/symptoms of physical distress.      Resistance Training   Training Prescription  Yes    Weight  orange bands    Reps  10-15    Time  10 Minutes      Interval Training   Interval Training   No      Oxygen   Oxygen  Continuous    Liters  3      NuStep   Level  5    Minutes  17    METs  2.4      Arm Ergometer   Level  4    Minutes  17      Track   Minutes  17       Social History   Tobacco Use  Smoking Status Former Smoker  . Packs/day: 0.50  . Years: 20.00  . Pack years: 10.00  . Types: Cigarettes  . Last attempt to quit: 11/03/2008  . Years since quitting: 8.9  Smokeless Tobacco Never Used    Goals Met:  Exercise tolerated well Strength training completed today  Goals Unmet:  Not Applicable  Comments: Service time is from 1030 to 1215    Dr. Rush Farmer is Medical Director for Pulmonary Rehab at [facility].

## 2017-10-05 ENCOUNTER — Encounter (HOSPITAL_COMMUNITY)
Admission: RE | Admit: 2017-10-05 | Discharge: 2017-10-05 | Disposition: A | Discharge status: Home / Self Care | Payer: Medicare Other | Source: Ambulatory Visit | Attending: Internal Medicine | Admitting: Internal Medicine

## 2017-10-05 VITALS — Wt 109.1 lb

## 2017-10-05 DIAGNOSIS — J9611 Chronic respiratory failure with hypoxia: Secondary | ICD-10-CM | POA: Insufficient documentation

## 2017-10-05 NOTE — Progress Notes (Signed)
Daily Session Note  Patient Details  Name: Jeanet Lupe MRN: [id number] Date of Birth: [birth year] Referring Provider:     Pulmonary Rehab Walk Test from 06/27/2017 in [place]  Referring Provider  Dr. Melvyn Novas      Encounter Date: 10/05/2017  Check In: Session Check In - 10/05/17 1115      Check-In   Location  [facility]    Staff Present  Rodney Langton, RN;Deserea Bordley Rollene Rotunda, RN, BSN;Molly diVincenzo, MS, ACSM RCEP, Exercise Physiologist;Joan Leonia Reeves, RN, BSN    Supervising physician immediately available to respond to emergencies  [facility] immediately available    Physician(s)  Dr. Eliseo Squires    Medication changes reported      No    Fall or balance concerns reported     No    Tobacco Cessation  No Change    Warm-up and Cool-down  Performed as group-led instruction    Resistance Training Performed  Yes    VAD Patient?  No      Pain Assessment   Currently in Pain?  No/denies    Multiple Pain Sites  No       Capillary Blood Glucose: No results found for this or any previous visit (from the past 24 hour(s)).  Exercise Prescription Changes - 10/05/17 1324      Response to Exercise   Blood Pressure (Admit)  104/60    Blood Pressure (Exercise)  118/64    Blood Pressure (Exit)  130/64    Heart Rate (Admit)  84 bpm    Heart Rate (Exercise)  104 bpm    Heart Rate (Exit)  79 bpm    Oxygen Saturation (Admit)  92 %    Oxygen Saturation (Exercise)  91 %    Oxygen Saturation (Exit)  99 %    Rating of Perceived Exertion (Exercise)  9    Perceived Dyspnea (Exercise)  1    Duration  Progress to 45 minutes of aerobic exercise without signs/symptoms of physical distress    Intensity  THRR unchanged      Progression   Progression  Continue to progress workloads to maintain intensity without signs/symptoms of physical distress.      Resistance Training   Training Prescription  Yes    Weight  orange bands    Reps  10-15    Time  10  Minutes      Interval Training   Interval Training  No      Oxygen   Oxygen  Continuous    Liters  3      Arm Ergometer   Level  4    Minutes  17      Track   Laps  11    Minutes  17       Social History   Tobacco Use  Smoking Status Former Smoker  . Packs/day: 0.50  . Years: 20.00  . Pack years: 10.00  . Types: Cigarettes  . Last attempt to quit: 11/03/2008  . Years since quitting: 8.9  Smokeless Tobacco Never Used    Goals Met:  Improved SOB with ADL's Using PLB without cueing & demonstrates good technique Exercise tolerated well No report of cardiac concerns or symptoms Strength training completed today  Goals Unmet:  Not Applicable  Comments: Service time is from 1030 to [place]   Dr. Rush Farmer is Medical Director for Pulmonary Rehab at [facility].

## 2017-10-10 ENCOUNTER — Encounter (HOSPITAL_COMMUNITY)
Admission: RE | Admit: 2017-10-10 | Discharge: 2017-10-10 | Disposition: A | Discharge status: Home / Self Care | Payer: Medicare Other | Source: Ambulatory Visit | Attending: Internal Medicine | Admitting: Internal Medicine

## 2017-10-10 VITALS — Wt 109.1 lb

## 2017-10-10 DIAGNOSIS — J9611 Chronic respiratory failure with hypoxia: Secondary | ICD-10-CM

## 2017-10-10 NOTE — Progress Notes (Signed)
Daily Session Note  Patient Details  Name: [name] MRN: [id number] Date of Birth: 05-26-1951 Referring Provider:     Pulmonary Rehab Walk Test from 06/27/2017 in [place]  Referring Provider  Dr. Melvyn Novas      Encounter Date: 10/10/2017  Check In: Session Check In - 10/10/17 1030      Check-In   Location  [facility]    Staff Present  Rosebud Poles, RN, Luisa Hart, RN, BSN;Molly diVincenzo, MS, ACSM RCEP, Exercise Physiologist;Scarlett Portlock Ysidro Evert, RN    Supervising physician immediately available to respond to emergencies  [facility] immediately available    Physician(s)  Dr. Eliseo Squires    Medication changes reported      No    Fall or balance concerns reported     No    Tobacco Cessation  No Change    Warm-up and Cool-down  Performed as group-led instruction    Resistance Training Performed  Yes    VAD Patient?  No      Pain Assessment   Currently in Pain?  No/denies    Multiple Pain Sites  No       Capillary Blood Glucose: No results found for this or any previous visit (from the past 24 hour(s)).  Exercise Prescription Changes - 10/10/17 1400      Response to Exercise   Blood Pressure (Admit)  108/60    Blood Pressure (Exercise)  146/70    Blood Pressure (Exit)  138/72    Heart Rate (Admit)  87 bpm    Heart Rate (Exercise)  108 bpm    Heart Rate (Exit)  86 bpm    Oxygen Saturation (Admit)  95 %    Oxygen Saturation (Exercise)  94 %    Oxygen Saturation (Exit)  100 %    Rating of Perceived Exertion (Exercise)  13    Perceived Dyspnea (Exercise)  2    Duration  Progress to 45 minutes of aerobic exercise without signs/symptoms of physical distress    Intensity  THRR unchanged      Progression   Progression  Continue to progress workloads to maintain intensity without signs/symptoms of physical distress.      Resistance Training   Training Prescription  Yes    Weight  orange bands    Reps  10-15    Time  10  Minutes      Interval Training   Interval Training  No      Oxygen   Oxygen  Continuous    Liters  3      NuStep   Level  5    Minutes  17    METs  2      Arm Ergometer   Level  4    Minutes  17      Track   Laps  12    Minutes  17       Social History   Tobacco Use  Smoking Status Former Smoker  . Packs/day: 0.50  . Years: 20.00  . Pack years: 10.00  . Types: Cigarettes  . Last attempt to quit: 11/03/2008  . Years since quitting: 8.9  Smokeless Tobacco Never Used    Goals Met:  Exercise tolerated well No report of cardiac concerns or symptoms Strength training completed today  Goals Unmet:  Not Applicable  Comments: Service time is from 1030 to 1205    Dr. Rush Farmer is Medical Director for Pulmonary Rehab at [facility]  [facility].

## 2017-10-12 ENCOUNTER — Encounter (HOSPITAL_COMMUNITY)
Admission: RE | Admit: 2017-10-12 | Discharge: 2017-10-12 | Disposition: A | Discharge status: Home / Self Care | Payer: Medicare Other | Source: Ambulatory Visit | Attending: Internal Medicine | Admitting: Internal Medicine

## 2017-10-12 DIAGNOSIS — J9611 Chronic respiratory failure with hypoxia: Secondary | ICD-10-CM

## 2017-10-17 ENCOUNTER — Encounter (HOSPITAL_COMMUNITY): Payer: Medicare Other

## 2017-10-19 ENCOUNTER — Encounter (HOSPITAL_COMMUNITY): Payer: Medicare Other

## 2017-10-19 ENCOUNTER — Telehealth: Payer: Self-pay | Admitting: Pharmacy Technician

## 2017-10-19 NOTE — Telephone Encounter (Signed)
Sorry she wanted to know if we received the CMN and the chart request.

## 2017-10-19 NOTE — Telephone Encounter (Signed)
[name], can you please clarify on your message.  Thanks!

## 2017-10-20 NOTE — Telephone Encounter (Signed)
PCCs please advise if you've received a CMN on this patient.  Thanks.

## 2017-10-20 NOTE — Telephone Encounter (Signed)
This form was signed by Dr. Melvyn Novas yesterday 01/17 and I faxed it this morning 10/20/2017

## 2017-10-27 ENCOUNTER — Ambulatory Visit: Payer: Medicare Other | Admitting: Internal Medicine

## 2017-10-27 ENCOUNTER — Encounter (HOSPITAL_COMMUNITY): Payer: Self-pay

## 2017-10-27 DIAGNOSIS — J9611 Chronic respiratory failure with hypoxia: Secondary | ICD-10-CM

## 2017-10-27 NOTE — Progress Notes (Signed)
Discharge Progress Report  Patient Details  Name: [name] MRN: [id number] Date of Birth: 09-Mar-1951 Referring Provider:     Pulmonary Rehab Walk Test from 06/27/2017 in [place]  Referring Provider  Dr. Melvyn Novas       Number of Visits: 24  Reason for Discharge:  Patient reached a stable level of exercise. Patient independent in their exercise. Patient has met program and personal goals.  Smoking History:  Social History   Tobacco Use  Smoking Status Former Smoker  . Packs/day: 0.50  . Years: 20.00  . Pack years: 10.00  . Types: Cigarettes  . Last attempt to quit: 11/03/2008  . Years since quitting: 8.9  Smokeless Tobacco Never Used    Diagnosis:  Chronic respiratory failure with hypoxia (HCC)  ADL UCSD: Pulmonary Assessment Scores    Row Name 10/12/17 1619         ADL UCSD   ADL Phase  Exit     SOB Score total  9       CAT Score   CAT Score  9 Exit        Initial Exercise Prescription:   Discharge Exercise Prescription (Final Exercise Prescription Changes): Exercise Prescription Changes - 10/10/17 1400      Response to Exercise   Blood Pressure (Admit)  108/60    Blood Pressure (Exercise)  146/70    Blood Pressure (Exit)  138/72    Heart Rate (Admit)  87 bpm    Heart Rate (Exercise)  108 bpm    Heart Rate (Exit)  86 bpm    Oxygen Saturation (Admit)  95 %    Oxygen Saturation (Exercise)  94 %    Oxygen Saturation (Exit)  100 %    Rating of Perceived Exertion (Exercise)  13    Perceived Dyspnea (Exercise)  2    Duration  Progress to 45 minutes of aerobic exercise without signs/symptoms of physical distress    Intensity  THRR unchanged      Progression   Progression  Continue to progress workloads to maintain intensity without signs/symptoms of physical distress.      Resistance Training   Training Prescription  Yes    Weight  orange bands    Reps  10-15    Time  10 Minutes      Interval Training   Interval Training  No      Oxygen   Oxygen  Continuous    Liters  3      NuStep   Level  5    Minutes  17    METs  2      Arm Ergometer   Level  4    Minutes  17      Track   Laps  12    Minutes  17       Functional Capacity: 6 Minute Walk    Row Name 10/12/17 1341         6 Minute Walk   Phase  Discharge     Distance  1300 feet     Distance Feet Change  400 ft     Walk Time  6 minutes     # of Rest Breaks  0     MPH  2.46     METS  2.91     RPE  14     Perceived Dyspnea   3     Symptoms  No     Resting HR  75  bpm     Resting BP  120/70     Resting Oxygen Saturation   99 %     Exercise Oxygen Saturation  during 6 min walk  92 %     Max Ex. HR  107 bpm     Max Ex. BP  184/92     2 Minute Post BP  130/84       Interval HR   1 Minute HR  93     2 Minute HR  93     3 Minute HR  95     4 Minute HR  107     5 Minute HR  107     6 Minute HR  107     2 Minute Post HR  94     Interval Heart Rate?  Yes       Interval Oxygen   Interval Oxygen?  Yes     Baseline Oxygen Saturation %  99 %     1 Minute Oxygen Saturation %  95 %     1 Minute Liters of Oxygen  3 L     2 Minute Oxygen Saturation %  95 %     2 Minute Liters of Oxygen  3 L     3 Minute Oxygen Saturation %  96 %     3 Minute Liters of Oxygen  3 L     4 Minute Oxygen Saturation %  95 %     4 Minute Liters of Oxygen  3 L     5 Minute Oxygen Saturation %  92 %     5 Minute Liters of Oxygen  3 L     6 Minute Oxygen Saturation %  92 %     6 Minute Liters of Oxygen  3 L     2 Minute Post Oxygen Saturation %  96 %     2 Minute Post Liters of Oxygen  3 L        Psychological, QOL, Others - Outcomes: PHQ 2/9: Depression screen [facility] 2/9 06/26/2017 01/25/2016  Decreased Interest 0 0  Down, Depressed, Hopeless 0 0  PHQ - 2 Score 0 0  Altered sleeping 2 -  Tired, decreased energy 1 -  Change in appetite 0 -  Feeling bad or failure about yourself  0 -  Trouble concentrating 0 -  Moving slowly or  fidgety/restless 0 -  Suicidal thoughts 0 -  PHQ-9 Score 3 -    Quality of Life:   Personal Goals: Goals established at orientation with interventions provided to work toward goal.    Personal Goals Discharge: Goals and Risk Factor Review    Row Name 09/05/17 0744 09/28/17 0648 10/27/17 1521         Core Components/Risk Factors/Patient Goals Review   Personal Goals Review  Develop more efficient breathing techniques such as purse lipped breathing and diaphragmatic breathing and practicing self-pacing with activity.;Improve shortness of breath with ADL's  Develop more efficient breathing techniques such as purse lipped breathing and diaphragmatic breathing and practicing self-pacing with activity.;Improve shortness of breath with ADL's  Develop more efficient breathing techniques such as purse lipped breathing and diaphragmatic breathing and practicing self-pacing with activity.;Improve shortness of breath with ADL's     Review  patient continues to do well and work hard in pulmonary rehab. She is progressing nicely towards her goals and her attendance is 90%. She consistantly utilizes PLB with exertion in class and at home which  eases her shortness of breath with exertion. She is excited about being evaluated for a POC as she feels this will allow her to be more independent and mobil which will make her more compliant with her home exercise prescription.  Patient continues to progress in pulmonary rehab. She states her shortness of breath has improved with her ADLs and she credits this with utilizing PLB and a routein exercise program.  patient met all goals at discharge     Expected Outcomes  see admission outcomes  see admission outcomes  -        Exercise Goals and Review:   Nutrition & Weight - Outcomes:    Nutrition: Nutrition Therapy & Goals - 10/27/17 1106      Nutrition Therapy   Diet  Heart Healthy      Personal Nutrition Goals   Nutrition Goal  Pt to identify and  limit food sources of sodium.    Personal Goal #2  The pt will recognize symptoms that can interfere with adequate oral intake.    Personal Goal #3  The pt will consume high-energy, nutrient-dense beverages when necessary to compensate for decreased oral intake of solid foods.      Intervention Plan   Intervention  Prescribe, educate and counsel regarding individualized specific dietary modifications aiming towards targeted core components such as weight, hypertension, lipid management, diabetes, heart failure and other comorbidities.    Expected Outcomes  Short Term Goal: Understand basic principles of dietary content, such as calories, fat, sodium, cholesterol and nutrients.;Long Term Goal: Adherence to prescribed nutrition plan.       Nutrition Discharge: Nutrition Assessments - 10/16/17 0825      Rate Your Plate Scores   Pre Score  44    Post Score  48       Education Questionnaire Score: Knowledge Questionnaire Score - 10/12/17 1619      Knowledge Questionnaire Score   Post Score  11/13

## 2017-12-08 DIAGNOSIS — I10 Essential (primary) hypertension: Secondary | ICD-10-CM | POA: Diagnosis not present

## 2017-12-08 DIAGNOSIS — E7800 Pure hypercholesterolemia, unspecified: Secondary | ICD-10-CM | POA: Diagnosis not present

## 2017-12-08 DIAGNOSIS — J449 Chronic obstructive pulmonary disease, unspecified: Secondary | ICD-10-CM | POA: Diagnosis not present

## 2017-12-08 DIAGNOSIS — E039 Hypothyroidism, unspecified: Secondary | ICD-10-CM | POA: Diagnosis not present

## 2017-12-08 DIAGNOSIS — R7303 Prediabetes: Secondary | ICD-10-CM | POA: Diagnosis not present

## 2017-12-08 DIAGNOSIS — M810 Age-related osteoporosis without current pathological fracture: Secondary | ICD-10-CM | POA: Diagnosis not present

## 2018-03-15 ENCOUNTER — Ambulatory Visit: Payer: Medicare Other | Admitting: Internal Medicine

## 2018-03-26 ENCOUNTER — Encounter: Payer: Self-pay | Admitting: Internal Medicine

## 2018-03-26 ENCOUNTER — Ambulatory Visit (INDEPENDENT_AMBULATORY_CARE_PROVIDER_SITE_OTHER): Payer: Medicare Other | Admitting: Internal Medicine

## 2018-03-26 VITALS — BP 122/76 | HR 92 | Ht 61.0 in | Wt 110.6 lb

## 2018-03-26 DIAGNOSIS — J449 Chronic obstructive pulmonary disease, unspecified: Secondary | ICD-10-CM | POA: Diagnosis not present

## 2018-03-26 DIAGNOSIS — J9611 Chronic respiratory failure with hypoxia: Secondary | ICD-10-CM | POA: Diagnosis not present

## 2018-03-26 NOTE — Patient Instructions (Addendum)
No change in medications or 02    Please schedule a follow up visit in about 6 months but call sooner if needed

## 2018-03-26 NOTE — Progress Notes (Signed)
Subjective:    Wall ID: [name], female    DOB: [birth year]   MRN: [id number]      Brief Wall profile:  29 yobf  Quit smoking 2010  with  GOLD III criteria copd / 0 2 dep   Significant tests/ events  She was seen by Dr [name] 12/24/08 after hospitalization for LUL pna. CT angio 3/10 showed centrilobular emphysema.She was placed on oxygen around this time [facility] pt) .  PFTs  12/2008 FEv1 39%, DLCO 35%, mild air trapping    History of Present Illness  08/28/2015 acute extended ov/[name] re: GOLD III copd/ on spiriva and freq saba and 02 2lpm hs/ 3lpm with activity  Chief Complaint  Wall presents with  . Acute Visit    Pt c/o increased SOB for the past few wks- "I have attacks"- relates to the weather change. She takes albuterol inhaler 2 x per day on average and uses neb 1-2 x per day.   baseline doe = MMRC2 = can't walk a nl pace on a flat grade s sob  >>Stiolto rx     02/08/2016 [name] Hospital follow up  Wall returns for a post hospital follow-up . Wall was admitted 01/17/2016 for COPD exacerbation with acute on chronic respiratory failure She did require initial BiPAP support. She was treated with IV antibiotics, steroids and nebulized bronchodilators. CT chest was negative for pulmonary embolism, showed emphysematous changes and a focal groundglass opacity in RML . Will need follow up CT in 3 months.  Since discharge. Wall is feeling better with less cough and dyspnea.  She is on Advair and Spiriva . Was previously on Stiolto . Wants to go back on Stiolto.  She denies any chest pain, orthopnea, PND, or increased leg swelling. On oxygen 2l/m rest and 3l/m w/ act.  Says her PCP has extended her on prednisone , unsure of dose.  She says she will bring bottle to next visit. Says her appetite is much better.  PVX and Prevnar utd.  rec Stop Advair and Spiriva  Restart Stiolto daily, rinse after use.  Set up CT chest in  2 months  Follow up Dr. Melvyn Wall   In 2 months (after CT chest )     03/31/2016 acute extended ov/[name] re: aecopd on anoro req by insurance   2lpm rest/3lpm  Chief Complaint  Wall presents with  . Acute Visit    Pt c/o increased DOE for the past 1-2 wks. She has been using neb at least once per day.  She also c/o facial puffiness and HA "I have had a head cold". She has had minimal cough with white sputum.     easily confused with details of care/ overusing neb and not using saba hfa   Not clear what the alternatives to anoro are. Doe = MMRC3 = can't walk 100 yards even at a slow pace at a flat grade s stopping due to sob  On 3lpm and uses w/c usually to go out.  rec Plan A = Automatic = Stiolto 2 each am  Plan B = Backup Only use your albuterol as a rescue medication  Plan C = Crisis - only use your albuterol nebulizer if you first try Plan B and it fails to help > ok to use the nebulizer up to every 4 hours but if start needing it regularly call for immediate appointment Plan D = Doctor - call me if B and C not adequate Plan E =  ER - go to ER or call 911 if all else fails   Prednisone 10 mg take  4 each am x 2 days,   2 each am x 2 days,  1 each am x 2 days and stop    04/13/2016  f/u ov/[name] re: copd III/ 02 3lpm / stiolto  Chief Complaint  Wall presents with  . Follow-up    breathing has not been the same since being out of hospital, more dependant on her oxygen now, hard to get out and do anything.    no change doe on 02 , much less need for  rescue now  Better p pred and no worse off it  Doe = MMRC4  = sob if tries to leave home or while getting dressed  rec Call for alternatives to Stiolto if your insurance won't  Pay enough for it to make it affordable      10/26/2016  f/u ov/[name] re:  Copd GOLD III/ 3lpm x use 2lpm hs/ stiolto Chief Complaint  Wall presents with  . Follow-up    Breathing has returned back to her normal baseline. She states she has a "slight cold"- has minimal cough with  greyish sputum.    sleeps ok / rare need for saba hfa/ never neb  Doe = MMRC3 = can't walk 100 yards even at a slow pace at a flat grade s stopping due to sob  Even on 02  rec Plan A = Automatic = Stiolto 2 each am  Plan B = Backup Only use your albuterol  Plan C = Crisis - only use your albuterol nebulizer if you first try Plan B     04/25/2017  f/u ov/[name] re: COPD GOLD III/  3lpm with ambulation/ 2lpm otherwise  Chief Complaint  Wall presents with  . Follow-up    Breathing is doing about the same. She denies any new co's. She has not used her albuterol inhaler or neb.   doe still MMRC 3  No need for saba Sleeping fine on 2lpm  Has never done pulmonary rehab  Rec No change in medications Work on maintaining optimal inhaler technique:    Please see Wall coordinator before you leave today  to schedule rehab referral Please schedule a follow up visit in 6 months but call sooner if needed with cxr on return      09/13/2017  f/u ov/[name] re: copd  GOLD III/ still in pulmonary rehab reliant on samples 3lpm with activity/ 2lpm  Chief Complaint  Wall presents with  . Follow-up    Pt states that she has been good since last visit. Pt was given sample of Bevespi but does not think she is getting enough effect out of the medication and wants to switch to a different inhaler. States that she has been going to rehab and feels she is becoming stronger. DME: [name], 3L with ambulation and 2L at rest.   never needs any albuterol in any form  Doe = MMRC2 = can't walk a nl pace on a flat grade s sob but does fine slow and flat on 02 pushing cart  Sleeping fine on 2lpm  Improved overall in rehab  Sleeping ok flat on 2lpm  without nocturnal  or early am exacerbation  of respiratory  c/o's or need for noct saba. Also denies any obvious fluctuation of symptoms with weather or environmental changes or other aggravating or alleviating factors except as outlined above      03/26/2018  f/u ov/[name] re:  GOLD III/ anoro and [id number]  Chief Complaint  Wall presents with  . Follow-up    Doing well at this time no problems at this time.   Dyspnea:  On 4lpm can do ht slow pace Cough: none  SABA use: none 02: 2lpm hs/ 4lpm Pulses otherwise     No obvious day to day or daytime variability or assoc excess/ purulent sputum or mucus plugs or hemoptysis or cp or chest tightness, subjective wheeze or overt sinus or hb symptoms.   Sleeping: ok on 2lpm flat without nocturnal  or early am exacerbation  of respiratory  c/o's or need for noct saba. Also denies any obvious fluctuation of symptoms with weather or environmental changes or other aggravating or alleviating factors except as outlined above   No unusual exposure hx or h/o childhood pna/ asthma or knowledge of premature birth.  Current Allergies, Complete Past Medical History, Past Surgical History, Family History, and Social History were reviewed in [employer] record.  ROS  The following are not active complaints unless bolded Hoarseness, sore throat, dysphagia, dental problems, itching, sneezing,  nasal congestion or discharge of excess mucus or purulent secretions, ear ache,   fever, chills, sweats, unintended wt loss or wt gain, classically pleuritic or exertional cp,  orthopnea pnd or arm/hand swelling  or leg swelling, presyncope, palpitations, abdominal pain, anorexia, nausea, vomiting, diarrhea  or change in bowel habits or change in bladder habits, change in stools or change in urine, dysuria, hematuria,  rash, arthralgias, visual complaints, headache, numbness, weakness or ataxia or problems with walking or coordination,  change in mood or  memory.        Current Meds  Medication Sig  . albuterol (PROVENTIL HFA;VENTOLIN HFA) 108 (90 BASE) MCG/ACT inhaler Inhale 2 puffs into the lungs every 4 (four) hours as needed for wheezing or shortness of breath.  Marland Kitchen albuterol (PROVENTIL) (2.5  MG/3ML) 0.083% nebulizer solution Take 3 mLs (2.5 mg total) by nebulization every 2 (two) hours as needed for wheezing or shortness of breath.  Marland Kitchen alendronate (FOSAMAX) 70 MG tablet TAKE 1 TABLET ONCE A WEEK  . aspirin EC 81 MG tablet Take 81 mg by mouth daily.  Marland Kitchen atenolol (TENORMIN) 25 MG tablet Take 25 mg by mouth daily.  . Cyanocobalamin (VITAMIN B-12 PO) Take 1 tablet by mouth daily.  Marland Kitchen guaiFENesin (MUCINEX) 600 MG 12 hr tablet Take 1 tablet (600 mg total) by mouth 2 (two) times daily.  Marland Kitchen levothyroxine (SYNTHROID, LEVOTHROID) 75 MCG tablet Take 75 mcg by mouth daily.  Marland Kitchen lovastatin (MEVACOR) 20 MG tablet Take 20 mg by mouth at bedtime.  . mirtazapine (REMERON) 15 MG tablet Take 15 mg by mouth at bedtime as needed. sleep  . Multiple Vitamin (MULTIVITAMIN WITH MINERALS) TABS tablet Take 1 tablet by mouth daily.  . OXYGEN Pt uses 2lpm with sleep and 3 lpm with exertion  DME- [facility]  . umeclidinium-vilanterol (ANORO ELLIPTA) 62.5-25 MCG/INH AEPB Only open the device one time= one click  and then take your two separate drags to be sure you get it all                    Objective:   Physical Exam    Pleasant elderly bf nad  03/26/2018       110  09/13/2017     111  04/25/2017      107 10/26/2016      105  04/13/2016  98   03/31/16 97 lb (43.999 kg)  02/08/16 94 lb (42.638 kg)  01/22/16 92 lb 3.2 oz (41.822 kg)      Vital signs reviewed - Note on arrival 02 sats  93% on  4lpm pulse            HEENT: nl dentition / oropharynx. Nl external ear canals without cough reflex - moderate bilateral non-specific turbinate edema  / edentulous   NECK :  without JVD/Nodes/TM/ nl carotid upstrokes bilaterally   LUNGS: no acc muscle use,  Mod barrel  contour chest wall with bilateral  Distant bs s audible wheeze and  without cough on insp or exp maneuver and mod   Hyperresonant  to  percussion bilaterally     CV:  RRR  no s3 or murmur or increase in P2, and no edema   ABD:  soft  and nontender with pos mid insp Hoover's  in the supine position. No bruits or organomegaly appreciated, bowel sounds nl  MS:   Nl gait/  ext warm without deformities, calf tenderness, cyanosis or clubbing No obvious joint restrictions   SKIN: warm and dry without lesions    NEURO:  alert, approp, nl sensorium with  no motor or cerebellar deficits apparent.                 Assessment & Plan:

## 2018-03-28 ENCOUNTER — Encounter: Payer: Self-pay | Admitting: Internal Medicine

## 2018-03-28 NOTE — Assessment & Plan Note (Signed)
PFTs  12/2008 FEv1 39%, DLCO 35%, mild air trapping  - 04/25/2017  After extensive coaching HFA effectiveness =    75% (short Ti) - Referred 04/25/2017 rehab - 06/10/2017 insurance won't cover stiolto > changed to bevespi 2bid but preferred anoro   03/26/2018  After extensive coaching inhaler device  effectiveness =    90% with elipta/ 75% with hfa   Adequate control on present rx, reviewed in detail with pt > no change in rx needed    I had an extended discussion with the patient reviewing all relevant studies completed to date and  lasting 110 minutes of a 15 minute visit    See device teaching which extended face to face time for this visit   Each maintenance medication was reviewed in detail including most importantly the difference between maintenance and prns and under what circumstances the prns are to be triggered using an action plan format that is not reflected in the computer generated alphabetically organized AVS.    Please see AVS for specific instructions unique to this visit that I personally wrote and verbalized to the the pt in detail and then reviewed with pt  by my nurse highlighting any  changes in therapy recommended at today's visit to their plan of care.

## 2018-03-28 NOTE — Assessment & Plan Note (Signed)
rx 2lpm rest/ 4lpm pulsed with activity as of 03/26/2018    Adequate control on present rx, reviewed in detail with pt > no change in rx needed

## 2018-06-19 DIAGNOSIS — I10 Essential (primary) hypertension: Secondary | ICD-10-CM | POA: Diagnosis not present

## 2018-06-19 DIAGNOSIS — R7303 Prediabetes: Secondary | ICD-10-CM | POA: Diagnosis not present

## 2018-06-19 DIAGNOSIS — E7800 Pure hypercholesterolemia, unspecified: Secondary | ICD-10-CM | POA: Diagnosis not present

## 2018-06-19 DIAGNOSIS — M810 Age-related osteoporosis without current pathological fracture: Secondary | ICD-10-CM | POA: Diagnosis not present

## 2018-06-19 DIAGNOSIS — Z1389 Encounter for screening for other disorder: Secondary | ICD-10-CM | POA: Diagnosis not present

## 2018-06-19 DIAGNOSIS — G4700 Insomnia, unspecified: Secondary | ICD-10-CM | POA: Diagnosis not present

## 2018-06-19 DIAGNOSIS — E039 Hypothyroidism, unspecified: Secondary | ICD-10-CM | POA: Diagnosis not present

## 2018-06-19 DIAGNOSIS — J449 Chronic obstructive pulmonary disease, unspecified: Secondary | ICD-10-CM | POA: Diagnosis not present

## 2018-06-19 DIAGNOSIS — Z23 Encounter for immunization: Secondary | ICD-10-CM | POA: Diagnosis not present

## 2018-06-19 DIAGNOSIS — Z0000 Encounter for general adult medical examination without abnormal findings: Secondary | ICD-10-CM | POA: Diagnosis not present

## 2018-08-23 ENCOUNTER — Ambulatory Visit
Admission: RE | Admit: 2018-08-23 | Discharge: 2018-08-23 | Disposition: A | Discharge status: Home / Self Care | Payer: Medicare Other | Source: Ambulatory Visit | Attending: Internal Medicine | Admitting: Internal Medicine

## 2018-08-23 ENCOUNTER — Other Ambulatory Visit: Payer: Self-pay | Admitting: Internal Medicine

## 2018-08-23 DIAGNOSIS — J441 Chronic obstructive pulmonary disease with (acute) exacerbation: Secondary | ICD-10-CM

## 2018-08-23 DIAGNOSIS — J90 Pleural effusion, not elsewhere classified: Secondary | ICD-10-CM | POA: Diagnosis not present

## 2018-09-10 DIAGNOSIS — J449 Chronic obstructive pulmonary disease, unspecified: Secondary | ICD-10-CM | POA: Diagnosis not present

## 2018-09-10 DIAGNOSIS — Z86010 Personal history of colonic polyps: Secondary | ICD-10-CM | POA: Diagnosis not present

## 2018-09-11 ENCOUNTER — Other Ambulatory Visit: Payer: Self-pay | Admitting: Gastroenterology

## 2018-09-11 DIAGNOSIS — J449 Chronic obstructive pulmonary disease, unspecified: Secondary | ICD-10-CM

## 2018-09-11 DIAGNOSIS — Z860100 Personal history of colon polyps, unspecified: Secondary | ICD-10-CM

## 2018-09-17 ENCOUNTER — Ambulatory Visit: Payer: Medicare Other | Admitting: Internal Medicine

## 2018-10-04 ENCOUNTER — Ambulatory Visit (INDEPENDENT_AMBULATORY_CARE_PROVIDER_SITE_OTHER)
Admission: RE | Admit: 2018-10-04 | Discharge: 2018-10-04 | Disposition: A | Discharge status: Home / Self Care | Payer: Medicare Other | Source: Ambulatory Visit | Attending: Internal Medicine | Admitting: Internal Medicine

## 2018-10-04 ENCOUNTER — Encounter: Payer: Self-pay | Admitting: Internal Medicine

## 2018-10-04 ENCOUNTER — Ambulatory Visit (INDEPENDENT_AMBULATORY_CARE_PROVIDER_SITE_OTHER): Payer: Medicare Other | Admitting: Internal Medicine

## 2018-10-04 VITALS — BP 102/70 | HR 90 | Ht 61.0 in | Wt 107.0 lb

## 2018-10-04 DIAGNOSIS — J9611 Chronic respiratory failure with hypoxia: Secondary | ICD-10-CM | POA: Diagnosis not present

## 2018-10-04 DIAGNOSIS — J449 Chronic obstructive pulmonary disease, unspecified: Secondary | ICD-10-CM

## 2018-10-04 DIAGNOSIS — R0602 Shortness of breath: Secondary | ICD-10-CM | POA: Diagnosis not present

## 2018-10-04 MED ORDER — ALBUTEROL SULFATE HFA 108 (90 BASE) MCG/ACT IN AERS: 2.0000 | INHALATION_SPRAY | RESPIRATORY_TRACT | 1 refills | Status: DC | PRN

## 2018-10-04 NOTE — Patient Instructions (Signed)
Work on inhaler technique:  relax and gently blow all the way out then take a nice smooth deep breath back in, triggering the inhaler at same time you start breathing in.  Hold for up to 5 seconds if you can.  . Rinse and gargle with water when done  No change in medications   Please remember to go to the  x-ray department    for your tests - we will call you with the results when they are available   Please schedule a follow up visit in 6 months but call sooner if needed

## 2018-10-04 NOTE — Progress Notes (Signed)
Subjective:    [name], female    DOB: 03-12-1951   MRN: [id number]      Brief patient profile:  11 yobf  Quit smoking 2010  with  GOLD III criteria copd / 0 2 dep     Significant tests/ events  She was seen by Dr Gwenette Greet 12/24/08 after hospitalization for LUL pna. CT angio 3/10 showed centrilobular emphysema.She was placed on oxygen around this time [facility] pt) .  PFTs  12/2008 FEv1 39%, DLCO 35%, mild air trapping    History of Present Illness  08/28/2015 acute extended ov/[name] re: GOLD III copd/ on spiriva and freq saba and 02 2lpm hs/ 3lpm with activity  Chief Complaint  Patient presents with  . Acute Visit    Pt c/o increased SOB for the past few wks- "I have attacks"- relates to the weather change. She takes albuterol inhaler 2 x per day on average and uses neb 1-2 x per day.   baseline doe = MMRC2 = can't walk a nl pace on a flat grade s sob  >>Stiolto rx       03/31/2016 acute extended ov/Okechukwu Regnier re: aecopd on anoro req by insurance   2lpm rest/3lpm  Chief Complaint  Patient presents with  . Acute Visit    Pt c/o increased DOE for the past 1-2 wks. She has been using neb at least once per day.  She also c/o facial puffiness and HA "I have had a head cold". She has had minimal cough with white sputum.     easily confused with details of care/ overusing neb and not using saba hfa   Not clear what the alternatives to anoro are. Doe = MMRC3 = can't walk 100 yards even at a slow pace at a flat grade s stopping due to sob  On 3lpm and uses w/c usually to go out.  rec Plan A = Automatic = Stiolto 2 each am  Plan B = Backup Only use your albuterol as a rescue medication  Plan C = Crisis - only use your albuterol nebulizer if you first try Plan B and it fails to help > ok to use the nebulizer up to every 4 hours but if start needing it regularly call for immediate appointment Plan D = Doctor - call me if B and C not adequate Plan E = ER - go to ER or  call 911 if all else fails   Prednisone 10 mg take  4 each am x 2 days,   2 each am x 2 days,  1 each am x 2 days and stop          09/13/2017  f/u ov/[name] re: copd  GOLD III/ still in pulmonary rehab reliant on samples 3lpm with activity/ 2lpm  Chief Complaint  Patient presents with  . Follow-up    Pt states that she has been good since last visit. Pt was given sample of Bevespi but does not think she is getting enough effect out of the medication and wants to switch to a different inhaler. States that she has been going to rehab and feels she is becoming stronger. DME: [facility], 3L with ambulation and 2L at rest.   never needs any albuterol in any form  Doe = MMRC2 = can't walk a nl pace on a flat grade s sob but does fine slow and flat on 02 pushing cart  Sleeping fine on 2lpm  Improved overall in  rehab  Sleeping ok flat on 2lpm  without nocturnal  or early am exacerbation  of respiratory  c/o's or need for noct saba. Also denies any obvious fluctuation of symptoms with weather or environmental changes or other aggravating or alleviating factors except as outlined above        10/04/2018  f/u ov/[name] re: copd GOLD III/ anoro and 02  2lpm hs/ 4lpm pulsed  Chief Complaint  Patient presents with  . Follow-up    Breathing is about the same. She states had PNA approx 2 months ago- txed by Dr Delfina Redwood. She rarely uses her albuterol inhaler or neb.   Dyspnea:  No longer doing shopping did shop at [employer] and did ok [employer]  Cough: none Sleeping: able to lie flat/ one pillow SABA use: don't use any saba 02: 4lpm pulsed, 2 lpm hs and at home    No obvious day to day or daytime variability or assoc excess/ purulent sputum or mucus plugs or hemoptysis or cp or chest tightness, subjective wheeze or overt sinus or hb symptoms.   Sleeping as above  without nocturnal  or early am exacerbation  of respiratory  c/o's or need for noct saba. Also denies any obvious fluctuation of symptoms  with weather or environmental changes or other aggravating or alleviating factors except as outlined above   No unusual exposure hx or h/o childhood pna/ asthma or knowledge of premature birth.  Current Allergies, Complete Past Medical History, Past Surgical History, Family History, and Social History were reviewed in [employer] record.  ROS  The following are not active complaints unless bolded Hoarseness, sore throat, dysphagia, dental problems, itching, sneezing,  nasal congestion or discharge of excess mucus or purulent secretions, ear ache,   fever, chills, sweats, unintended wt loss or wt gain, classically pleuritic or exertional cp,  orthopnea pnd or arm/hand swelling  or leg swelling, presyncope, palpitations, abdominal pain, anorexia, nausea, vomiting, diarrhea  or change in bowel habits or change in bladder habits, change in stools or change in urine, dysuria, hematuria,  rash, arthralgias, visual complaints, headache, numbness, weakness or ataxia or problems with walking or coordination,  change in mood or  memory.        Current Meds  Medication Sig  . albuterol (PROVENTIL HFA;VENTOLIN HFA) 108 (90 Base) MCG/ACT inhaler Inhale 2 puffs into the lungs every 4 (four) hours as needed for wheezing or shortness of breath.  Marland Kitchen albuterol (PROVENTIL) (2.5 MG/3ML) 0.083% nebulizer solution Take 3 mLs (2.5 mg total) by nebulization every 2 (two) hours as needed for wheezing or shortness of breath.  Marland Kitchen alendronate (FOSAMAX) 70 MG tablet TAKE 1 TABLET ONCE A WEEK  . aspirin EC 81 MG tablet Take 81 mg by mouth daily.  Marland Kitchen atenolol (TENORMIN) 25 MG tablet Take 25 mg by mouth daily.  . Cyanocobalamin (VITAMIN B-12 PO) Take 1 tablet by mouth daily.  Marland Kitchen guaiFENesin (MUCINEX) 600 MG 12 hr tablet Take 1 tablet (600 mg total) by mouth 2 (two) times daily.  Marland Kitchen levothyroxine (SYNTHROID, LEVOTHROID) 75 MCG tablet Take 75 mcg by mouth daily.  Marland Kitchen lovastatin (MEVACOR) 20 MG tablet Take 20 mg by  mouth at bedtime.  . mirtazapine (REMERON) 15 MG tablet Take 15 mg by mouth at bedtime as needed. sleep  . Multiple Vitamin (MULTIVITAMIN WITH MINERALS) TABS tablet Take 1 tablet by mouth daily.  . OXYGEN Pt uses 2lpm with sleep and 4 lpm with exertion  DME- [facility]  . umeclidinium-vilanterol [facility]  ELLIPTA) 62.5-25 MCG/INH AEPB Only open the device one time= one click  and then take your two separate drags to be sure you get it all  .                             Objective:   Physical Exam    Pleasant amb bf nad   10/04/2018         107  03/26/2018       110  09/13/2017     111  04/25/2017      107 10/26/2016      105  04/13/2016       98   03/31/16 97 lb (43.999 kg)  02/08/16 94 lb (42.638 kg)  01/22/16 92 lb 3.2 oz (41.822 kg)    Vital signs reviewed - Note on arrival 02 sats  91% on 4lpm pulsed         HEENT:  Edentulous/ nl  oropharynx. Nl external ear canals without cough reflex -  Mild bilateral non-specific turbinate edema     NECK :  without JVD/Nodes/TM/ nl carotid upstrokes bilaterally   LUNGS: no acc muscle use,  Mod barrel  contour chest wall with bilateral  Distant bs s audible wheeze and  without cough on insp or exp maneuver and mod  Hyperresonant  to  percussion bilaterally     CV:  RRR  no s3 or murmur or increase in P2, and no edema   ABD:  soft and nontender with pos mid insp Hoover's  in the supine position. No bruits or organomegaly appreciated, bowel sounds nl  MS:   Nl gait/  ext warm without deformities, calf tenderness, cyanosis or clubbing No obvious joint restrictions   SKIN: warm and dry without lesions    NEURO:  alert, approp, nl sensorium with  no motor or cerebellar deficits apparent.          CXR PA and Lateral:   10/04/2018 :    I personally reviewed images and agree with radiology impression as follows:   No active cardiopulmonary disease. Hyperexpansion of the lungs consistent with chronic obstructive pulmonary disease.        Assessment & Plan:

## 2018-10-04 NOTE — Progress Notes (Signed)
LMTCB

## 2018-10-05 ENCOUNTER — Telehealth: Payer: Self-pay | Admitting: Internal Medicine

## 2018-10-05 NOTE — Telephone Encounter (Signed)
Called and spoke with Patient.  CXR results and recommendations given.  Understanding stated.  Nothing further at this time. 

## 2018-10-07 ENCOUNTER — Encounter: Payer: Self-pay | Admitting: Internal Medicine

## 2018-10-07 NOTE — Assessment & Plan Note (Signed)
Quit smoking 2010 PFTs  12/2008 FEv1 39%, DLCO 35%, mild air trapping  - 04/25/2017  After extensive coaching HFA effectiveness =    75% (short Ti) - Referred 04/25/2017 rehab - 06/10/2017 insurance won't cover stiolto > changed to bevespi 2bid but preferred anoro  10/04/2018  After extensive coaching inhaler device,  effectiveness =    90% with elipta, 75% with hfa (short Ti)   Concerned she's not more active as has shown she is able to get out and do [employer] shopping but choses not to and at risk of deconditioning  Remains Pt is Group B in terms of symptom/risk and laba/lama therefore appropriate rx at this point so no change rx for now  Advised: Formulary restrictions will be an ongoing challenge for the forseable future and I would be happy to pick an alternative if the pt will first  provide me a list of them but pt  will need to return here for training for any new device that is required eg dpi vs hfa vs respimat.    In meantime we can always provide samples so the patient never runs out of any needed respiratory medications.

## 2018-10-07 NOTE — Assessment & Plan Note (Signed)
rx 2lpm rest/ 4lpm pulsed with activity as of 10/04/2018    Adequate control on present rx, reviewed in detail with pt > no change in rx needed     I had an extended discussion with the patient reviewing all relevant studies completed to date and  lasting 15 to 20 minutes of a 25 minute visit    See device teaching which extended face to face time for this visit.  Each maintenance medication was reviewed in detail including emphasizing most importantly the difference between maintenance and prns and under what circumstances the prns are to be triggered using an action plan format that is not reflected in the computer generated alphabetically organized AVS which I have not found useful in most complex patients, especially with respiratory illnesses  Please see AVS for specific instructions unique to this visit that I personally wrote and verbalized to the the pt in detail and then reviewed with pt  by my nurse highlighting any  changes in therapy recommended at today's visit to their plan of care.

## 2018-10-26 ENCOUNTER — Other Ambulatory Visit: Payer: Self-pay | Admitting: Internal Medicine

## 2018-11-24 ENCOUNTER — Other Ambulatory Visit: Payer: Self-pay | Admitting: Internal Medicine

## 2018-12-10 ENCOUNTER — Other Ambulatory Visit: Payer: Medicare Other

## 2018-12-26 ENCOUNTER — Other Ambulatory Visit: Payer: Medicare Other

## 2019-02-06 ENCOUNTER — Other Ambulatory Visit: Payer: Medicare Other

## 2019-03-11 ENCOUNTER — Inpatient Hospital Stay: Admission: RE | Admit: 2019-03-11 | Payer: Medicare Other | Source: Ambulatory Visit

## 2019-03-15 ENCOUNTER — Other Ambulatory Visit: Payer: Self-pay | Admitting: Internal Medicine

## 2019-04-04 ENCOUNTER — Ambulatory Visit: Payer: Medicare Other | Admitting: Internal Medicine

## 2019-04-10 DIAGNOSIS — J449 Chronic obstructive pulmonary disease, unspecified: Secondary | ICD-10-CM | POA: Diagnosis not present

## 2019-05-09 ENCOUNTER — Encounter: Payer: Self-pay | Admitting: Internal Medicine

## 2019-05-09 ENCOUNTER — Other Ambulatory Visit: Payer: Self-pay

## 2019-05-09 ENCOUNTER — Ambulatory Visit (INDEPENDENT_AMBULATORY_CARE_PROVIDER_SITE_OTHER): Payer: Medicare HMO | Admitting: Internal Medicine

## 2019-05-09 DIAGNOSIS — J449 Chronic obstructive pulmonary disease, unspecified: Secondary | ICD-10-CM | POA: Diagnosis not present

## 2019-05-09 DIAGNOSIS — J9611 Chronic respiratory failure with hypoxia: Secondary | ICD-10-CM | POA: Diagnosis not present

## 2019-05-09 NOTE — Patient Instructions (Addendum)
Blow anoro out thru your nose may help dry it up as might trying off humidity for your 0xygen   Please schedule a follow up visit in 6 months but call sooner if needed

## 2019-05-09 NOTE — Progress Notes (Signed)
Subjective:    [name], female    DOB: 11/07/1950   MRN: [id number]      Brief patient profile:  12  yobf  Quit smoking 2010  with  GOLD III criteria copd / 0 2 dep     Significant tests/ events  She was seen by Dr Gwenette Greet 12/24/08 after hospitalization for LUL pna. CT angio 3/10 showed centrilobular emphysema.She was placed on oxygen around this time [facility] pt) .  PFTs  12/2008 FEv1 39%, DLCO 35%, mild air trapping    History of Present Illness  08/28/2015 acute extended ov/ re: GOLD III copd/ on spiriva and freq saba and 02 2lpm hs/ 3lpm with activity  Chief Complaint  Patient presents with  . Acute Visit    Pt c/o increased SOB for the past few wks- "I have attacks"- relates to the weather change. She takes albuterol inhaler 2 x per day on average and uses neb 1-2 x per day.   baseline doe = MMRC2 = can't walk a nl pace on a flat grade s sob  >>Stiolto rx       03/31/2016 acute extended ov/ re: aecopd on anoro req by insurance   2lpm rest/3lpm  Chief Complaint  Patient presents with  . Acute Visit    Pt c/o increased DOE for the past 1-2 wks. She has been using neb at least once per day.  She also c/o facial puffiness and HA "I have had a head cold". She has had minimal cough with white sputum.     easily confused with details of care/ overusing neb and not using saba hfa   Not clear what the alternatives to anoro are. Doe = MMRC3 = can't walk 100 yards even at a slow pace at a flat grade s stopping due to sob  On 3lpm and uses w/c usually to go out.  rec Plan A = Automatic = Stiolto 2 each am  Plan B = Backup Only use your albuterol as a rescue medication  Plan C = Crisis - only use your albuterol nebulizer if you first try Plan B and it fails to help > ok to use the nebulizer up to every 4 hours but if start needing it regularly call for immediate appointment Plan D = Doctor - call me if B and C not adequate Plan E = ER - go to ER  or call 911 if all else fails   Prednisone 10 mg take  4 each am x 2 days,   2 each am x 2 days,  1 each am x 2 days and stop          09/13/2017  f/u ov/ re: copd  GOLD III/ still in pulmonary rehab reliant on samples 3lpm with activity/ 2lpm  Chief Complaint  Patient presents with  . Follow-up    Pt states that she has been good since last visit. Pt was given sample of Bevespi but does not think she is getting enough effect out of the medication and wants to switch to a different inhaler. States that she has been going to rehab and feels she is becoming stronger. DME: [facility], 3L with ambulation and 2L at rest.   never needs any albuterol in any form  Doe = MMRC2 = can't walk a nl pace on a flat grade s sob but does fine slow and flat on 02 pushing cart  Sleeping fine on 2lpm  Improved overall  in rehab  Sleeping ok flat on 2lpm  without nocturnal  or early am exacerbation  of respiratory  c/o's or need for noct saba. Also denies any obvious fluctuation of symptoms with weather or environmental changes or other aggravating or alleviating factors except as outlined above           05/09/2019  f/u ov/ re:  COPD III/ anoro and 02  Chief Complaint  Patient presents with  . Follow-up    Patient reports that her breathing is doing well and she has not had to use her rescue inhaler  Dyspnea:  Mailbox and back s stopping on poc Cough: none, some nasal dischage while mucus  Sleeping: on flat bed, one pillow SABA use: none  02: 2lpm hs,  2.5 lpm at home and 4lpm poc    No obvious day to day or daytime variability or assoc excess/ purulent sputum or mucus plugs or hemoptysis or cp or chest tightness, subjective wheeze or overt  hb symptoms.    without nocturnal  or early am exacerbation  of respiratory  c/o's or need for noct saba. Also denies any obvious fluctuation of symptoms with weather or environmental changes or other aggravating or alleviating factors except  as outlined above   No unusual exposure hx or h/o childhood pna/ asthma or knowledge of premature birth.  Current Allergies, Complete Past Medical History, Past Surgical History, Family History, and Social History were reviewed in [employer] record.  ROS  The following are not active complaints unless bolded Hoarseness, sore throat, dysphagia, dental problems, itching, sneezing,  nasal congestion or discharge of excess mucus or purulent secretions, ear ache,   fever, chills, sweats, unintended wt loss or wt gain, classically pleuritic or exertional cp,  orthopnea pnd or arm/hand swelling  or leg swelling, presyncope, palpitations, abdominal pain, anorexia, nausea, vomiting, diarrhea  or change in bowel habits or change in bladder habits, change in stools or change in urine, dysuria, hematuria,  rash, arthralgias, visual complaints, headache, numbness, weakness or ataxia or problems with walking or coordination,  change in mood or  memory.        Current Meds  Medication Sig  . albuterol (PROVENTIL) (2.5 MG/3ML) 0.083% nebulizer solution Take 3 mLs (2.5 mg total) by nebulization every 2 (two) hours as needed for wheezing or shortness of breath.  Marland Kitchen albuterol (VENTOLIN HFA) 108 (90 Base) MCG/ACT inhaler INHALE 2 PUFFS INTO THE LUNGS EVERY 4 (FOUR) HOURS AS NEEDED FOR WHEEZING OR SHORTNESS OF BREATH.  Marland Kitchen alendronate (FOSAMAX) 70 MG tablet TAKE 1 TABLET ONCE A WEEK  . ANORO ELLIPTA 62.5-25 MCG/INH AEPB ONLY OPEN THE DEVICE ONE TIME,ONE CLICK THEN TAKE YOUR TWO SEPARATE DRAGS TO BE SURE YOU GET IT ALL  . aspirin EC 81 MG tablet Take 81 mg by mouth daily.  Marland Kitchen atenolol (TENORMIN) 25 MG tablet Take 25 mg by mouth daily.  . Cyanocobalamin (VITAMIN B-12 PO) Take 1 tablet by mouth daily.  Marland Kitchen guaiFENesin (MUCINEX) 600 MG 12 hr tablet Take 1 tablet (600 mg total) by mouth 2 (two) times daily.  Marland Kitchen levothyroxine (SYNTHROID, LEVOTHROID) 75 MCG tablet Take 75 mcg by mouth daily.  Marland Kitchen lovastatin  (MEVACOR) 20 MG tablet Take 20 mg by mouth at bedtime.  . mirtazapine (REMERON) 15 MG tablet Take 15 mg by mouth at bedtime as needed. sleep  . Multiple Vitamin (MULTIVITAMIN WITH MINERALS) TABS tablet Take 1 tablet by mouth daily.  . OXYGEN Pt uses 2lpm with sleep  and 4 lpm with exertion  DME- [facility]                           Objective:   Physical Exam    Pleasant amb wf nad   05/09/2019          106  10/04/2018         107  03/26/2018       110  09/13/2017     111  04/25/2017      107 10/26/2016      105  04/13/2016       98   03/31/16 97 lb (43.999 kg)  02/08/16 94 lb (42.638 kg)  01/22/16 92 lb 3.2 oz (41.822 kg)    Vital signs reviewed - Note on arrival 02 sats  97% on 4lpm poc              HEENT: Edentulous/ nl oropharynx. Nl external ear canals without cough reflex -  Mild/mod  bilateral non-specific turbinate edema /min mucoid secretions   NECK :  without JVD/Nodes/TM/ nl carotid upstrokes bilaterally   LUNGS: no acc muscle use,  Mod barrel  contour chest wall with bilateral  Distant bs s audible wheeze and  without cough on insp or exp maneuver and mod  Hyperresonant  to  percussion bilaterally     CV:  RRR  no s3 or murmur or increase in P2, and no edema   ABD:  soft and nontender with pos mid insp Hoover's  in the supine position. No bruits or organomegaly appreciated, bowel sounds nl  MS:     ext warm without deformities, calf tenderness, cyanosis or clubbing No obvious joint restrictions   SKIN: warm and dry without lesions    NEURO:  alert, approp, nl sensorium with  no motor or cerebellar deficits apparent.             Assessment & Plan:

## 2019-05-11 DIAGNOSIS — J449 Chronic obstructive pulmonary disease, unspecified: Secondary | ICD-10-CM | POA: Diagnosis not present

## 2019-05-13 ENCOUNTER — Encounter: Payer: Self-pay | Admitting: Internal Medicine

## 2019-05-13 NOTE — Assessment & Plan Note (Addendum)
Quit smoking 2010 PFTs  12/2008 FEv1 39%, DLCO 35%, mild air trapping  - 04/25/2017  After extensive coaching HFA effectiveness =    75% (short Ti) - Referred 04/25/2017 rehab - 06/10/2017 insurance won't cover stiolto > changed to bevespi 2bid but preferred anoro  Pt is Group B in terms of symptom/risk and laba/lama therefore appropriate rx at this point >>>  Continue anoro   - The optimal  method of use, as well as anticipated side effects, of a elipta  inhaler were discussed and demonstrated to the patient.

## 2019-05-13 NOTE — Assessment & Plan Note (Addendum)
rx 2lpm rest/ 4lpm pulsed with activity as of 05/09/2019    She probably doesn't need the 02  Humidified at low flow and having too much nasal discharge from it so try off it and blow anoro out thru nose to see if antichonergics help and if not can try atrovent NS or refer to ent    I had an extended discussion with the patient reviewing all relevant studies completed to date and  lasting 15 to 20 minutes of a 25 minute visit    I performed detailed device teaching using a teach back method which extended face to face time for this visit (see above)  Each maintenance medication was reviewed in detail including emphasizing most importantly the difference between maintenance and prns and under what circumstances the prns are to be triggered using an action plan format that is not reflected in the computer generated alphabetically organized AVS which I have not found useful in most complex patients, especially with respiratory illnesses  Please see AVS for specific instructions unique to this visit that I personally wrote and verbalized to the the pt in detail and then reviewed with pt  by my nurse highlighting any  changes in therapy recommended at today's visit to their plan of care.      >>> f/ q 6 m, sooner prn

## 2019-06-11 DIAGNOSIS — J449 Chronic obstructive pulmonary disease, unspecified: Secondary | ICD-10-CM | POA: Diagnosis not present

## 2019-07-11 DIAGNOSIS — J449 Chronic obstructive pulmonary disease, unspecified: Secondary | ICD-10-CM | POA: Diagnosis not present

## 2019-07-27 DIAGNOSIS — R69 Illness, unspecified: Secondary | ICD-10-CM | POA: Diagnosis not present

## 2019-08-11 DIAGNOSIS — J449 Chronic obstructive pulmonary disease, unspecified: Secondary | ICD-10-CM | POA: Diagnosis not present

## 2019-09-10 DIAGNOSIS — J449 Chronic obstructive pulmonary disease, unspecified: Secondary | ICD-10-CM | POA: Diagnosis not present

## 2019-09-12 DIAGNOSIS — E46 Unspecified protein-calorie malnutrition: Secondary | ICD-10-CM | POA: Diagnosis not present

## 2019-09-12 DIAGNOSIS — Z1231 Encounter for screening mammogram for malignant neoplasm of breast: Secondary | ICD-10-CM | POA: Diagnosis not present

## 2019-09-12 DIAGNOSIS — E039 Hypothyroidism, unspecified: Secondary | ICD-10-CM | POA: Diagnosis not present

## 2019-09-12 DIAGNOSIS — M810 Age-related osteoporosis without current pathological fracture: Secondary | ICD-10-CM | POA: Diagnosis not present

## 2019-09-12 DIAGNOSIS — E7800 Pure hypercholesterolemia, unspecified: Secondary | ICD-10-CM | POA: Diagnosis not present

## 2019-09-12 DIAGNOSIS — J449 Chronic obstructive pulmonary disease, unspecified: Secondary | ICD-10-CM | POA: Diagnosis not present

## 2019-09-12 DIAGNOSIS — R7309 Other abnormal glucose: Secondary | ICD-10-CM | POA: Diagnosis not present

## 2019-10-11 DIAGNOSIS — J449 Chronic obstructive pulmonary disease, unspecified: Secondary | ICD-10-CM | POA: Diagnosis not present

## 2019-10-24 ENCOUNTER — Other Ambulatory Visit: Payer: Self-pay | Admitting: Internal Medicine

## 2019-11-11 DIAGNOSIS — J449 Chronic obstructive pulmonary disease, unspecified: Secondary | ICD-10-CM | POA: Diagnosis not present

## 2019-12-09 DIAGNOSIS — J449 Chronic obstructive pulmonary disease, unspecified: Secondary | ICD-10-CM | POA: Diagnosis not present

## 2020-01-09 DIAGNOSIS — J449 Chronic obstructive pulmonary disease, unspecified: Secondary | ICD-10-CM | POA: Diagnosis not present

## 2020-02-08 DIAGNOSIS — J449 Chronic obstructive pulmonary disease, unspecified: Secondary | ICD-10-CM | POA: Diagnosis not present

## 2020-03-10 DIAGNOSIS — J449 Chronic obstructive pulmonary disease, unspecified: Secondary | ICD-10-CM | POA: Diagnosis not present

## 2020-03-27 DIAGNOSIS — J449 Chronic obstructive pulmonary disease, unspecified: Secondary | ICD-10-CM | POA: Diagnosis not present

## 2020-03-27 DIAGNOSIS — E039 Hypothyroidism, unspecified: Secondary | ICD-10-CM | POA: Diagnosis not present

## 2020-03-27 DIAGNOSIS — E785 Hyperlipidemia, unspecified: Secondary | ICD-10-CM | POA: Diagnosis not present

## 2020-03-27 DIAGNOSIS — G8929 Other chronic pain: Secondary | ICD-10-CM | POA: Diagnosis not present

## 2020-03-27 DIAGNOSIS — M2550 Pain in unspecified joint: Secondary | ICD-10-CM | POA: Diagnosis not present

## 2020-03-27 DIAGNOSIS — Z7982 Long term (current) use of aspirin: Secondary | ICD-10-CM | POA: Diagnosis not present

## 2020-03-27 DIAGNOSIS — I10 Essential (primary) hypertension: Secondary | ICD-10-CM | POA: Diagnosis not present

## 2020-03-27 DIAGNOSIS — G4700 Insomnia, unspecified: Secondary | ICD-10-CM | POA: Diagnosis not present

## 2020-03-27 DIAGNOSIS — Z791 Long term (current) use of non-steroidal anti-inflammatories (NSAID): Secondary | ICD-10-CM | POA: Diagnosis not present

## 2020-03-27 DIAGNOSIS — M810 Age-related osteoporosis without current pathological fracture: Secondary | ICD-10-CM | POA: Diagnosis not present

## 2020-03-27 DIAGNOSIS — Z008 Encounter for other general examination: Secondary | ICD-10-CM | POA: Diagnosis not present

## 2020-04-09 DIAGNOSIS — J449 Chronic obstructive pulmonary disease, unspecified: Secondary | ICD-10-CM | POA: Diagnosis not present

## 2020-04-10 DIAGNOSIS — E7800 Pure hypercholesterolemia, unspecified: Secondary | ICD-10-CM | POA: Diagnosis not present

## 2020-04-10 DIAGNOSIS — Z1389 Encounter for screening for other disorder: Secondary | ICD-10-CM | POA: Diagnosis not present

## 2020-04-10 DIAGNOSIS — I10 Essential (primary) hypertension: Secondary | ICD-10-CM | POA: Diagnosis not present

## 2020-04-10 DIAGNOSIS — M810 Age-related osteoporosis without current pathological fracture: Secondary | ICD-10-CM | POA: Diagnosis not present

## 2020-04-10 DIAGNOSIS — Z1231 Encounter for screening mammogram for malignant neoplasm of breast: Secondary | ICD-10-CM | POA: Diagnosis not present

## 2020-04-10 DIAGNOSIS — R7309 Other abnormal glucose: Secondary | ICD-10-CM | POA: Diagnosis not present

## 2020-04-10 DIAGNOSIS — J449 Chronic obstructive pulmonary disease, unspecified: Secondary | ICD-10-CM | POA: Diagnosis not present

## 2020-04-10 DIAGNOSIS — E039 Hypothyroidism, unspecified: Secondary | ICD-10-CM | POA: Diagnosis not present

## 2020-04-10 DIAGNOSIS — Z0000 Encounter for general adult medical examination without abnormal findings: Secondary | ICD-10-CM | POA: Diagnosis not present

## 2020-05-07 ENCOUNTER — Other Ambulatory Visit: Payer: Self-pay | Admitting: Internal Medicine

## 2020-05-08 DIAGNOSIS — J441 Chronic obstructive pulmonary disease with (acute) exacerbation: Secondary | ICD-10-CM | POA: Diagnosis not present

## 2020-05-08 DIAGNOSIS — E785 Hyperlipidemia, unspecified: Secondary | ICD-10-CM | POA: Diagnosis not present

## 2020-05-08 DIAGNOSIS — E039 Hypothyroidism, unspecified: Secondary | ICD-10-CM | POA: Diagnosis not present

## 2020-05-08 DIAGNOSIS — G4700 Insomnia, unspecified: Secondary | ICD-10-CM | POA: Diagnosis not present

## 2020-05-08 DIAGNOSIS — I10 Essential (primary) hypertension: Secondary | ICD-10-CM | POA: Diagnosis not present

## 2020-05-08 DIAGNOSIS — M810 Age-related osteoporosis without current pathological fracture: Secondary | ICD-10-CM | POA: Diagnosis not present

## 2020-05-08 DIAGNOSIS — E7800 Pure hypercholesterolemia, unspecified: Secondary | ICD-10-CM | POA: Diagnosis not present

## 2020-05-08 DIAGNOSIS — J449 Chronic obstructive pulmonary disease, unspecified: Secondary | ICD-10-CM | POA: Diagnosis not present

## 2020-05-10 DIAGNOSIS — J449 Chronic obstructive pulmonary disease, unspecified: Secondary | ICD-10-CM | POA: Diagnosis not present

## 2020-05-31 DIAGNOSIS — H524 Presbyopia: Secondary | ICD-10-CM | POA: Diagnosis not present

## 2020-06-02 ENCOUNTER — Encounter: Payer: Self-pay | Admitting: Internal Medicine

## 2020-06-02 ENCOUNTER — Ambulatory Visit: Payer: Medicare HMO | Admitting: Internal Medicine

## 2020-06-02 ENCOUNTER — Other Ambulatory Visit: Payer: Self-pay

## 2020-06-02 VITALS — BP 142/76 | HR 83 | Temp 98.6°F | Ht 61.5 in | Wt 105.8 lb

## 2020-06-02 DIAGNOSIS — J449 Chronic obstructive pulmonary disease, unspecified: Secondary | ICD-10-CM

## 2020-06-02 DIAGNOSIS — Z72821 Inadequate sleep hygiene: Secondary | ICD-10-CM | POA: Diagnosis not present

## 2020-06-02 DIAGNOSIS — J9611 Chronic respiratory failure with hypoxia: Secondary | ICD-10-CM

## 2020-06-02 DIAGNOSIS — J9612 Chronic respiratory failure with hypercapnia: Secondary | ICD-10-CM | POA: Diagnosis not present

## 2020-06-02 NOTE — Assessment & Plan Note (Signed)
Quit smoking 2010 PFTs  12/2008 FEv1 39%, DLCO 35%, mild air trapping  - 04/25/2017  After extensive coaching HFA effectiveness =    75% (short Ti) - Referred 04/25/2017 rehab - 06/10/2017 insurance won't cover stiolto > changed to bevespi 2bid but preferred anoro   Group D in terms of symptom/risk and laba/lama/ICS  therefore appropriate rx at this point >>>  anoro appropriate, no changes needed   Reviewed saba: I spent extra time with pt today reviewing appropriate use of albuterol for prn use on exertion with the following points: 1) saba is for relief of sob that does not improve by walking a slower pace or resting but rather if the pt does not improve after trying this first. 2) If the pt is convinced, as many are, that saba helps recover from activity faster then it's easy to tell if this is the case by re-challenging : ie stop, take the inhaler, then p 5 minutes try the exact same activity (intensity of workload) that just caused the symptoms and see if they are substantially diminished or not after saba 3) if there is an activity that reproducibly causes the symptoms, try the saba 15 min before the activity on alternate days   If in fact the saba really does help, then fine to continue to use it prn but advised may need to look closer at the maintenance regimen being used to achieve better control of airways disease with exertion.

## 2020-06-02 NOTE — Assessment & Plan Note (Addendum)
rx 2lpm rest/ 4lpm pulsed with activity as of 05/09/2019   - HCO3 09/12/2019 =  34  - HC03   04/10/20        =  38  - POC qualification  06/02/2020  Ok on RA at rest, drops on 250 ft to 88 corrected to 93% on POC   As of 06/02/2020 rec 2.5 lpm hs/ none at rest and 3lpm POC ambulating

## 2020-06-02 NOTE — Patient Instructions (Addendum)
Make sure you check your oxygen saturations at highest level of activity to be sure it stays over 90% and adjust upward to maintain this level if needed but remember to turn it back to previous settings when you stop (to conserve your supply).   For now you need 3lpm from your present machine or 3lpm from Portable concentrator when you walk but nothing needed sitting at rest so save your 02 for when your saturation level is less than 90%   Please schedule a follow up visit in  12 months but call sooner if needed

## 2020-06-02 NOTE — Assessment & Plan Note (Addendum)
Onset "all her life"  - rec standard sleep  06/02/2020    Each maintenance medication was reviewed in detail including emphasizing most importantly the difference between maintenance and prns and under what circumstances the prns are to be triggered using an action plan format where appropriate.  Total time for H and P, chart review, counseling, teaching device and generating customized AVS unique to this office visit / charting = 30 min

## 2020-06-02 NOTE — Progress Notes (Signed)
Subjective:    [name], female    DOB: [birth year]   MRN: [id number]      Brief patient profile:  45  yobf  Quit smoking 2010  with  GOLD III criteria copd / 0 2 dep     Significant tests/ events  She was seen by [name] 12/24/08 after hospitalization for LUL pna. CT angio 3/10 showed centrilobular emphysema.She was placed on oxygen around this time [facility] pt) .  PFTs  12/2008 FEv1 39%, DLCO 35%, mild air trapping    History of Present Illness  08/28/2015 acute extended ov/[name] re: GOLD III copd/ on spiriva and freq saba and 02 2lpm hs/ 3lpm with activity  Chief Complaint  Patient presents with  . Acute Visit    Pt c/o increased SOB for the past few wks- "I have attacks"- relates to the weather change. She takes albuterol inhaler 2 x per day on average and uses neb 1-2 x per day.   baseline doe = MMRC2 = can't walk a nl pace on a flat grade s sob  >>Stiolto rx       03/31/2016 acute extended ov/[name] re: aecopd on anoro req by insurance   2lpm rest/3lpm  Chief Complaint  Patient presents with  . Acute Visit    Pt c/o increased DOE for the past 1-2 wks. She has been using neb at least once per day.  She also c/o facial puffiness and HA "I have had a head cold". She has had minimal cough with white sputum.     easily confused with details of care/ overusing neb and not using saba hfa   Not clear what the alternatives to anoro are. Doe = MMRC3 = can't walk 100 yards even at a slow pace at a flat grade s stopping due to sob  On 3lpm and uses w/c usually to go out.  rec Plan A = Automatic = Stiolto 2 each am  Plan B = Backup Only use your albuterol as a rescue medication  Plan C = Crisis - only use your albuterol nebulizer if you first try Plan B and it fails to help > ok to use the nebulizer up to every 4 hours but if start needing it regularly call for immediate appointment Plan D = Doctor - call me if B and C not adequate Plan E = ER - go to ER  or call 911 if all else fails   Prednisone 10 mg take  4 each am x 2 days,   2 each am x 2 days,  1 each am x 2 days and stop          09/13/2017  f/u ov/[name] re: copd  GOLD III/ still in pulmonary rehab reliant on samples 3lpm with activity/ 2lpm  Chief Complaint  Patient presents with  . Follow-up    Pt states that she has been good since last visit. Pt was given sample of Bevespi but does not think she is getting enough effect out of the medication and wants to switch to a different inhaler. States that she has been going to rehab and feels she is becoming stronger. DME: [facility], 3L with ambulation and 2L at rest.   never needs any albuterol in any form  Doe = MMRC2 = can't walk a nl pace on a flat grade s sob but does fine slow and flat on 02 pushing cart  Sleeping fine on 2lpm  Improved overall  in rehab  Sleeping ok flat on 2lpm  without nocturnal  or early am exacerbation  of respiratory  c/o's or need for noct saba. Also denies any obvious fluctuation of symptoms with weather or environmental changes or other aggravating or alleviating factors except as outlined above      05/09/2019  f/u ov/[name] re:  COPD III/ anoro and 02  Chief Complaint  Patient presents with  . Follow-up    Patient reports that her breathing is doing well and she has not had to use her rescue inhaler  Dyspnea:  Mailbox and back s stopping on poc Cough: none, some nasal dischage while mucus  Sleeping: on flat bed, one pillow SABA use: none  02: 2lpm hs,  2.5 lpm at home and 4lpm poc  rec Blow anoro out thru your nose may help dry it up as might trying off humidity for your 0xygen   06/02/2020  f/u ov/[name] re:  GOLD III  Chief Complaint  Patient presents with  . Follow-up    Pt doing well. Denies any problems   Dyspnea:  No longer walking mb / just in house - walks [employer] pushing on buggy  Cough: no  Sleeping: one pillow bed is flat  SABA use: none  02: 2.5lpm and 4 portable pulsed      No obvious day to day or daytime variability or assoc excess/ purulent sputum or mucus plugs or hemoptysis or cp or chest tightness, subjective wheeze or overt sinus or hb symptoms.   Sleeping  without nocturnal  or early am exacerbation  of respiratory  c/o's or need for noct saba. Also denies any obvious fluctuation of symptoms with weather or environmental changes or other aggravating or alleviating factors except as outlined above   No unusual exposure hx or h/o childhood pna/ asthma or knowledge of premature birth.  Current Allergies, Complete Past Medical History, Past Surgical History, Family History, and Social History were reviewed in [employer] record.  ROS  The following are not active complaints unless bolded Hoarseness, sore throat, dysphagia, dental problems, itching, sneezing,  nasal congestion or discharge of excess mucus or purulent secretions, ear ache,   fever, chills, sweats, unintended wt loss or wt gain, classically pleuritic or exertional cp,  orthopnea pnd or arm/hand swelling  or leg swelling, presyncope, palpitations, abdominal pain, anorexia, nausea, vomiting, diarrhea  or change in bowel habits or change in bladder habits, change in stools or change in urine, dysuria, hematuria,  rash, arthralgias, visual complaints, headache, numbness, weakness or ataxia or problems with walking or coordination,  change in mood or  Memory. Insomnia all her life         Current Meds  Medication Sig  . albuterol (PROVENTIL) (2.5 MG/3ML) 0.083% nebulizer solution Take 3 mLs (2.5 mg total) by nebulization every 2 (two) hours as needed for wheezing or shortness of breath.  Marland Kitchen albuterol (VENTOLIN HFA) 108 (90 Base) MCG/ACT inhaler INHALE 2 PUFFS INTO THE LUNGS EVERY 4 (FOUR) HOURS AS NEEDED FOR WHEEZING OR SHORTNESS OF BREATH.  Marland Kitchen alendronate (FOSAMAX) 70 MG tablet TAKE 1 TABLET ONCE A WEEK  . ANORO ELLIPTA 62.5-25 MCG/INH AEPB ONLY OPEN THE DEVICE ONE TIME,ONE  CLICK THEN TAKE YOUR TWO SEPARATE DRAGS TO BE SURE YOU GET IT ALL  . aspirin EC 81 MG tablet Take 81 mg by mouth daily.  Marland Kitchen atenolol (TENORMIN) 25 MG tablet Take 25 mg by mouth daily.  . Cyanocobalamin (VITAMIN B-12 PO) Take 1 tablet  by mouth daily.  Marland Kitchen guaiFENesin (MUCINEX) 600 MG 12 hr tablet Take 1 tablet (600 mg total) by mouth 2 (two) times daily.  Marland Kitchen levothyroxine (SYNTHROID, LEVOTHROID) 75 MCG tablet Take 75 mcg by mouth daily.  . mirtazapine (REMERON) 15 MG tablet Take 15 mg by mouth at bedtime as needed. sleep  . Multiple Vitamin (MULTIVITAMIN WITH MINERALS) TABS tablet Take 1 tablet by mouth daily.  . OXYGEN Pt uses 2lpm with sleep and 4 lpm with exertion  DME- [facility]  . rosuvastatin (CRESTOR) 20 MG tablet Take 20 mg by mouth daily.                         Objective:   Physical Exam    Pleasant amb bf nad   06/02/2020         105 05/09/2019          106  10/04/2018         107  03/26/2018       110  09/13/2017     111  04/25/2017      107 10/26/2016      105  04/13/2016       98   03/31/16 97 lb (43.999 kg)  02/08/16 94 lb (42.638 kg)  01/22/16 92 lb 3.2 oz (41.822 kg)    Vital signs reviewed  06/02/2020  - Note at rest 02 sats  100% on 4lpm pulsed         HEENT: Edentulous       HEENT : pt wearing mask not removed for exam due to covid -19 concerns.    NECK :  without JVD/Nodes/TM/ nl carotid upstrokes bilaterally   LUNGS: no acc muscle use,  Mod barrel  contour chest wall with bilateral  Distant bs s audible wheeze and  without cough on insp or exp maneuvers and mod  Hyperresonant  to  percussion bilaterally     CV:  RRR  no s3 or murmur or increase in P2, and no edema   ABD:  soft and nontender with pos mid insp Hoover's  in the supine position. No bruits or organomegaly appreciated, bowel sounds nl  MS:  Slow but steady gait/   ext warm without deformities, calf tenderness, cyanosis or clubbing No obvious joint restrictions   SKIN: warm and dry without  lesions    NEURO:  alert, approp, nl sensorium with  no motor or cerebellar deficits apparent.            Assessment & Plan:

## 2020-06-04 ENCOUNTER — Telehealth: Payer: Self-pay | Admitting: Internal Medicine

## 2020-06-04 DIAGNOSIS — G4733 Obstructive sleep apnea (adult) (pediatric): Secondary | ICD-10-CM

## 2020-06-04 NOTE — Telephone Encounter (Signed)
Spoke with patient regarding prior message. Patient stated that her DME company for oxygen is [employer]. Patient was unsure of her company . Put a new order in for her O2. Patient is aware and voice is understanding. Nothing else further needed.

## 2020-06-10 DIAGNOSIS — J449 Chronic obstructive pulmonary disease, unspecified: Secondary | ICD-10-CM | POA: Diagnosis not present

## 2020-06-17 DIAGNOSIS — H5203 Hypermetropia, bilateral: Secondary | ICD-10-CM | POA: Diagnosis not present

## 2020-06-17 DIAGNOSIS — H52209 Unspecified astigmatism, unspecified eye: Secondary | ICD-10-CM | POA: Diagnosis not present

## 2020-06-17 DIAGNOSIS — H524 Presbyopia: Secondary | ICD-10-CM | POA: Diagnosis not present

## 2020-06-17 DIAGNOSIS — H5213 Myopia, bilateral: Secondary | ICD-10-CM | POA: Diagnosis not present

## 2020-06-22 DIAGNOSIS — I10 Essential (primary) hypertension: Secondary | ICD-10-CM | POA: Diagnosis not present

## 2020-06-22 DIAGNOSIS — M810 Age-related osteoporosis without current pathological fracture: Secondary | ICD-10-CM | POA: Diagnosis not present

## 2020-06-22 DIAGNOSIS — J441 Chronic obstructive pulmonary disease with (acute) exacerbation: Secondary | ICD-10-CM | POA: Diagnosis not present

## 2020-06-22 DIAGNOSIS — E039 Hypothyroidism, unspecified: Secondary | ICD-10-CM | POA: Diagnosis not present

## 2020-06-22 DIAGNOSIS — E7800 Pure hypercholesterolemia, unspecified: Secondary | ICD-10-CM | POA: Diagnosis not present

## 2020-06-22 DIAGNOSIS — J449 Chronic obstructive pulmonary disease, unspecified: Secondary | ICD-10-CM | POA: Diagnosis not present

## 2020-06-22 DIAGNOSIS — E785 Hyperlipidemia, unspecified: Secondary | ICD-10-CM | POA: Diagnosis not present

## 2020-06-22 DIAGNOSIS — G4700 Insomnia, unspecified: Secondary | ICD-10-CM | POA: Diagnosis not present

## 2020-07-09 DIAGNOSIS — E039 Hypothyroidism, unspecified: Secondary | ICD-10-CM | POA: Diagnosis not present

## 2020-07-09 DIAGNOSIS — M810 Age-related osteoporosis without current pathological fracture: Secondary | ICD-10-CM | POA: Diagnosis not present

## 2020-07-09 DIAGNOSIS — J449 Chronic obstructive pulmonary disease, unspecified: Secondary | ICD-10-CM | POA: Diagnosis not present

## 2020-07-09 DIAGNOSIS — E785 Hyperlipidemia, unspecified: Secondary | ICD-10-CM | POA: Diagnosis not present

## 2020-07-09 DIAGNOSIS — J441 Chronic obstructive pulmonary disease with (acute) exacerbation: Secondary | ICD-10-CM | POA: Diagnosis not present

## 2020-07-09 DIAGNOSIS — I10 Essential (primary) hypertension: Secondary | ICD-10-CM | POA: Diagnosis not present

## 2020-07-09 DIAGNOSIS — E7800 Pure hypercholesterolemia, unspecified: Secondary | ICD-10-CM | POA: Diagnosis not present

## 2020-07-09 DIAGNOSIS — G4700 Insomnia, unspecified: Secondary | ICD-10-CM | POA: Diagnosis not present

## 2020-07-10 DIAGNOSIS — J449 Chronic obstructive pulmonary disease, unspecified: Secondary | ICD-10-CM | POA: Diagnosis not present

## 2020-08-04 DIAGNOSIS — E039 Hypothyroidism, unspecified: Secondary | ICD-10-CM | POA: Diagnosis not present

## 2020-08-04 DIAGNOSIS — E785 Hyperlipidemia, unspecified: Secondary | ICD-10-CM | POA: Diagnosis not present

## 2020-08-04 DIAGNOSIS — I10 Essential (primary) hypertension: Secondary | ICD-10-CM | POA: Diagnosis not present

## 2020-08-04 DIAGNOSIS — E7800 Pure hypercholesterolemia, unspecified: Secondary | ICD-10-CM | POA: Diagnosis not present

## 2020-08-04 DIAGNOSIS — G4700 Insomnia, unspecified: Secondary | ICD-10-CM | POA: Diagnosis not present

## 2020-08-04 DIAGNOSIS — J441 Chronic obstructive pulmonary disease with (acute) exacerbation: Secondary | ICD-10-CM | POA: Diagnosis not present

## 2020-08-04 DIAGNOSIS — J449 Chronic obstructive pulmonary disease, unspecified: Secondary | ICD-10-CM | POA: Diagnosis not present

## 2020-08-04 DIAGNOSIS — M810 Age-related osteoporosis without current pathological fracture: Secondary | ICD-10-CM | POA: Diagnosis not present

## 2020-08-10 DIAGNOSIS — J449 Chronic obstructive pulmonary disease, unspecified: Secondary | ICD-10-CM | POA: Diagnosis not present

## 2020-09-09 DIAGNOSIS — J449 Chronic obstructive pulmonary disease, unspecified: Secondary | ICD-10-CM | POA: Diagnosis not present

## 2020-09-22 DIAGNOSIS — J449 Chronic obstructive pulmonary disease, unspecified: Secondary | ICD-10-CM | POA: Diagnosis not present

## 2020-09-22 DIAGNOSIS — E039 Hypothyroidism, unspecified: Secondary | ICD-10-CM | POA: Diagnosis not present

## 2020-09-22 DIAGNOSIS — M810 Age-related osteoporosis without current pathological fracture: Secondary | ICD-10-CM | POA: Diagnosis not present

## 2020-09-22 DIAGNOSIS — I10 Essential (primary) hypertension: Secondary | ICD-10-CM | POA: Diagnosis not present

## 2020-09-22 DIAGNOSIS — E7800 Pure hypercholesterolemia, unspecified: Secondary | ICD-10-CM | POA: Diagnosis not present

## 2020-09-22 DIAGNOSIS — E785 Hyperlipidemia, unspecified: Secondary | ICD-10-CM | POA: Diagnosis not present

## 2020-09-22 DIAGNOSIS — G4700 Insomnia, unspecified: Secondary | ICD-10-CM | POA: Diagnosis not present

## 2020-09-22 DIAGNOSIS — J441 Chronic obstructive pulmonary disease with (acute) exacerbation: Secondary | ICD-10-CM | POA: Diagnosis not present

## 2020-10-10 DIAGNOSIS — J449 Chronic obstructive pulmonary disease, unspecified: Secondary | ICD-10-CM | POA: Diagnosis not present

## 2020-10-11 ENCOUNTER — Other Ambulatory Visit: Payer: Self-pay | Admitting: Internal Medicine

## 2020-10-16 DIAGNOSIS — E785 Hyperlipidemia, unspecified: Secondary | ICD-10-CM | POA: Diagnosis not present

## 2020-10-16 DIAGNOSIS — G4700 Insomnia, unspecified: Secondary | ICD-10-CM | POA: Diagnosis not present

## 2020-10-16 DIAGNOSIS — M810 Age-related osteoporosis without current pathological fracture: Secondary | ICD-10-CM | POA: Diagnosis not present

## 2020-10-16 DIAGNOSIS — E7800 Pure hypercholesterolemia, unspecified: Secondary | ICD-10-CM | POA: Diagnosis not present

## 2020-10-16 DIAGNOSIS — E039 Hypothyroidism, unspecified: Secondary | ICD-10-CM | POA: Diagnosis not present

## 2020-10-16 DIAGNOSIS — I10 Essential (primary) hypertension: Secondary | ICD-10-CM | POA: Diagnosis not present

## 2020-10-16 DIAGNOSIS — J441 Chronic obstructive pulmonary disease with (acute) exacerbation: Secondary | ICD-10-CM | POA: Diagnosis not present

## 2020-10-16 DIAGNOSIS — J449 Chronic obstructive pulmonary disease, unspecified: Secondary | ICD-10-CM | POA: Diagnosis not present

## 2020-11-02 ENCOUNTER — Emergency Department (HOSPITAL_COMMUNITY): Payer: Medicare HMO

## 2020-11-02 ENCOUNTER — Other Ambulatory Visit: Payer: Self-pay

## 2020-11-02 ENCOUNTER — Inpatient Hospital Stay (HOSPITAL_COMMUNITY)
Admission: EM | Admit: 2020-11-02 | Discharge: 2020-11-06 | DRG: 872 | Disposition: A | Discharge status: Home Health | Payer: Medicare HMO | Attending: Family Medicine | Admitting: Family Medicine

## 2020-11-02 DIAGNOSIS — Z20822 Contact with and (suspected) exposure to covid-19: Secondary | ICD-10-CM | POA: Diagnosis present

## 2020-11-02 DIAGNOSIS — I480 Paroxysmal atrial fibrillation: Secondary | ICD-10-CM | POA: Diagnosis not present

## 2020-11-02 DIAGNOSIS — Z9981 Dependence on supplemental oxygen: Secondary | ICD-10-CM

## 2020-11-02 DIAGNOSIS — N3091 Cystitis, unspecified with hematuria: Secondary | ICD-10-CM | POA: Diagnosis present

## 2020-11-02 DIAGNOSIS — A419 Sepsis, unspecified organism: Principal | ICD-10-CM | POA: Diagnosis present

## 2020-11-02 DIAGNOSIS — Z79890 Hormone replacement therapy (postmenopausal): Secondary | ICD-10-CM

## 2020-11-02 DIAGNOSIS — I083 Combined rheumatic disorders of mitral, aortic and tricuspid valves: Secondary | ICD-10-CM | POA: Diagnosis present

## 2020-11-02 DIAGNOSIS — I10 Essential (primary) hypertension: Secondary | ICD-10-CM | POA: Diagnosis present

## 2020-11-02 DIAGNOSIS — I4892 Unspecified atrial flutter: Secondary | ICD-10-CM | POA: Diagnosis present

## 2020-11-02 DIAGNOSIS — Z7951 Long term (current) use of inhaled steroids: Secondary | ICD-10-CM

## 2020-11-02 DIAGNOSIS — N12 Tubulo-interstitial nephritis, not specified as acute or chronic: Secondary | ICD-10-CM

## 2020-11-02 DIAGNOSIS — R0902 Hypoxemia: Secondary | ICD-10-CM | POA: Diagnosis not present

## 2020-11-02 DIAGNOSIS — R0602 Shortness of breath: Secondary | ICD-10-CM

## 2020-11-02 DIAGNOSIS — Z7901 Long term (current) use of anticoagulants: Secondary | ICD-10-CM

## 2020-11-02 DIAGNOSIS — E039 Hypothyroidism, unspecified: Secondary | ICD-10-CM | POA: Diagnosis present

## 2020-11-02 DIAGNOSIS — E785 Hyperlipidemia, unspecified: Secondary | ICD-10-CM | POA: Diagnosis present

## 2020-11-02 DIAGNOSIS — J449 Chronic obstructive pulmonary disease, unspecified: Secondary | ICD-10-CM | POA: Diagnosis present

## 2020-11-02 DIAGNOSIS — N10 Acute tubulo-interstitial nephritis: Secondary | ICD-10-CM | POA: Diagnosis present

## 2020-11-02 DIAGNOSIS — E1165 Type 2 diabetes mellitus with hyperglycemia: Secondary | ICD-10-CM | POA: Diagnosis present

## 2020-11-02 DIAGNOSIS — Z87891 Personal history of nicotine dependence: Secondary | ICD-10-CM

## 2020-11-02 DIAGNOSIS — Z7983 Long term (current) use of bisphosphonates: Secondary | ICD-10-CM

## 2020-11-02 DIAGNOSIS — J9611 Chronic respiratory failure with hypoxia: Secondary | ICD-10-CM | POA: Diagnosis present

## 2020-11-02 DIAGNOSIS — Z79899 Other long term (current) drug therapy: Secondary | ICD-10-CM

## 2020-11-02 DIAGNOSIS — D649 Anemia, unspecified: Secondary | ICD-10-CM | POA: Diagnosis present

## 2020-11-02 DIAGNOSIS — N179 Acute kidney failure, unspecified: Secondary | ICD-10-CM | POA: Diagnosis present

## 2020-11-02 DIAGNOSIS — R000 Tachycardia, unspecified: Secondary | ICD-10-CM | POA: Diagnosis not present

## 2020-11-02 DIAGNOSIS — E872 Acidosis: Secondary | ICD-10-CM | POA: Diagnosis present

## 2020-11-02 DIAGNOSIS — R7401 Elevation of levels of liver transaminase levels: Secondary | ICD-10-CM | POA: Diagnosis present

## 2020-11-02 DIAGNOSIS — R6520 Severe sepsis without septic shock: Secondary | ICD-10-CM | POA: Diagnosis present

## 2020-11-02 LAB — COMPREHENSIVE METABOLIC PANEL
ALT: 116 U/L — ABNORMAL HIGH (ref 0–44)
AST: 110 U/L — ABNORMAL HIGH (ref 15–41)
Albumin: 3.2 g/dL — ABNORMAL LOW (ref 3.5–5.0)
Alkaline Phosphatase: 93 U/L (ref 38–126)
Anion gap: 14 (ref 5–15)
BUN: 11 mg/dL (ref 8–23)
CO2: 27 mmol/L (ref 22–32)
Calcium: 9.3 mg/dL (ref 8.9–10.3)
Chloride: 98 mmol/L (ref 98–111)
Creatinine, Ser: 0.88 mg/dL (ref 0.44–1.00)
GFR, Estimated: >60 mL/min (ref >60)
Glucose, Bld: 169 mg/dL — ABNORMAL HIGH (ref 70–99)
Potassium: 4 mmol/L (ref 3.5–5.1)
Sodium: 139 mmol/L (ref 135–145)
Total Bilirubin: 0.6 mg/dL (ref 0.3–1.2)
Total Protein: 7.3 g/dL (ref 6.5–8.1)

## 2020-11-02 LAB — POC SARS CORONAVIRUS 2 AG -  ED: SARS Coronavirus 2 Ag: NEGATIVE

## 2020-11-02 LAB — CBC
HCT: 34.7 % — ABNORMAL LOW (ref 36.0–46.0)
Hemoglobin: 11.6 g/dL — ABNORMAL LOW (ref 12.0–15.0)
MCH: 29.6 pg (ref 26.0–34.0)
MCHC: 33.4 g/dL (ref 30.0–36.0)
MCV: 88.5 fL (ref 80.0–100.0)
Platelets: 243 10*3/uL (ref 150–400)
RBC: 3.92 MIL/uL (ref 3.87–5.11)
RDW: 12.9 % (ref 11.5–15.5)
WBC: 14.2 10*3/uL — ABNORMAL HIGH (ref 4.0–10.5)
nRBC: 0 % (ref 0.0–0.2)

## 2020-11-02 LAB — URINALYSIS, ROUTINE W REFLEX MICROSCOPIC
Bilirubin Urine: NEGATIVE
Glucose, UA: NEGATIVE mg/dL
Ketones, ur: 80 mg/dL — AB
Nitrite: NEGATIVE
Protein, ur: 100 mg/dL — AB
Specific Gravity, Urine: 1.013 (ref 1.005–1.030)
WBC, UA: >50 WBC/hpf — ABNORMAL HIGH (ref 0–5)
pH: 5 (ref 5.0–8.0)

## 2020-11-02 LAB — LIPASE, BLOOD: Lipase: 23 U/L (ref 11–51)

## 2020-11-02 MED ORDER — ACETAMINOPHEN 325 MG PO TABS
650.0000 mg | ORAL_TABLET | Freq: Once | ORAL | Status: AC | PRN
  Administered 2020-11-02: 22:00:00 650 mg via ORAL
  Filled 2020-11-02: qty 2

## 2020-11-02 NOTE — ED Notes (Signed)
Oxygen tank replaced °

## 2020-11-02 NOTE — ED Notes (Signed)
Triage RN Darci Current notified of temp

## 2020-11-02 NOTE — ED Notes (Signed)
Patients daughter wanted to add some symptoms; Lower back pain all over, sharp. Lower ab pain. Headaches. Felt nauseous, and frequent urination.

## 2020-11-02 NOTE — ED Notes (Signed)
TARA  [address] AN UPDATE OR WHEN DISCHARGED

## 2020-11-02 NOTE — ED Triage Notes (Signed)
Pt reports generalized body aches and abdominal pain since Friday night, with one episode of vomiting today. Wears 2L O2 at home d/t hx of COPD and emphysema.

## 2020-11-03 ENCOUNTER — Encounter (HOSPITAL_COMMUNITY): Payer: Self-pay | Admitting: Internal Medicine

## 2020-11-03 ENCOUNTER — Emergency Department (HOSPITAL_COMMUNITY): Payer: Medicare HMO

## 2020-11-03 DIAGNOSIS — Z7983 Long term (current) use of bisphosphonates: Secondary | ICD-10-CM | POA: Diagnosis not present

## 2020-11-03 DIAGNOSIS — Z79899 Other long term (current) drug therapy: Secondary | ICD-10-CM | POA: Diagnosis not present

## 2020-11-03 DIAGNOSIS — N10 Acute tubulo-interstitial nephritis: Secondary | ICD-10-CM | POA: Diagnosis not present

## 2020-11-03 DIAGNOSIS — N12 Tubulo-interstitial nephritis, not specified as acute or chronic: Secondary | ICD-10-CM | POA: Diagnosis not present

## 2020-11-03 DIAGNOSIS — D649 Anemia, unspecified: Secondary | ICD-10-CM | POA: Diagnosis present

## 2020-11-03 DIAGNOSIS — R109 Unspecified abdominal pain: Secondary | ICD-10-CM | POA: Diagnosis not present

## 2020-11-03 DIAGNOSIS — E872 Acidosis: Secondary | ICD-10-CM | POA: Diagnosis not present

## 2020-11-03 DIAGNOSIS — I4892 Unspecified atrial flutter: Secondary | ICD-10-CM | POA: Diagnosis not present

## 2020-11-03 DIAGNOSIS — Z79890 Hormone replacement therapy (postmenopausal): Secondary | ICD-10-CM | POA: Diagnosis not present

## 2020-11-03 DIAGNOSIS — Z7951 Long term (current) use of inhaled steroids: Secondary | ICD-10-CM | POA: Diagnosis not present

## 2020-11-03 DIAGNOSIS — R0602 Shortness of breath: Secondary | ICD-10-CM | POA: Diagnosis not present

## 2020-11-03 DIAGNOSIS — E785 Hyperlipidemia, unspecified: Secondary | ICD-10-CM | POA: Diagnosis not present

## 2020-11-03 DIAGNOSIS — N179 Acute kidney failure, unspecified: Secondary | ICD-10-CM | POA: Diagnosis not present

## 2020-11-03 DIAGNOSIS — A419 Sepsis, unspecified organism: Secondary | ICD-10-CM | POA: Diagnosis present

## 2020-11-03 DIAGNOSIS — R7401 Elevation of levels of liver transaminase levels: Secondary | ICD-10-CM | POA: Diagnosis present

## 2020-11-03 DIAGNOSIS — I480 Paroxysmal atrial fibrillation: Secondary | ICD-10-CM | POA: Diagnosis not present

## 2020-11-03 DIAGNOSIS — Z7901 Long term (current) use of anticoagulants: Secondary | ICD-10-CM | POA: Diagnosis not present

## 2020-11-03 DIAGNOSIS — J9611 Chronic respiratory failure with hypoxia: Secondary | ICD-10-CM

## 2020-11-03 DIAGNOSIS — E1165 Type 2 diabetes mellitus with hyperglycemia: Secondary | ICD-10-CM | POA: Diagnosis present

## 2020-11-03 DIAGNOSIS — J449 Chronic obstructive pulmonary disease, unspecified: Secondary | ICD-10-CM

## 2020-11-03 DIAGNOSIS — Z87891 Personal history of nicotine dependence: Secondary | ICD-10-CM | POA: Diagnosis not present

## 2020-11-03 DIAGNOSIS — I483 Typical atrial flutter: Secondary | ICD-10-CM | POA: Diagnosis not present

## 2020-11-03 DIAGNOSIS — N3091 Cystitis, unspecified with hematuria: Secondary | ICD-10-CM | POA: Diagnosis present

## 2020-11-03 DIAGNOSIS — E039 Hypothyroidism, unspecified: Secondary | ICD-10-CM | POA: Diagnosis not present

## 2020-11-03 DIAGNOSIS — Z9981 Dependence on supplemental oxygen: Secondary | ICD-10-CM | POA: Diagnosis not present

## 2020-11-03 DIAGNOSIS — Z20822 Contact with and (suspected) exposure to covid-19: Secondary | ICD-10-CM | POA: Diagnosis not present

## 2020-11-03 DIAGNOSIS — R6520 Severe sepsis without septic shock: Secondary | ICD-10-CM | POA: Diagnosis not present

## 2020-11-03 DIAGNOSIS — I10 Essential (primary) hypertension: Secondary | ICD-10-CM

## 2020-11-03 DIAGNOSIS — J439 Emphysema, unspecified: Secondary | ICD-10-CM | POA: Diagnosis not present

## 2020-11-03 DIAGNOSIS — R21 Rash and other nonspecific skin eruption: Secondary | ICD-10-CM | POA: Diagnosis not present

## 2020-11-03 LAB — APTT: aPTT: 36 seconds (ref 24–36)

## 2020-11-03 LAB — PROTIME-INR
INR: 1.2 (ref 0.8–1.2)
Prothrombin Time: 14.6 seconds (ref 11.4–15.2)

## 2020-11-03 LAB — LACTIC ACID, PLASMA
Lactic Acid, Venous: 1.5 mmol/L (ref 0.5–1.9)
Lactic Acid, Venous: 2.4 mmol/L (ref 0.5–1.9)
Lactic Acid, Venous: 1.5 mmol/L (ref 0.5–1.9)

## 2020-11-03 MED ORDER — LACTATED RINGERS IV BOLUS (SEPSIS)
1000.0000 mL | Freq: Once | INTRAVENOUS | Status: AC
  Administered 2020-11-03: 1000 mL via INTRAVENOUS

## 2020-11-03 MED ORDER — ENOXAPARIN SODIUM 40 MG/0.4ML ~~LOC~~ SOLN
40.0000 mg | SUBCUTANEOUS | Status: DC
  Administered 2020-11-03 – 2020-11-05 (×3): 40 mg via SUBCUTANEOUS
  Filled 2020-11-03 (×3): qty 0.4

## 2020-11-03 MED ORDER — ONDANSETRON HCL 4 MG PO TABS: 4.0000 mg | ORAL_TABLET | Freq: Four times a day (QID) | ORAL | Status: DC | PRN

## 2020-11-03 MED ORDER — SODIUM CHLORIDE 0.9 % IV SOLN
1.0000 g | INTRAVENOUS | Status: DC
  Administered 2020-11-04 – 2020-11-06 (×3): 1 g via INTRAVENOUS
  Filled 2020-11-03 (×3): qty 10

## 2020-11-03 MED ORDER — ASPIRIN EC 81 MG PO TBEC
81.0000 mg | DELAYED_RELEASE_TABLET | Freq: Every day | ORAL | Status: DC
  Administered 2020-11-03 – 2020-11-05 (×3): 81 mg via ORAL
  Filled 2020-11-03 (×3): qty 1

## 2020-11-03 MED ORDER — SODIUM CHLORIDE 0.9 % IV SOLN
1.0000 g | Freq: Once | INTRAVENOUS | Status: AC
  Administered 2020-11-03: 1 g via INTRAVENOUS
  Filled 2020-11-03: qty 10

## 2020-11-03 MED ORDER — ACETAMINOPHEN 650 MG RE SUPP: 650.0000 mg | Freq: Four times a day (QID) | RECTAL | Status: DC | PRN

## 2020-11-03 MED ORDER — ONDANSETRON HCL 4 MG/2ML IJ SOLN
4.0000 mg | Freq: Once | INTRAMUSCULAR | Status: AC
  Administered 2020-11-03: 4 mg via INTRAVENOUS
  Filled 2020-11-03: qty 2

## 2020-11-03 MED ORDER — UMECLIDINIUM-VILANTEROL 62.5-25 MCG/INH IN AEPB
1.0000 | INHALATION_SPRAY | Freq: Every day | RESPIRATORY_TRACT | Status: DC
  Administered 2020-11-05 – 2020-11-06 (×2): 1 via RESPIRATORY_TRACT
  Filled 2020-11-03 (×2): qty 14

## 2020-11-03 MED ORDER — ALBUTEROL SULFATE (2.5 MG/3ML) 0.083% IN NEBU: 2.5000 mg | INHALATION_SOLUTION | Freq: Four times a day (QID) | RESPIRATORY_TRACT | Status: DC | PRN

## 2020-11-03 MED ORDER — LACTATED RINGERS IV SOLN: INTRAVENOUS | Status: AC

## 2020-11-03 MED ORDER — MORPHINE SULFATE (PF) 2 MG/ML IV SOLN: 2.0000 mg | INTRAVENOUS | Status: DC | PRN

## 2020-11-03 MED ORDER — METOPROLOL TARTRATE 5 MG/5ML IV SOLN
2.5000 mg | Freq: Once | INTRAVENOUS | Status: AC
  Administered 2020-11-03: 2.5 mg via INTRAVENOUS
  Filled 2020-11-03: qty 5

## 2020-11-03 MED ORDER — HYDROCODONE-ACETAMINOPHEN 5-325 MG PO TABS
1.0000 | ORAL_TABLET | Freq: Four times a day (QID) | ORAL | Status: DC | PRN
  Administered 2020-11-03: 1 via ORAL
  Filled 2020-11-03: qty 1

## 2020-11-03 MED ORDER — IOHEXOL 300 MG/ML  SOLN
100.0000 mL | Freq: Once | INTRAMUSCULAR | Status: AC | PRN
  Administered 2020-11-03: 100 mL via INTRAVENOUS

## 2020-11-03 MED ORDER — SODIUM CHLORIDE 0.9% FLUSH
3.0000 mL | Freq: Two times a day (BID) | INTRAVENOUS | Status: DC
  Administered 2020-11-03 – 2020-11-06 (×5): 3 mL via INTRAVENOUS

## 2020-11-03 MED ORDER — ROSUVASTATIN CALCIUM 20 MG PO TABS
20.0000 mg | ORAL_TABLET | Freq: Every day | ORAL | Status: DC
  Administered 2020-11-03 – 2020-11-05 (×3): 20 mg via ORAL
  Filled 2020-11-03 (×3): qty 1

## 2020-11-03 MED ORDER — ALBUTEROL SULFATE (2.5 MG/3ML) 0.083% IN NEBU
2.5000 mg | INHALATION_SOLUTION | RESPIRATORY_TRACT | Status: DC | PRN
  Administered 2020-11-04: 2.5 mg via RESPIRATORY_TRACT
  Filled 2020-11-03: qty 3

## 2020-11-03 MED ORDER — LEVOTHYROXINE SODIUM 75 MCG PO TABS
75.0000 ug | ORAL_TABLET | Freq: Every day | ORAL | Status: DC
  Administered 2020-11-04 – 2020-11-06 (×3): 75 ug via ORAL
  Filled 2020-11-03 (×3): qty 1

## 2020-11-03 MED ORDER — SODIUM CHLORIDE 0.9 % IV BOLUS
500.0000 mL | Freq: Once | INTRAVENOUS | Status: AC
  Administered 2020-11-03: 500 mL via INTRAVENOUS

## 2020-11-03 MED ORDER — ATENOLOL 25 MG PO TABS
25.0000 mg | ORAL_TABLET | Freq: Every day | ORAL | Status: DC
  Administered 2020-11-03 – 2020-11-04 (×2): 25 mg via ORAL
  Filled 2020-11-03 (×2): qty 1

## 2020-11-03 MED ORDER — ACETAMINOPHEN 325 MG PO TABS
650.0000 mg | ORAL_TABLET | Freq: Four times a day (QID) | ORAL | Status: DC | PRN
  Administered 2020-11-03 – 2020-11-05 (×3): 650 mg via ORAL
  Filled 2020-11-03 (×3): qty 2

## 2020-11-03 MED ORDER — MIRTAZAPINE 15 MG PO TABS
15.0000 mg | ORAL_TABLET | Freq: Every day | ORAL | Status: DC
  Administered 2020-11-03 – 2020-11-05 (×3): 15 mg via ORAL
  Filled 2020-11-03 (×4): qty 1

## 2020-11-03 MED ORDER — ONDANSETRON HCL 4 MG/2ML IJ SOLN: 4.0000 mg | Freq: Four times a day (QID) | INTRAMUSCULAR | Status: DC | PRN

## 2020-11-03 NOTE — ED Provider Notes (Signed)
Physical Exam  BP 105/68 (BP Location: Left Arm)   Pulse (!) 109   Temp 98.1 F (36.7 C) (Oral)   Resp 15   SpO2 100%   Physical Exam Vitals and nursing note reviewed.  HENT:     Head: Normocephalic and atraumatic.     Mouth/Throat:     Mouth: Mucous membranes are moist.     Pharynx: No oropharyngeal exudate or posterior oropharyngeal erythema.  Eyes:     General:        Right eye: No discharge.        Left eye: No discharge.     Extraocular Movements: Extraocular movements intact.     Conjunctiva/sclera: Conjunctivae normal.     Pupils: Pupils are equal, round, and reactive to light.  Cardiovascular:     Rate and Rhythm: Regular rhythm. Tachycardia present.     Pulses: Normal pulses.     Heart sounds: Normal heart sounds. No murmur heard.   Pulmonary:     Effort: Pulmonary effort is normal. No accessory muscle usage, prolonged expiration, respiratory distress or retractions.     Breath sounds: Normal breath sounds. No wheezing or rales.     Comments: On 2 L supplemental O2  Chest:     Chest wall: No deformity, swelling, tenderness, crepitus or edema.  Abdominal:     General: Bowel sounds are normal. There is no distension.     Palpations: Abdomen is soft.     Tenderness: There is generalized abdominal tenderness.  Musculoskeletal:        General: No deformity.     Cervical back: Neck supple.     Right lower leg: No edema.     Left lower leg: No edema.  Skin:    General: Skin is warm and dry.     Capillary Refill: Capillary refill takes less than 2 seconds.  Neurological:     General: No focal deficit present.     Mental Status: She is alert and oriented to person, place, and time. Mental status is at baseline.     Cranial Nerves: Cranial nerves are intact.     Sensory: Sensation is intact.     Motor: Motor function is intact.     Gait: Gait is intact.  Psychiatric:        Mood and Affect: Mood normal.     ED Course/Procedures     Procedures  MDM    Care of this patient was assumed from preceding ED provider Martinique Robinson, PA-C at time of shift change.  Please see her note for more detailed HPI and ED course.  In brief 70 year old female presents with body aches and abdominal pain x4 days.  Pain started with generalized back pain gradually extended onto her abdomen.  Associated nausea and urinary symptoms.  CBC revealed leukocytosis, CMP with mild transaminitis, UA looks infectious. CT revealed left sided pyelonephritis and cystitis without abscess.  At time of handoff, patient is currently receiving IV antibiotics and fluid resuscitation.  She has been febrile and tachycardic throughout her stay in the emergency department.  If she remains tachycardic after administration of fluids antibiotics, will have low threshold to admit this patient to the hospital.  I personally evaluated the patient after administration of IV fluids and antibiotics.  She reports improvement in sensation of weakness, remains tachycardic to the 1 teens to 120s while at rest.  Given context of tachycardia in the setting of known infection, patient meet sepsis criteria, and I feel that she  would benefit from admission to the hospital at this time.  I discussed the disposition plan with the patient, who is amenable to admission at this time.  Consult placed to hospitalist, Dr. Tamala Julian, who is agreeable to seeing this patient and admitting her to his service.  He requested that this provider enact the code sepsis order set.  I appreciate his collaboration in the care of this patient.  This chart was dictated using voice recognition software, Dragon. Despite the best efforts of this provider to proofread and correct errors, errors may still occur which can change documentation meaning.      Aura Dials 11/03/20 [id number]    Sharlett Iles, MD 11/03/20 [phone]

## 2020-11-03 NOTE — ED Notes (Addendum)
Lab to add urine culture.  

## 2020-11-03 NOTE — Progress Notes (Signed)
Following for code sepsis 

## 2020-11-03 NOTE — ED Notes (Signed)
MD Tamala Julian made aware of pt HR in 130
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
## 2020-11-03 NOTE — ED Notes (Signed)
Dinner Tray Ordered @ 1651. 

## 2020-11-03 NOTE — Progress Notes (Signed)
Notified bedside nurse of need to draw repeat lactic acid @ 1400, also [place] to MD.

## 2020-11-03 NOTE — ED Notes (Signed)
Lunch Tray Ordered @ 1034. 
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
## 2020-11-04 ENCOUNTER — Inpatient Hospital Stay (HOSPITAL_COMMUNITY): Payer: Medicare HMO

## 2020-11-04 DIAGNOSIS — A419 Sepsis, unspecified organism: Principal | ICD-10-CM

## 2020-11-04 LAB — HEMOGLOBIN A1C
Hgb A1c MFr Bld: 6.6 % — ABNORMAL HIGH (ref 4.8–5.6)
Mean Plasma Glucose: 142.72 mg/dL

## 2020-11-04 LAB — COMPREHENSIVE METABOLIC PANEL
ALT: 82 U/L — ABNORMAL HIGH (ref 0–44)
AST: 66 U/L — ABNORMAL HIGH (ref 15–41)
Albumin: 2.5 g/dL — ABNORMAL LOW (ref 3.5–5.0)
Alkaline Phosphatase: 92 U/L (ref 38–126)
Anion gap: 11 (ref 5–15)
BUN: 11 mg/dL (ref 8–23)
CO2: 28 mmol/L (ref 22–32)
Calcium: 8.7 mg/dL — ABNORMAL LOW (ref 8.9–10.3)
Chloride: 99 mmol/L (ref 98–111)
Creatinine, Ser: 0.68 mg/dL (ref 0.44–1.00)
GFR, Estimated: >60 mL/min (ref >60)
Glucose, Bld: 122 mg/dL — ABNORMAL HIGH (ref 70–99)
Potassium: 4 mmol/L (ref 3.5–5.1)
Sodium: 138 mmol/L (ref 135–145)
Total Bilirubin: 0.7 mg/dL (ref 0.3–1.2)
Total Protein: 6.3 g/dL — ABNORMAL LOW (ref 6.5–8.1)

## 2020-11-04 LAB — CBC
HCT: 31.8 % — ABNORMAL LOW (ref 36.0–46.0)
Hemoglobin: 10.5 g/dL — ABNORMAL LOW (ref 12.0–15.0)
MCH: 30 pg (ref 26.0–34.0)
MCHC: 33 g/dL (ref 30.0–36.0)
MCV: 90.9 fL (ref 80.0–100.0)
Platelets: 240 10*3/uL (ref 150–400)
RBC: 3.5 MIL/uL — ABNORMAL LOW (ref 3.87–5.11)
RDW: 13.2 % (ref 11.5–15.5)
WBC: 12.3 10*3/uL — ABNORMAL HIGH (ref 4.0–10.5)
nRBC: 0 % (ref 0.0–0.2)

## 2020-11-04 LAB — URINE CULTURE

## 2020-11-04 LAB — TSH: TSH: 1.911 u[IU]/mL (ref 0.350–4.500)

## 2020-11-04 LAB — GLUCOSE, CAPILLARY
Glucose-Capillary: 128 mg/dL — ABNORMAL HIGH (ref 70–99)
Glucose-Capillary: 153 mg/dL — ABNORMAL HIGH (ref 70–99)

## 2020-11-04 LAB — CBG MONITORING, ED: Glucose-Capillary: 177 mg/dL — ABNORMAL HIGH (ref 70–99)

## 2020-11-04 LAB — LACTIC ACID, PLASMA: Lactic Acid, Venous: 1.1 mmol/L (ref 0.5–1.9)

## 2020-11-04 MED ORDER — DILTIAZEM HCL-DEXTROSE 125-5 MG/125ML-% IV SOLN (PREMIX)
5.0000 mg/h | INTRAVENOUS | Status: DC
  Administered 2020-11-05: 5 mg/h via INTRAVENOUS
  Filled 2020-11-04 (×2): qty 125

## 2020-11-04 MED ORDER — SODIUM CHLORIDE 0.9 % IV BOLUS
1000.0000 mL | Freq: Once | INTRAVENOUS | Status: AC
  Administered 2020-11-04: 1000 mL via INTRAVENOUS

## 2020-11-04 MED ORDER — INSULIN ASPART 100 UNIT/ML ~~LOC~~ SOLN
0.0000 [IU] | Freq: Three times a day (TID) | SUBCUTANEOUS | Status: DC
  Administered 2020-11-04: 1 [IU] via SUBCUTANEOUS
  Administered 2020-11-04 – 2020-11-05 (×2): 2 [IU] via SUBCUTANEOUS
  Administered 2020-11-05: 1 [IU] via SUBCUTANEOUS

## 2020-11-04 MED ORDER — LEVALBUTEROL HCL 0.63 MG/3ML IN NEBU: 0.6300 mg | INHALATION_SOLUTION | Freq: Four times a day (QID) | RESPIRATORY_TRACT | Status: DC | PRN

## 2020-11-04 MED ORDER — INSULIN ASPART 100 UNIT/ML ~~LOC~~ SOLN: 0.0000 [IU] | Freq: Every day | SUBCUTANEOUS | Status: DC

## 2020-11-04 NOTE — ED Notes (Signed)
Breakfast Ordered 

## 2020-11-04 NOTE — Progress Notes (Signed)
PROGRESS NOTE    [name]  XKG:[id number] DOB: 25-Oct-1950 DOA: 11/02/2020 PCP: Seward Carol, MD   Brief Narrative:  HPI: [name] is a 70 y.o. female with medical history significant of HTN, HLD, COPD, chronic respiratory failure on 2 L of oxygen, and hypothyroidism presents with complaints of worsening abdominal pain of the last 4 days.  Initially started as achiness in the lower part of her back.  Pain was noted to be sharp at times especially with any kind of movement.  It involved the left flank and abdomen as well.  Noted associated symptoms of nausea, one episode of vomiting, generalized whole body myalgias, chills, headache, frequency, and warm discomfort with urination that was new.  She normally has some shortness of breath and cough due to history of COPD, but reports that symptoms were worse than baseline.  Denies having any significant diarrhea, chest pain, or known fever.  Nothing had helped make symptoms better at home.  ED Course: Upon admission to the emergency department patient was seen to be febrile up to 101.3 F, pulse elevated at 136, and O2 saturation maintained on home 2 L of oxygen.  Labs significant for WBC 14.2, hemoglobin 11.6, glucose 169, lipase 23, AST 110, and ALT 116.  COVID-19 screening was negative. Urinalysis positive for large leukocytes, moderate hemoglobin CT scan of the abdomen pelvis significant for left pyelonephritis with cystitis.  Patient was started on empiric antibiotics of Rocephin.  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  Urinalysis  indicative of UTI.  CT scan of the abdomen pelvis significant for left pyelonephritis and cystitis with no abscess.  Patient's pain is improving. Little improvement in leukocytosis.  Lactic acidosis resolved.  Continue Rocephin and follow culture and tailor antibiotics accordingly.  COPD chronic respiratory failure: Patient chronically on 2 to 4 L of oxygen.  Chest x-ray significant for signs of COPD without active disease appreciated. -Continue nasal cannula oxygen 2 to 4 L -Continue home regimen of inhalers and breathing treatment regimen   Normocytic anemia: Chronic.  Hemoglobin is stable.  Essential hypertension: Blood pressure controlled. -Continue home regimen of atenolol 25 mg daily  Hyperglycemia with type 2 diabetes mellitus: Acute.  Glucose elevated up to 169 on admission.  Hemoglobin A1c 6.6 meeting criteria for type 2 diabetes mellitus.  Will start on SSI.  Transaminitis: Acute.  AST 110 and ALT 116 on admission.  Likely due to sepsis.  Numbers improving.  Hypothyroidism:  -Continue home regimen levothyroxine 75 mcg daily  Hyperlipidemia -Continue Crestor 20 mg nightly  DVT prophylaxis: enoxaparin (LOVENOX) injection 40 mg Start: 11/03/20 0845   Code Status: Full Code  Family Communication: None present at bedside.  Plan of care discussed with patient in length and he verbalized understanding and agreed with it.  Status is: Inpatient  Remains inpatient appropriate because:Inpatient level of care appropriate due to severity of illness   Dispo: The patient is from: Home              Anticipated d/c is to: Home  Anticipated d/c date is: 1 day              Patient currently is not medically stable to d/c.   Difficult to place patient No        Estimated body mass index is 19.67 kg/m as calculated from the following:   Height as of 06/02/20: 5' 1.5" (1.562 m).   Weight as of 06/02/20: 48 kg.      Nutritional status:                Consultants:   None  Procedures:   None  Antimicrobials:  Anti-infectives (From admission, onward)   Start     Dose/Rate Route Frequency Ordered Stop   11/04/20 0500  cefTRIAXone (ROCEPHIN) 1 g in sodium chloride 0.9 % 100 mL IVPB        1 g 200 mL/hr over 30 Minutes Intravenous Every 24 hours 11/03/20 1811     11/03/20 0515  cefTRIAXone (ROCEPHIN) 1 g in sodium chloride 0.9 % 100 mL IVPB        1 g 200 mL/hr over 30 Minutes Intravenous  Once 11/03/20 0512 11/03/20 0810         Subjective: Patient seen and examined.  She states that her abdominal pain is improving and she is improving overall.  With no new complaint.  Objective: Vitals:   11/04/20 0815 11/04/20 0930 11/04/20 0944 11/04/20 1100  BP: 125/70 128/63  102/61  Pulse: (!) 122 (!) 139 (!) 137 (!) 122  Resp: 17 19 (!) 21 (!) 26  Temp:      TempSrc:      SpO2: 100% 94% 98% 100%   No intake or output data in the 24 hours ending 11/04/20 1144 There were no vitals filed for this visit.  Examination:  General exam: Appears calm and comfortable but cachectic Respiratory system: Clear to auscultation. Respiratory effort normal. Cardiovascular system: S1 & S2 heard, RRR. No JVD, murmurs, rubs, gallops or clicks. No pedal edema. Gastrointestinal system: Abdomen is nondistended, soft and nontender. No organomegaly or masses felt. Normal bowel sounds heard.  Bilateral CVA tenderness, left greater than right Central nervous system: Alert and oriented. No focal neurological deficits. Extremities: Symmetric 5 x 5 power. Skin: No rashes, lesions or ulcers Psychiatry: Judgement and insight appear poor.   Data Reviewed: I have personally reviewed following labs and imaging studies  CBC: Recent Labs  Lab 11/02/20 1524 11/04/20 0513  WBC 14.2* 12.3*  HGB 11.6* 10.5*  HCT 34.7* 31.8*  MCV 88.5 90.9  PLT 243 588   Basic Metabolic Panel: Recent Labs  Lab 11/02/20 1524 11/04/20 0513  NA 139 138  K 4.0 4.0   CL 98 99  CO2 27 28  GLUCOSE 169* 122*  BUN 11 11  CREATININE 0.88 0.68  CALCIUM 9.3 8.7*   GFR: CrCl cannot be calculated (Unknown ideal weight.). Liver Function Tests: Recent Labs  Lab 11/02/20 1524 11/04/20 0513  AST 110* 66*  ALT 116* 82*  ALKPHOS 93 92  BILITOT 0.6 0.7  PROT 7.3 6.3*  ALBUMIN 3.2* 2.5*   Recent Labs  Lab 11/02/20 1524  LIPASE 23   No results for input(s): AMMONIA in the last 168 hours. Coagulation Profile: Recent Labs  Lab 11/03/20 0815  INR 1.2   Cardiac Enzymes: No results for input(s): CKTOTAL, CKMB, CKMBINDEX, TROPONINI in the last 168 hours. BNP (last 3 results) No results for input(s): PROBNP in the last 8760 hours. HbA1C: Recent Labs  11/04/20 0513  HGBA1C 6.6*   CBG: No results for input(s): GLUCAP in the last 168 hours. Lipid Profile: No results for input(s): CHOL, HDL, LDLCALC, TRIG, CHOLHDL, LDLDIRECT in the last 72 hours. Thyroid Function Tests: No results for input(s): TSH, T4TOTAL, FREET4, T3FREE, THYROIDAB in the last 72 hours. Anemia Panel: No results for input(s): VITAMINB12, FOLATE, FERRITIN, TIBC, IRON, RETICCTPCT in the last 72 hours. Sepsis Labs: Recent Labs  Lab 11/03/20 0815 11/03/20 1142 11/03/20 1458  LATICACIDVEN 1.5 2.4* 1.5    Recent Results (from the past 240 hour(s))  Urine culture     Status: Abnormal   Collection Time: 11/02/20  3:20 PM   Specimen: Urine, Random  Result Value Ref Range Status   Specimen Description URINE, RANDOM  Final   Special Requests   Final    NONE Performed at [facility], [place] [address] [place], [place] [address]    Culture MULTIPLE SPECIES PRESENT, SUGGEST RECOLLECTION (A)  Final   Report Status 11/04/2020 FINAL  Final  Blood Culture (routine x 2)     Status: None (Preliminary result)   Collection Time: 11/03/20  8:19 AM   Specimen: BLOOD  Result Value Ref Range Status   Specimen Description BLOOD LEFT ANTECUBITAL  Final   Special Requests   Final     BOTTLES DRAWN AEROBIC AND ANAEROBIC Blood Culture adequate volume   Culture   Final    NO GROWTH < 24 HOURS Performed at [facility], [place] [address], [place], [place] [address]    Report Status PENDING  Incomplete  Blood Culture (routine x 2)     Status: None (Preliminary result)   Collection Time: 11/03/20  8:22 AM   Specimen: BLOOD  Result Value Ref Range Status   Specimen Description BLOOD RIGHT ANTECUBITAL  Final   Special Requests   Final    BOTTLES DRAWN AEROBIC AND ANAEROBIC Blood Culture adequate volume   Culture   Final    NO GROWTH < 24 HOURS Performed at [facility], [place] [address] [place], [place] [address]    Report Status PENDING  Incomplete      Radiology Studies: DG Chest 2 View  Result Date: 11/02/2020 CLINICAL DATA:  Worsening shortness of breath for 4 days. Hypoxia. COPD. EXAM: CHEST - 2 VIEW COMPARISON:  10/04/2018 FINDINGS: The heart size and mediastinal contours are within normal limits. Aortic atherosclerotic calcification noted. Marked pulmonary hyperinflation is again seen, consistent with COPD. Both lungs are clear. The visualized skeletal structures are unremarkable. IMPRESSION: COPD. No active cardiopulmonary disease. Electronically Signed   By: Marlaine Hind M.D.   On: 11/02/2020 18:18   CT Abdomen Pelvis W Contrast  Result Date: 11/03/2020 CLINICAL DATA:  Abdominal pain acute, nonlocalized. EXAM: CT ABDOMEN AND PELVIS WITH CONTRAST TECHNIQUE: Multidetector CT imaging of the abdomen and pelvis was performed using the standard protocol following bolus administration of intravenous contrast. CONTRAST:  193m OMNIPAQUE IOHEXOL 300 MG/ML  SOLN COMPARISON:  None. FINDINGS: Lower chest: Suggestion of emphysematous changes. Hepatobiliary: No focal liver abnormality. No gallstones, gallbladder wall thickening, or pericholecystic fluid. No biliary dilatation. Pancreas: No focal lesion. Normal pancreatic contour. No surrounding inflammatory changes. No main  pancreatic ductal dilatation. Spleen: Normal in size without focal abnormality. Adrenals/Urinary Tract: No adrenal nodule bilaterally. Left striated nephrogram. The right kidney enhances homogeneous Lea. No abscess formation identified within the kidneys. Subcentimeter hypodensities are too small to characterize. No hydronephrosis. No hydroureter. No nephroureterolithiasis. Thickened urinary bladder wall  and hyperenhancing mucosa. On delayed imaging, there is no urothelial wall thickening and there are no filling defects in the opacified portions of the bilateral collecting systems or ureters. Stomach/Bowel: Stomach is within normal limits. No evidence of bowel wall thickening or dilatation. No pneumatosis. Scattered colonic diverticulosis. Appendix appears normal. Vascular/Lymphatic: No abdominal aorta or iliac aneurysm. Severe calcified and noncalcified atherosclerotic plaque of the aorta and its branches. No abdominal, pelvic, or inguinal lymphadenopathy. Reproductive: Uterus and bilateral adnexa are unremarkable. Other: No intraperitoneal free fluid. No intraperitoneal free gas. No organized fluid collection. Musculoskeletal: No abdominal wall hernia or abnormality No suspicious lytic or blastic osseous lesions. No acute displaced fracture. Intervertebral disc space narrowing and vacuum phenomenon as well as endplate sclerosis at the L4-L5 level. IMPRESSION: 1. Left pyelonephritis and cystitis with no abscess formation. 2.  Aortic Atherosclerosis (ICD10-I70.0). Electronically Signed   By: Iven Finn M.D.   On: 11/03/2020 04:56    Scheduled Meds: . aspirin EC  81 mg Oral Daily  . atenolol  25 mg Oral Daily  . enoxaparin (LOVENOX) injection  40 mg Subcutaneous Q24H  . levothyroxine  75 mcg Oral Daily  . mirtazapine  15 mg Oral QHS  . rosuvastatin  20 mg Oral QHS  . sodium chloride flush  3 mL Intravenous Q12H  . umeclidinium-vilanterol  1 puff Inhalation Daily   Continuous Infusions: .  cefTRIAXone (ROCEPHIN)  IV Stopped (11/04/20 0735)     LOS: 1 day   Time spent: 37 minutes   Darliss Cheney, MD [facility]  11/04/2020, 11:44 AM   To contact the attending provider between 7A-7P or the covering provider during after hours 7P-7A, please log into the web site [email or web address].

## 2020-11-04 NOTE — ED Notes (Signed)
Sister at bedside. Helping pt choose desired food from [facility].

## 2020-11-04 NOTE — ED Notes (Signed)
Attempted to call report. RN "on break"

## 2020-11-04 NOTE — Progress Notes (Addendum)
NEW ADMISSION NOTE  Arrival Method: bed Mental Orientation: Alert and oriented x4  Telemetry: yes Assessment: Completed Skin: see notes Iv: left forearm Pain: 5, patient refused medication for pain Tubes: 0 Safety Measures: Safety Fall Prevention Plan has been given, discussed and signed Admission: Completed [facility] Orientation: Patient has been orientated to the room, unit and staff.  Family: 0  Orders have been reviewed and implemented. Will continue to monitor the patient. Call light has been placed within reach and bed alarm has been activated.  Patient is unable to answer all admitting questions at this time and reported "that she is not feeling well and would like some rest".  When patient arrived to the unit, telemetry informed RN that patient's heart rate was as high as 155, MD has been notified and RN will continue to monitor this patient  Patient reported that they have been vaccinated for COVID last year, but she can not remember the exact date, she mentioned her sister has her vaccination cards and can bring them into the hospital when she comes to visit.  Beatris Ship, RN

## 2020-11-04 NOTE — Progress Notes (Addendum)
   11/04/20 1455  Assess: MEWS Score  Temp 99 F (37.2 C)  BP 116/73  Pulse Rate (!) 114  Resp 18  Level of Consciousness Alert  SpO2 100 %  O2 Device Nasal Cannula  Assess: MEWS Score  MEWS Temp 0  MEWS Systolic 0  MEWS Pulse 2  MEWS RR 0  MEWS LOC 0  MEWS Score 2  MEWS Score Color Yellow  Assess: if the MEWS score is Yellow or Red  Were vital signs taken at a resting state? Yes  Focused Assessment No change from prior assessment  Early Detection of Sepsis Score *See Row Information* High  MEWS guidelines implemented *See Row Information* Yes  Treat  Pain Scale 0-10  Pain Score 5 (patient refused medication)  Pain Type Acute pain  Pain Location Generalized  Pain Descriptors / Indicators Aching  Pain Frequency Intermittent  Pain Onset On-going  Patients Stated Pain Goal 0  Pain Intervention(s) Medication (See eMAR)  Multiple Pain Sites No  Take Vital Signs  Increase Vital Sign Frequency  Yellow: Q 2hr X 2 then Q 4hr X 2, if remains yellow, continue Q 4hrs  Escalate  MEWS: Escalate Yellow: discuss with charge nurse/RN and consider discussing with provider and RRT  Notify: Charge Nurse/RN  Name of Charge Nurse/RN Notified Durward Mallard  Date Charge Nurse/RN Notified 11/04/20  Time Charge Nurse/RN Notified 3007  Notify: Provider  Provider Name/Title Darliss Cheney  Date Provider Notified 11/04/20  Time Provider Notified 1533  Notification Type Page  Notification Reason Change in status  Response No new orders  Date of Provider Response 11/04/20  Time of Provider Response  (Still watiing for provider response)  Document  Patient Outcome Other (Comment) (RN continuing to monitor patient)  Progress note created (see row info) Yes  Yellow MEWS protocol, provider response 1553

## 2020-11-04 NOTE — Plan of Care (Signed)

## 2020-11-04 NOTE — Progress Notes (Addendum)
Floor coverage overnight event  Patient admitted for severe sepsis secondary to pyelonephritis and is being treated with ceftriaxone.  She also has COPD and uses 2 to 4 L home oxygen chronically.  Notified by RN that patient has been persistently tachycardic since the time of admission.  However, tachycardia became worse with heart rate in the 160s to 180s after she used the bedside commode.  She also complained of shortness of breath at that time and was wheezing.  RN gave her an albuterol nebulizer treatment.  Her blood pressure is stable at 119/72.  She was satting well on 2 L supplemental oxygen but it was increased to 4 L for comfort.  Per RN, no tachypnea or increased work of breathing.  Heart rate currently sustaining in the 160s. Notified by RN that patient stopped receiving maintenance IV fluids at 5 AM this morning.  -Stat EKG ordered -Previous echo done in April 2017 showing normal systolic function and grade 2 diastolic dysfunction.  Will order Stat chest x-ray, if no signs of volume overload, give IV fluid to see if heart rate responds. -TSH ordered yesterday 2/1 at 10:51 AM has not been collected.  I have requested RN to call phlebotomy to get labs collected. -Will switch albuterol to levalbuterol given tachycardia.  Continue Anoro Ellipta. -Patient is receiving her home atenolol 25 mg daily, last dose was at 11 AM today.  Addendum: Heart rate sustaining in the 160s.  EKG showing atrial flutter.  Will give IV fluid bolus and start Cardizem drip.  Repeat echocardiogram ordered.  EKG has been reviewed by on-call cardiologist Dr. De Nurse as well and he agrees with this treatment plan.  Feels that atrial flutter is likely precipitated by underlying infection.

## 2020-11-04 NOTE — Progress Notes (Signed)
   11/04/20 2044  Assess: MEWS Score  Pulse Rate (!) 162    Notified by telemetry that patient's HR is in the 160-170's. MD Rathore made aware. See new orders

## 2020-11-05 ENCOUNTER — Encounter (HOSPITAL_COMMUNITY): Payer: Self-pay | Admitting: Internal Medicine

## 2020-11-05 ENCOUNTER — Inpatient Hospital Stay (HOSPITAL_COMMUNITY): Payer: Medicare HMO

## 2020-11-05 DIAGNOSIS — I483 Typical atrial flutter: Secondary | ICD-10-CM

## 2020-11-05 DIAGNOSIS — I480 Paroxysmal atrial fibrillation: Secondary | ICD-10-CM

## 2020-11-05 LAB — CBC WITH DIFFERENTIAL/PLATELET
Abs Immature Granulocytes: 0.04 10*3/uL (ref 0.00–0.07)
Basophils Absolute: 0 10*3/uL (ref 0.0–0.1)
Basophils Relative: 0 %
Eosinophils Absolute: 0.1 10*3/uL (ref 0.0–0.5)
Eosinophils Relative: 2 %
HCT: 32.6 % — ABNORMAL LOW (ref 36.0–46.0)
Hemoglobin: 10.3 g/dL — ABNORMAL LOW (ref 12.0–15.0)
Immature Granulocytes: 1 %
Lymphocytes Relative: 16 %
Lymphs Abs: 1.3 10*3/uL (ref 0.7–4.0)
MCH: 28.8 pg (ref 26.0–34.0)
MCHC: 31.6 g/dL (ref 30.0–36.0)
MCV: 91.1 fL (ref 80.0–100.0)
Monocytes Absolute: 1.2 10*3/uL — ABNORMAL HIGH (ref 0.1–1.0)
Monocytes Relative: 15 %
Neutro Abs: 5.2 10*3/uL (ref 1.7–7.7)
Neutrophils Relative %: 66 %
Platelets: 263 10*3/uL (ref 150–400)
RBC: 3.58 MIL/uL — ABNORMAL LOW (ref 3.87–5.11)
RDW: 13 % (ref 11.5–15.5)
WBC: 7.8 10*3/uL (ref 4.0–10.5)
nRBC: 0 % (ref 0.0–0.2)

## 2020-11-05 LAB — ECHOCARDIOGRAM COMPLETE
Area-P 1/2: 3.91 cm2
Height: 61 in
S' Lateral: 2.9 cm
Weight: 1756.63 oz

## 2020-11-05 LAB — BASIC METABOLIC PANEL
Anion gap: 10 (ref 5–15)
BUN: 7 mg/dL — ABNORMAL LOW (ref 8–23)
CO2: 28 mmol/L (ref 22–32)
Calcium: 8.2 mg/dL — ABNORMAL LOW (ref 8.9–10.3)
Chloride: 102 mmol/L (ref 98–111)
Creatinine, Ser: 0.64 mg/dL (ref 0.44–1.00)
GFR, Estimated: >60 mL/min (ref >60)
Glucose, Bld: 135 mg/dL — ABNORMAL HIGH (ref 70–99)
Potassium: 3.7 mmol/L (ref 3.5–5.1)
Sodium: 140 mmol/L (ref 135–145)

## 2020-11-05 LAB — GLUCOSE, CAPILLARY
Glucose-Capillary: 123 mg/dL — ABNORMAL HIGH (ref 70–99)
Glucose-Capillary: 119 mg/dL — ABNORMAL HIGH (ref 70–99)
Glucose-Capillary: 138 mg/dL — ABNORMAL HIGH (ref 70–99)
Glucose-Capillary: 175 mg/dL — ABNORMAL HIGH (ref 70–99)
Glucose-Capillary: 148 mg/dL — ABNORMAL HIGH (ref 70–99)

## 2020-11-05 MED ORDER — APIXABAN 5 MG PO TABS
5.0000 mg | ORAL_TABLET | Freq: Two times a day (BID) | ORAL | Status: DC
  Administered 2020-11-05 – 2020-11-06 (×3): 5 mg via ORAL
  Filled 2020-11-05 (×3): qty 1

## 2020-11-05 MED ORDER — DILTIAZEM HCL 60 MG PO TABS
60.0000 mg | ORAL_TABLET | Freq: Four times a day (QID) | ORAL | Status: DC
  Administered 2020-11-05 – 2020-11-06 (×3): 60 mg via ORAL
  Filled 2020-11-05 (×3): qty 1

## 2020-11-05 NOTE — Progress Notes (Signed)
Report called to 3E and given report to RN Santiago Glad.
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
## 2020-11-05 NOTE — Progress Notes (Signed)
PROGRESS NOTE    [name]  JME:[id number] DOB: [birth year] DOA: 11/02/2020 PCP: Seward Carol, MD   Brief Narrative:  [name] is a 70 y.o. female with medical history significant of HTN, HLD, COPD, chronic respiratory failure on 2 L of oxygen, and hypothyroidism presented with complaints of worsening abdominal pain of the last 4 days. Upon admission to the emergency department patient was seen to be febrile up to 101.3 F, pulse elevated at 136, and O2 saturation maintained on home 2 L of oxygen.  Labs significant for WBC 14.2, hemoglobin 11.6, glucose 169, lipase 23, AST 110, and ALT 116.  COVID-19 screening was negative. Urinalysis positive for large leukocytes, moderate hemoglobin CT scan of the abdomen pelvis significant for left pyelonephritis with cystitis.  Patient was started on empiric antibiotics of Rocephin.  Late night of 11/04/2020, patient was diagnosed with new onset atrial flutter and is started on Cardizem drip.  Assessment & Plan:   Principal Problem:   Sepsis ([place]) Active Problems:   Essential hypertension   COPD GOLD III criteria but 02 dep   Hypothyroidism   Chronic respiratory failure with hypoxia ([facility])   Acute pyelonephritis   AKI (acute kidney injury) ([facility])   Normocytic anemia   Severe Sepsis secondary to pyelonephritis: Patient met severe sepsis criteria based on ever up to 101.3 F with tachycardia and tachypnea and lactic acid of 2.4.  Urinalysis indicative of UTI.  CT scan of the abdomen pelvis significant for left pyelonephritis and cystitis with no abscess.  Patient is improving.  Leukocytosis resolved.  Lactic acidosis resolved.  She is afebrile.  Blood culture negative so far.  Urine culture growing multiple species.  Will recollect for urine culture.  Continue Rocephin and follow culture and tailor antibiotics accordingly.  COPD chronic respiratory failure: Patient chronically on 2 to 4 L of oxygen.  Chest x-ray significant for signs of COPD  without active disease appreciated. -Continue nasal cannula oxygen 2 to 4 L -Continue home regimen of inhalers and breathing treatment regimen   Normocytic anemia: Chronic.  Hemoglobin is stable.  Essential hypertension: Blood pressure controlled. -Continue home regimen of atenolol 25 mg daily  Hyperglycemia with type 2 diabetes mellitus: Acute.  Glucose elevated up to 169 on admission.  Hemoglobin A1c 6.6 meeting criteria for type 2 diabetes mellitus.  Blood sugar controlled.  Continue SSI.  Transaminitis: Acute.  AST 110 and ALT 116 on admission.  Likely due to sepsis.  Numbers improving.  Hypothyroidism:  -Continue home regimen levothyroxine 75 mcg daily  Hyperlipidemia -Continue Crestor 20 mg nightly  Atrial flutter/atrial fibrillation: On the night of 2-20 22, patient was noted to have high heart rate.  EKG was done which showed new onset atrial flutter with rates around 160.  Patient was started on Cardizem drip by hospitalist service.  Cardiology was curb sided.  This morning, her rates are around 80.  She still remains in atrial fibrillation but has no chest pain, palpitation or shortness of breath.  She is on Cardizem.  We will wean it down.  She was not started on anticoagulation.  I have formally consulted cardiology.  Will defer to them.  DVT prophylaxis: enoxaparin (LOVENOX) injection 40 mg Start: 11/03/20 0845   Code Status: Full Code  Family Communication: None present at bedside.  Plan of care discussed with patient in length and he verbalized understanding and agreed with it.  Status is: Inpatient  Remains inpatient appropriate because:Inpatient level of care appropriate due to severity of illness  Dispo: The patient is from: Home              Anticipated d/c is to: Home              Anticipated d/c date is: 1 day              Patient currently is not medically stable to d/c.   Difficult to place patient No        Estimated body mass index is 20.74  kg/m as calculated from the following:   Height as of this encounter: _0  (1.549 m).   Weight as of this encounter: 49.8 kg.      Nutritional status:               Consultants:   Cardiology  Procedures:   None  Antimicrobials:  Anti-infectives (From admission, onward)   Start     Dose/Rate Route Frequency Ordered Stop   11/04/20 0500  cefTRIAXone (ROCEPHIN) 1 g in sodium chloride 0.9 % 100 mL IVPB        1 g 200 mL/hr over 30 Minutes Intravenous Every 24 hours 11/03/20 1811     11/03/20 0515  cefTRIAXone (ROCEPHIN) 1 g in sodium chloride 0.9 % 100 mL IVPB        1 g 200 mL/hr over 30 Minutes Intravenous  Once 11/03/20 0512 11/03/20 0810         Subjective: Seen and examined.  She states that she is feeling much better.  No abdominal pain or any other complaint.  Denied any chest pain or shortness of breath.  Objective: Vitals:   11/05/20 0730 11/05/20 0732 11/05/20 0753 11/05/20 0753  BP: 120/70 120/70 (!) 122/54 (!) 122/54  Pulse: 98 98 (!) 101 (!) 101  Resp: _1 Temp:  98.7 F (37.1 C) 98.7 F (37.1 C) 98.7 F (37.1 C)  TempSrc:    Oral  SpO2:    100%  Weight:      Height:        Intake/Output Summary (Last 24 hours) at 11/05/2020 1055 Last data filed at 11/05/2020 0700 Gross per 24 hour  Intake 631.77 ml  Output --  Net 631.77 ml   Filed Weights   11/04/20 1455 11/05/20 0100  Weight: 49 kg 49.8 kg    Examination:  General exam: Appears calm and comfortable  Respiratory system: Clear to auscultation. Respiratory effort normal. Cardiovascular system: S1 & S2 heard, irregularly irregular rate and rhythm. No JVD, murmurs, rubs, gallops or clicks. No pedal edema. Gastrointestinal system: Abdomen is nondistended, soft and nontender. No organomegaly or masses felt. Normal bowel sounds heard.  Bilateral CVA tenderness, equal but improved compared to yesterday Central nervous system: Alert and oriented. No focal neurological  deficits. Extremities: Symmetric 5 x 5 power. Skin: No rashes, lesions or ulcers.  Psychiatry: Judgement and insight appear normal. Mood & affect appropriate.   Data Reviewed: I have personally reviewed following labs and imaging studies  CBC: Recent Labs  Lab 11/02/20 1524 11/04/20 0513 11/05/20 0828  WBC 14.2* 12.3* 7.8  NEUTROABS  --   --  5.2  HGB 11.6* 10.5* 10.3*  HCT 34.7* 31.8* 32.6*  MCV 88.5 90.9 91.1  PLT 243 240 797   Basic Metabolic Panel: Recent Labs  Lab 11/02/20 1524 11/04/20 0513 11/05/20 0828  NA 139 138 140  K 4.0 4.0 3.7  CL 98 99 102  CO2 _2 GLUCOSE 169* 122* 135*  BUN  11 11 7*  CREATININE 0.88 0.68 0.64  CALCIUM 9.3 8.7* 8.2*   GFR: Estimated Creatinine Clearance: 48.7 mL/min (by C-G formula based on SCr of 0.64 mg/dL). Liver Function Tests: Recent Labs  Lab 11/02/20 1524 11/04/20 0513  AST 110* 66*  ALT 116* 82*  ALKPHOS 93 92  BILITOT 0.6 0.7  PROT 7.3 6.3*  ALBUMIN 3.2* 2.5*   Recent Labs  Lab 11/02/20 1524  LIPASE 23   No results for input(s): AMMONIA in the last 168 hours. Coagulation Profile: Recent Labs  Lab 11/03/20 0815  INR 1.2   Cardiac Enzymes: No results for input(s): CKTOTAL, CKMB, CKMBINDEX, TROPONINI in the last 168 hours. BNP (last 3 results) No results for input(s): PROBNP in the last 8760 hours. HbA1C: Recent Labs    11/04/20 0513  HGBA1C 6.6*   CBG: Recent Labs  Lab 11/04/20 1155 11/04/20 1718 11/04/20 2200 11/05/20 0634 11/05/20 0755  GLUCAP 177* 128* 153* 123* 119*   Lipid Profile: No results for input(s): CHOL, HDL, LDLCALC, TRIG, CHOLHDL, LDLDIRECT in the last 72 hours. Thyroid Function Tests: Recent Labs    11/04/20 2225  TSH 1.911   Anemia Panel: No results for input(s): VITAMINB12, FOLATE, FERRITIN, TIBC, IRON, RETICCTPCT in the last 72 hours. Sepsis Labs: Recent Labs  Lab 11/03/20 0815 11/03/20 1142 11/03/20 1458 11/04/20 2225  LATICACIDVEN 1.5 2.4* 1.5 1.1     Recent Results (from the past 240 hour(s))  Urine culture     Status: Abnormal   Collection Time: 11/02/20  3:20 PM   Specimen: Urine, Random  Result Value Ref Range Status   Specimen Description URINE, RANDOM  Final   Special Requests   Final    NONE Performed at [facility], [place] [address] [place], [place] [address]    Culture MULTIPLE SPECIES PRESENT, SUGGEST RECOLLECTION (A)  Final   Report Status 11/04/2020 FINAL  Final  Blood Culture (routine x 2)     Status: None (Preliminary result)   Collection Time: 11/03/20  8:19 AM   Specimen: BLOOD  Result Value Ref Range Status   Specimen Description BLOOD LEFT ANTECUBITAL  Final   Special Requests   Final    BOTTLES DRAWN AEROBIC AND ANAEROBIC Blood Culture adequate volume   Culture   Final    NO GROWTH 2 DAYS Performed at [facility], [place] [address], [place], [place] [address]    Report Status PENDING  Incomplete  Blood Culture (routine x 2)     Status: None (Preliminary result)   Collection Time: 11/03/20  8:22 AM   Specimen: BLOOD  Result Value Ref Range Status   Specimen Description BLOOD RIGHT ANTECUBITAL  Final   Special Requests   Final    BOTTLES DRAWN AEROBIC AND ANAEROBIC Blood Culture adequate volume   Culture   Final    NO GROWTH 2 DAYS Performed at [facility], [place] [address] [place], [place] [address]    Report Status PENDING  Incomplete      Radiology Studies: DG CHEST PORT 1 VIEW  Result Date: 11/04/2020 CLINICAL DATA:  70 year old female with shortness of breath. EXAM: PORTABLE CHEST 1 VIEW COMPARISON:  Chest radiograph dated 11/02/2020 FINDINGS: Emphysema. No focal consolidation, pleural effusion or pneumothorax. The cardiac silhouette is within limits. No acute osseous pathology. Atherosclerotic calcification of the aorta. IMPRESSION: No active cardiopulmonary disease. Electronically Signed   By: Anner Crete M.D.   On: 11/04/2020 22:04    Scheduled Meds: .  aspirin EC  81  mg Oral Daily  . enoxaparin (LOVENOX) injection  40 mg Subcutaneous Q24H  . insulin aspart  0-5 Units Subcutaneous QHS  . insulin aspart  0-9 Units Subcutaneous TID WC  . levothyroxine  75 mcg Oral Daily  . mirtazapine  15 mg Oral QHS  . rosuvastatin  20 mg Oral QHS  . sodium chloride flush  3 mL Intravenous Q12H  . umeclidinium-vilanterol  1 puff Inhalation Daily   Continuous Infusions: . cefTRIAXone (ROCEPHIN)  IV 1 g (11/05/20 0524)  . diltiazem (CARDIZEM) infusion 10 mg/hr (11/05/20 0657)     LOS: 2 days   Time spent: 31 minutes   Darliss Cheney, MD [facility]  11/05/2020, 10:55 AM   To contact the attending provider between 7A-7P or the covering provider during after hours 7P-7A, please log into the web site [email or web address].

## 2020-11-05 NOTE — Evaluation (Addendum)
Occupational Therapy Evaluation Patient Details Name: [name] MRN: [id number] DOB: 10/05/1950 Today's Date: 11/05/2020    History of Present Illness 70 y.o. female with medical history significant of HTN, HLD, COPD, chronic respiratory failure on 2 L of oxygen, and hypothyroidism presents with complaints of worsening abdominal pain.  Sepsis secondary to pyelonephritis.  CT scan of the abdomen pelvis significant for left pyelonephritis and cystitis with no abscess.   Clinical Impression   Patient admitted with the above diagnosis.  PTA she lived with her sister and shared many of the household and meal prep duties.  She no longer drives, but cares for her own bills and meds.  Currently, due to the deficits listed, needs up to setup and Min Guard for ADL and toileting skills.  The patient is requesting a wheelchair for community mobility and long distances, which given her respiratory status, seems reasonable.  OT will continue to follow her in the acute setting to maximize her functional status, and assist with an eventual return home.      Follow Up Recommendations  No OT follow up;Supervision - Intermittent    Equipment Recommendations  Wheelchair (18x18");Wheelchair cushion [phone]    Recommendations for Other Services       Precautions / Restrictions Precautions Precautions: Fall Restrictions Weight Bearing Restrictions: No      Mobility Bed Mobility Overal bed mobility: Modified Independent                  Transfers Overall transfer level: Needs assistance   Transfers: Sit to/from Stand;Stand Pivot Transfers Sit to Stand: Supervision Stand pivot transfers: Min guard       General transfer comment: patient with LOB backwards with initial stand.    Balance Overall balance assessment: Needs assistance Sitting-balance support: No upper extremity supported;Feet supported Sitting balance-Leahy Scale: Good     Standing balance support: Single extremity  supported Standing balance-Leahy Scale: Fair Standing balance comment: Reaching for objects in her environment.                           ADL either performed or assessed with clinical judgement   ADL Overall ADL's : Needs assistance/impaired     Grooming: Wash/dry hands;Min guard;Standing           Upper Body Dressing : Set up;Sitting   Lower Body Dressing: Min guard;Sit to/from stand Lower Body Dressing Details (indicate cue type and reason): LOB backwards             Functional mobility during ADLs: Min guard       Vision Patient Visual Report: No change from baseline       Perception     Praxis      Pertinent Vitals/Pain Pain Assessment: No/denies pain     Hand Dominance Right   Extremity/Trunk Assessment Upper Extremity Assessment Upper Extremity Assessment: Generalized weakness   Lower Extremity Assessment Lower Extremity Assessment: Defer to PT evaluation   Cervical / Trunk Assessment Cervical / Trunk Assessment: Normal   Communication Communication Communication: No difficulties   Cognition Arousal/Alertness: Awake/alert Behavior During Therapy: WFL for tasks assessed/performed Overall Cognitive Status: Within Functional Limits for tasks assessed  Home Living Family/patient expects to be discharged to:: Private residence Living Arrangements: Other relatives Available Help at Discharge: Family;Available 24 hours/day Type of Home: House       Home Layout: One level     Bathroom Shower/Tub: Teacher, early years/pre: Standard     Home Equipment: Wheelchair - manual   Additional Comments: Home O2 with a concentrator and long O2 cord.      Prior Functioning/Environment Level of Independence: Independent                 OT Problem List: Decreased activity tolerance;Impaired balance (sitting and/or standing)      OT  Treatment/Interventions: Self-care/ADL training;Therapeutic exercise;Balance training;Therapeutic activities    OT Goals(Current goals can be found in the care plan section) Acute Rehab OT Goals Patient Stated Goal: I want to do things for myself OT Goal Formulation: With patient Time For Goal Achievement: 11/19/20 Potential to Achieve Goals: Good  OT Frequency: Min 2X/week   Barriers to D/C:    none noted       Co-evaluation              AM-PAC OT "6 Clicks" Daily Activity     Outcome Measure Help from another person eating meals?: None Help from another person taking care of personal grooming?: None Help from another person toileting, which includes using toliet, bedpan, or urinal?: A Little Help from another person bathing (including washing, rinsing, drying)?: A Little Help from another person to put on and taking off regular upper body clothing?: None Help from another person to put on and taking off regular lower body clothing?: A Little 6 Click Score: 21   End of Session Equipment Utilized During Treatment: Oxygen;Gait belt  Activity Tolerance: Patient limited by fatigue Patient left: in bed;with call bell/phone within reach  OT Visit Diagnosis: Unsteadiness on feet (R26.81)                Time: 2563-8937 OT Time Calculation (min): 16 min Charges:  OT General Charges $OT Visit: 1 Visit OT Evaluation $OT Eval Moderate Complexity: 1 Mod  11/05/2020  Rich, OTR/L  Acute Rehabilitation Services  Office:  [phone]   Metta Clines 11/05/2020, 3:03 PM

## 2020-11-05 NOTE — Progress Notes (Signed)
Patient transferred to [facility] via regular bed together with all  her belongings. Patient on 3L O2 via [place], saturating well.

## 2020-11-05 NOTE — Discharge Instructions (Signed)

## 2020-11-05 NOTE — Evaluation (Signed)
Physical Therapy Evaluation Patient Details Name: [name] MRN: [id number] DOB: [birth year] Today's Date: 11/05/2020   History of Present Illness  70 y.o. female with medical history significant of HTN, HLD, COPD, chronic respiratory failure on 2 L of oxygen, and hypothyroidism presents with complaints of worsening abdominal pain.  Sepsis secondary to pyelonephritis.  CT scan of the abdomen pelvis significant for left pyelonephritis and cystitis with no abscess.  Clinical Impression  Pt was seen for progression of gait and to assess tolerances with sats and O2 use.  Her O2 remained above 90% with all gait on 3L O2, and was mildly SOB with two short walks.  Talked with her about having therapy follow her home, and is comfortable with that idea.  Has family with her and will be able to keep pretty much 24/7 help to support all moving, and is requesting per OT note a wc.  Has a very old chair that belonged to another person, and will need something in better condition to use for longer trips currently.  See for acute PT goals of therapy, and focus on her balance and endurance to reduce fall risk and O2 consumption for the activities done standing.    Follow Up Recommendations Home health PT;Supervision for mobility/OOB    Equipment Recommendations  None recommended by PT    Recommendations for Other Services       Precautions / Restrictions Precautions Precautions: Fall Precaution Comments: monitor O2 sats, on O2 at home Restrictions Weight Bearing Restrictions: No      Mobility  Bed Mobility Overal bed mobility: Needs Assistance Bed Mobility: Supine to Sit;Sit to Supine     Supine to sit: Supervision Sit to supine: Supervision   General bed mobility comments: Supervised for lines and to monitor safety    Transfers Overall transfer level: Needs assistance Equipment used: 1 person hand held assist Transfers: Sit to/from Stand Sit to Stand: Min guard Stand pivot transfers:  Min guard       General transfer comment: min guard for safety and to steady intially  Ambulation/Gait Ambulation/Gait assistance: Min guard Gait Distance (Feet): 70 Feet (35X 2) Assistive device: 1 person hand held assist;IV Pole Gait Pattern/deviations: Step-through pattern;Decreased stride length;Narrow base of support Gait velocity: reduced Gait velocity interpretation: <1.31 ft/sec, indicative of household ambulator General Gait Details: min guard for safety and control of balance, monitoring vitals esp sats  Stairs            Wheelchair Mobility    Modified Rankin (Stroke Patients Only)       Balance Overall balance assessment: Needs assistance Sitting-balance support: Feet supported Sitting balance-Leahy Scale: Good     Standing balance support: Single extremity supported Standing balance-Leahy Scale: Fair Standing balance comment: less than fair at times walking                             Pertinent Vitals/Pain Pain Assessment: Faces Faces Pain Scale: No hurt    Home Living Family/patient expects to be discharged to:: Private residence Living Arrangements: Other relatives Available Help at Discharge: Family;Available 24 hours/day Type of Home: House Home Access: Stairs to enter Entrance Stairs-Rails: None Entrance Stairs-Number of Steps: 1 Home Layout: One level Home Equipment: Wheelchair - [employer] - 4 wheels;Shower seat Additional Comments: Home O2 with a concentrator and long O2 cord.    Prior Function Level of Independence: Independent         Comments: no recent falls  Hand Dominance   Dominant Hand: Right    Extremity/Trunk Assessment   Upper Extremity Assessment Upper Extremity Assessment: Defer to OT evaluation    Lower Extremity Assessment Lower Extremity Assessment: Overall WFL for tasks assessed    Cervical / Trunk Assessment Cervical / Trunk Assessment: Normal  Communication   Communication: No  difficulties  Cognition Arousal/Alertness: Awake/alert Behavior During Therapy: WFL for tasks assessed/performed Overall Cognitive Status: Within Functional Limits for tasks assessed                                        General Comments General comments (skin integrity, edema, etc.): Pt was seen for controlled walk, making progress with IV pole and cues for postural corrections    Exercises     Assessment/Plan    PT Assessment Patient needs continued PT services  PT Problem List Decreased strength;Decreased activity tolerance;Decreased balance;Decreased mobility;Cardiopulmonary status limiting activity;Decreased safety awareness       PT Treatment Interventions DME instruction;Gait training;Stair training;Functional mobility training;Therapeutic activities;Therapeutic exercise;Balance training;Neuromuscular re-education;Patient/family education    PT Goals (Current goals can be found in the Care Plan section)  Acute Rehab PT Goals Patient Stated Goal: to get home with family and feel better PT Goal Formulation: With patient Time For Goal Achievement: 11/12/20 Potential to Achieve Goals: Good    Frequency Min 3X/week   Barriers to discharge Other (comment) has family and equipment, will need therapy    Co-evaluation               AM-PAC PT "6 Clicks" Mobility  Outcome Measure Help needed turning from your back to your side while in a flat bed without using bedrails?: None Help needed moving from lying on your back to sitting on the side of a flat bed without using bedrails?: A Little Help needed moving to and from a bed to a chair (including a wheelchair)?: A Little Help needed standing up from a chair using your arms (e.g., wheelchair or bedside chair)?: A Little Help needed to walk in hospital room?: A Little Help needed climbing 3-5 steps with a railing? : A Lot 6 Click Score: 18    End of Session Equipment Utilized During Treatment: Gait  belt;Oxygen Activity Tolerance: Patient tolerated treatment well;Treatment limited secondary to medical complications (Comment) Patient left: in bed;with call bell/phone within reach;with bed alarm set Nurse Communication: Mobility status PT Visit Diagnosis: Unsteadiness on feet (R26.81);Difficulty in walking, not elsewhere classified (R26.2)    Time: 4967-5916 PT Time Calculation (min) (ACUTE ONLY): 24 min   Charges:   PT Evaluation $PT Eval Moderate Complexity: 1 Mod PT Treatments $Gait Training: 8-22 mins       Ramond Dial 11/05/2020, 5:44 PM  Mee Hives, PT MS Acute Rehab Dept. Number: [place]

## 2020-11-05 NOTE — Progress Notes (Signed)
  Echocardiogram 2D Echocardiogram has been performed.  [name] 11/05/2020, 2:02 PM

## 2020-11-06 ENCOUNTER — Other Ambulatory Visit (HOSPITAL_COMMUNITY): Payer: Self-pay | Admitting: Family Medicine

## 2020-11-06 DIAGNOSIS — I480 Paroxysmal atrial fibrillation: Secondary | ICD-10-CM | POA: Diagnosis not present

## 2020-11-06 DIAGNOSIS — A419 Sepsis, unspecified organism: Secondary | ICD-10-CM | POA: Diagnosis not present

## 2020-11-06 DIAGNOSIS — R531 Weakness: Secondary | ICD-10-CM | POA: Diagnosis not present

## 2020-11-06 DIAGNOSIS — I483 Typical atrial flutter: Secondary | ICD-10-CM | POA: Diagnosis not present

## 2020-11-06 DIAGNOSIS — J449 Chronic obstructive pulmonary disease, unspecified: Secondary | ICD-10-CM | POA: Diagnosis not present

## 2020-11-06 LAB — CBC WITH DIFFERENTIAL/PLATELET
Abs Immature Granulocytes: 0.05 10*3/uL (ref 0.00–0.07)
Basophils Absolute: 0 10*3/uL (ref 0.0–0.1)
Basophils Relative: 0 %
Eosinophils Absolute: 0.2 10*3/uL (ref 0.0–0.5)
Eosinophils Relative: 3 %
HCT: 27.8 % — ABNORMAL LOW (ref 36.0–46.0)
Hemoglobin: 9.4 g/dL — ABNORMAL LOW (ref 12.0–15.0)
Immature Granulocytes: 1 %
Lymphocytes Relative: 21 %
Lymphs Abs: 1.7 10*3/uL (ref 0.7–4.0)
MCH: 29.8 pg (ref 26.0–34.0)
MCHC: 33.8 g/dL (ref 30.0–36.0)
MCV: 88.3 fL (ref 80.0–100.0)
Monocytes Absolute: 1.2 10*3/uL — ABNORMAL HIGH (ref 0.1–1.0)
Monocytes Relative: 15 %
Neutro Abs: 4.8 10*3/uL (ref 1.7–7.7)
Neutrophils Relative %: 60 %
Platelets: 260 10*3/uL (ref 150–400)
RBC: 3.15 MIL/uL — ABNORMAL LOW (ref 3.87–5.11)
RDW: 13 % (ref 11.5–15.5)
WBC: 7.9 10*3/uL (ref 4.0–10.5)
nRBC: 0 % (ref 0.0–0.2)

## 2020-11-06 LAB — GLUCOSE, CAPILLARY
Glucose-Capillary: 133 mg/dL — ABNORMAL HIGH (ref 70–99)
Glucose-Capillary: 117 mg/dL — ABNORMAL HIGH (ref 70–99)

## 2020-11-06 LAB — COMPREHENSIVE METABOLIC PANEL
ALT: 117 U/L — ABNORMAL HIGH (ref 0–44)
AST: 98 U/L — ABNORMAL HIGH (ref 15–41)
Albumin: 2.3 g/dL — ABNORMAL LOW (ref 3.5–5.0)
Alkaline Phosphatase: 112 U/L (ref 38–126)
Anion gap: 11 (ref 5–15)
BUN: <5 mg/dL — ABNORMAL LOW (ref 8–23)
CO2: 30 mmol/L (ref 22–32)
Calcium: 8.7 mg/dL — ABNORMAL LOW (ref 8.9–10.3)
Chloride: 102 mmol/L (ref 98–111)
Creatinine, Ser: 0.59 mg/dL (ref 0.44–1.00)
GFR, Estimated: >60 mL/min (ref >60)
Glucose, Bld: 146 mg/dL — ABNORMAL HIGH (ref 70–99)
Potassium: 3.2 mmol/L — ABNORMAL LOW (ref 3.5–5.1)
Sodium: 143 mmol/L (ref 135–145)
Total Bilirubin: 0.2 mg/dL — ABNORMAL LOW (ref 0.3–1.2)
Total Protein: 5.8 g/dL — ABNORMAL LOW (ref 6.5–8.1)

## 2020-11-06 LAB — URINE CULTURE: Culture: <10000 — AB

## 2020-11-06 MED ORDER — LEVOFLOXACIN 750 MG PO TABS: 750.0000 mg | ORAL_TABLET | Freq: Every day | ORAL | 0 refills | Status: DC

## 2020-11-06 MED ORDER — DILTIAZEM HCL ER COATED BEADS 120 MG PO CP24: 120.0000 mg | ORAL_CAPSULE | Freq: Every day | ORAL | 0 refills | Status: DC

## 2020-11-06 MED ORDER — POTASSIUM CHLORIDE CRYS ER 20 MEQ PO TBCR
40.0000 meq | EXTENDED_RELEASE_TABLET | Freq: Once | ORAL | Status: AC
  Administered 2020-11-06: 40 meq via ORAL
  Filled 2020-11-06: qty 2

## 2020-11-06 MED ORDER — APIXABAN 5 MG PO TABS: 5.0000 mg | ORAL_TABLET | Freq: Two times a day (BID) | ORAL | 0 refills | Status: DC

## 2020-11-06 MED ORDER — DILTIAZEM HCL ER COATED BEADS 120 MG PO CP24
120.0000 mg | ORAL_CAPSULE | Freq: Every day | ORAL | Status: DC
  Administered 2020-11-06: 120 mg via ORAL
  Filled 2020-11-06: qty 1

## 2020-11-06 MED FILL — CARTIA XT 120 MG CP24: 120 | 30 days supply | Qty: 30 | Fill #0

## 2020-11-06 MED FILL — ELIQUIS 5 MG TABLET: 5 | 30 days supply | Qty: 60 | Fill #0

## 2020-11-06 MED FILL — levoFLOXacin 750 MG TABS: 750 | 5 days supply | Qty: 5 | Fill #0

## 2020-11-06 NOTE — Progress Notes (Signed)
Progress Note  Patient Name: [name] Date of Encounter: 11/06/2020  [facility] Cardiologist: Sanda Klein, MD   Subjective   Mild DOE; no CP  Inpatient Medications    Scheduled Meds: . apixaban  5 mg Oral BID  . diltiazem  60 mg Oral Q6H  . insulin aspart  0-5 Units Subcutaneous QHS  . insulin aspart  0-9 Units Subcutaneous TID WC  . levothyroxine  75 mcg Oral Daily  . mirtazapine  15 mg Oral QHS  . potassium chloride  40 mEq Oral Once  . rosuvastatin  20 mg Oral QHS  . sodium chloride flush  3 mL Intravenous Q12H  . umeclidinium-vilanterol  1 puff Inhalation Daily   Continuous Infusions: . cefTRIAXone (ROCEPHIN)  IV 1 g (11/06/20 0449)   PRN Meds: acetaminophen **OR** acetaminophen, HYDROcodone-acetaminophen, levalbuterol, morphine injection, ondansetron **OR** ondansetron (ZOFRAN) IV   Vital Signs    Vitals:   11/05/20 1539 11/05/20 1928 11/06/20 0300 11/06/20 0736  BP: 131/74 (!) 151/91 128/77   Pulse: 89 97    Resp: 19 19 20    Temp: 98.2 F (36.8 C) 98.2 F (36.8 C) 98.6 F (37 C) 98.8 F (37.1 C)  TempSrc: Oral Oral Oral Oral  SpO2: 99% 98% 98%   Weight:   49.9 kg   Height:        Intake/Output Summary (Last 24 hours) at 11/06/2020 0839 Last data filed at 11/06/2020 0753 Gross per 24 hour  Intake 625.62 ml  Output 1010 ml  Net -384.38 ml   Last 3 Weights 11/06/2020 11/05/2020 11/04/2020  Weight (lbs) 110 lb 0.2 oz 109 lb 12.6 oz 108 lb 0.4 oz  Weight (kg) 49.9 kg 49.8 kg 49 kg      Telemetry    Sinus- Personally Reviewed   Physical Exam   GEN: No acute distress.   Neck: No JVD Cardiac: RRR Respiratory: Diminished BS throughout GI: Soft, nontender, non-distended  MS: No edema Neuro:  Nonfocal  Psych: Normal affect   Labs     Chemistry Recent Labs  Lab 11/02/20 1524 11/04/20 0513 11/05/20 0828 11/06/20 0404  NA 139 138 140 143  K 4.0 4.0 3.7 3.2*  CL 98 99 102 102  CO2 27 28 28 30   GLUCOSE 169* 122* 135* 146*  BUN 11  11 7* <5*  CREATININE 0.88 0.68 0.64 0.59  CALCIUM 9.3 8.7* 8.2* 8.7*  PROT 7.3 6.3*  --  5.8*  ALBUMIN 3.2* 2.5*  --  2.3*  AST 110* 66*  --  98*  ALT 116* 82*  --  117*  ALKPHOS 93 92  --  112  BILITOT 0.6 0.7  --  0.2*  GFRNONAA >60 >60 >60 >60  ANIONGAP 14 11 10 11      Hematology Recent Labs  Lab 11/04/20 0513 11/05/20 0828 11/06/20 0404  WBC 12.3* 7.8 7.9  RBC 3.50* 3.58* 3.15*  HGB 10.5* 10.3* 9.4*  HCT 31.8* 32.6* 27.8*  MCV 90.9 91.1 88.3  MCH 30.0 28.8 29.8  MCHC 33.0 31.6 33.8  RDW 13.2 13.0 13.0  PLT 240 263 260    Radiology    DG CHEST PORT 1 VIEW  Result Date: 11/04/2020 CLINICAL DATA:  70 year old female with shortness of breath. EXAM: PORTABLE CHEST 1 VIEW COMPARISON:  Chest radiograph dated 11/02/2020 FINDINGS: Emphysema. No focal consolidation, pleural effusion or pneumothorax. The cardiac silhouette is within limits. No acute osseous pathology. Atherosclerotic calcification of the aorta. IMPRESSION: No active cardiopulmonary disease. Electronically Signed  By: Anner Crete M.D.   On: 11/04/2020 22:04   ECHOCARDIOGRAM COMPLETE  Result Date: 11/05/2020    ECHOCARDIOGRAM REPORT   Patient Name:   [name] Date of Exam: 11/05/2020 Medical Rec #:  [id number]       Height:       61.0 in Accession #:    [id number]      Weight:       109.8 lb Date of Birth:  September 05, 1951        BSA:          1.464 m Patient Age:    70 years        BP:           113/65 mmHg Patient Gender: F               HR:           92 bpm. Exam Location:  Inpatient Procedure: 2D Echo                                MODIFIED REPORT:      This report was modified by Dorris Carnes MD on 11/05/2020 due to Complete                                   impression.  Indications:     atrial flutter  History:         Patient has prior history of Echocardiogram examinations, most                  recent 01/19/2016. COPD and sepsis; Risk Factors:Hypertension.  Sonographer:     Johny Chess Referring Phys:   [id number] [employer] Diagnosing Phys: Dorris Carnes MD IMPRESSIONS  1. Comapred to echo report from 2017, LVEF is unchanged but RV dysfunction is new.  2. Poor acoustic windows limit study.  3. Left ventricular ejection fraction, by estimation, is 50 to 55%. The left ventricle has low normal function. The left ventricle has no regional wall motion abnormalities. Left ventricular diastolic parameters are consistent with Grade I diastolic dysfunction (impaired relaxation).  4. Right ventricular systolic function is moderately reduced. The right ventricular size is mildly enlarged. There is mildly elevated pulmonary artery systolic pressure.  5. The mitral valve is abnormal. Mild mitral valve regurgitation.  6. The aortic valve is abnormal. Aortic valve regurgitation is not visualized. Mild to moderate aortic valve sclerosis/calcification is present, without any evidence of aortic stenosis. FINDINGS  Left Ventricle: Left ventricular ejection fraction, by estimation, is 50 to 55%. The left ventricle has low normal function. The left ventricle has no regional wall motion abnormalities. The left ventricular internal cavity size was normal in size. There is no left ventricular hypertrophy. Left ventricular diastolic parameters are consistent with Grade I diastolic dysfunction (impaired relaxation). Right Ventricle: The right ventricular size is mildly enlarged. Right vetricular wall thickness was not assessed. Right ventricular systolic function is moderately reduced. There is mildly elevated pulmonary artery systolic pressure. The tricuspid regurgitant velocity is 2.89 m/s, and with an assumed right atrial pressure of 3 mmHg, the estimated right ventricular systolic pressure is [id number] mmHg. Left Atrium: Left atrial size was normal in size. Right Atrium: Right atrial size was normal in size. Pericardium: There is no evidence of pericardial effusion. Mitral Valve: The mitral valve is abnormal. There is mild thickening of  the mitral valve leaflet(s). Mild mitral valve regurgitation. Tricuspid Valve: The tricuspid valve is normal in structure. Tricuspid valve regurgitation is mild. Aortic Valve: The aortic valve is abnormal. Aortic valve regurgitation is not visualized. Mild to moderate aortic valve sclerosis/calcification is present, without any evidence of aortic stenosis. Pulmonic Valve: The pulmonic valve was not well visualized. Pulmonic valve regurgitation is not visualized. Aorta: The aortic root is normal in size and structure. IAS/Shunts: The interatrial septum was not assessed.  LEFT VENTRICLE PLAX 2D LVIDd:         3.60 cm Diastology LVIDs:         2.90 cm LV e' medial:    7.18 cm/s LV PW:         0.80 cm LV E/e' medial:  13.8 LV IVS:        0.70 cm LV e' lateral:   9.03 cm/s                        LV E/e' lateral: 10.9  RIGHT VENTRICLE             IVC RV S prime:     10.80 cm/s  IVC diam: 1.90 cm TAPSE (M-mode): 1.9 cm LEFT ATRIUM             Index       RIGHT ATRIUM          Index LA diam:        3.20 cm 2.19 cm/m  RA Area:     9.21 cm LA Vol (A2C):   21.9 ml 14.96 ml/m RA Volume:   18.70 ml 12.78 ml/m LA Vol (A4C):   24.8 ml 16.94 ml/m LA Biplane Vol: 23.4 ml 15.99 ml/m  AORTIC VALVE LVOT Vmax:   93.80 cm/s LVOT Vmean:  60.700 cm/s LVOT VTI:    0.172 m MITRAL VALVE                TRICUSPID VALVE MV Area (PHT): 3.91 cm     TR Peak grad:   33.4 mmHg MV Decel Time: 194 msec     TR Vmax:        289.00 cm/s MV E velocity: 98.80 cm/s MV A velocity: 112.00 cm/s  SHUNTS MV E/A ratio:  0.88         Systemic VTI: 0.17 m Dorris Carnes MD Electronically signed by Dorris Carnes MD Signature Date/Time: 11/05/2020/8:12:01 PM    Final (Updated)     Patient Profile     70 year old female with past medical history of hypertension, hyperlipidemia, COPD, diabetes mellitus admitted with pyelonephritis for evaluation of atrial flutter. She developed back pain and admitted/diagnosed with pyelonephritis.  Also noted to be in atrial flutter  on admission and cardiology asked to evaluate.  Echocardiogram shows normal LV function, grade 1 diastolic dysfunction, moderate right ventricular dysfunction, mild right ventricular enlargement, mild mitral regurgitation.  Assessment & Plan    1 paroxysmal atrial flutter-remains in sinus rhythm this morning.  We will change Cardizem to CD 120 mg daily.  Echocardiogram shows normal LV function and TSH is normal.  We will continue apixaban 5 mg twice daily.  Referral to electrophysiology for consideration of atrial flutter ablation could be considered as an outpatient.  However given severity of COPD she will likely be at risk for other atrial arrhythmias including atrial fibrillation.  Therefore best option is likely rate control and anticoagulation long-term.    2 pyelonephritis-antibiotics per primary care.  3  hypertension-blood pressure is borderline.  Discontinue Cardizem 60 mg every 6 and treat with Cardizem CD 120 mg daily.  4 hyperlipidemia-liver functions remain elevated.  Will discontinue Crestor for now.  LFTs should be rechecked as an outpatient and potentially could resume low-dose statin if improved.  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  Would continue present medications.  We will arrange follow-up with APP in 2 to 4 weeks and Dr. Sallyanne Kuster in 3 months.  Please call with questions.  For questions or updates, please contact [place] Please consult [email or web address] for contact info under        Signed, Kirk Ruths, MD  11/06/2020, 8:39 AM

## 2020-11-06 NOTE — Discharge Summary (Signed)
Physician Discharge Summary  [name] ZOX:[id number] DOB: 1951/07/27 DOA: 11/02/2020  PCP: Seward Carol, MD  Admit date: 11/02/2020 Discharge date: 11/06/2020 30 Day Unplanned Readmission Risk Score   Flowsheet Row ED to Hosp-Admission (Current) from 11/02/2020 in [facility]  30 Day Unplanned Readmission Risk Score (%) 14.51 Filed at 11/06/2020 0801     This score is the patient's risk of an unplanned readmission within 30 days of being discharged (0 -100%). The score is based on dignosis, age, lab data, medications, orders, and past utilization.   Low:  0-14.9   Medium: 15-21.9   High: 22-29.9   Extreme: 30 and above         Admitted From: Home Disposition: Home  Recommendations for Outpatient Follow-up:  1. Follow up with PCP in 1-2 weeks 2. Follow-up with cardiology in 2 to 4 weeks. 3. Please obtain BMP/CBC in one week 4. Please follow up with your PCP on the following pending results: Unresulted Labs (From admission, onward)         None        Home Health: Yes Equipment/Devices: Wheelchair with cushion  Discharge Condition: Stable CODE STATUS: Full code Diet recommendation: Cardiac  Subjective: Seen and examined.  No complaints.  Ready to go home.  Brief/Interim Summary: [name] a 70 y.o.femalewith medical history significant ofHTN, HLD, COPD, chronic respiratory failure on 2 L of oxygen, and hypothyroidismpresented with complaints of worsening abdominal pain of the last 4 days. Upon admission to the emergency department patient was seen to be febrile up to 101.3 F, pulse elevated at 136, and O2 saturation maintained on home 2 L of oxygen. Labs significant for WBC 14.2, hemoglobin 11.6, glucose 169, lipase 23, AST 110, andALT 116.COVID-19 screening was negative. Urinalysis positive for large leukocytes, moderate hemoglobinCT scan of the abdomen pelvis significant for left pyelonephritis with cystitis. Patient was started on  empiric antibiotics of Rocephin.   She was admitted to hospital service with a diagnosis of severe sepsis secondary to pyelonephritis, her lactic acid was also 2.4.  She was started on IV fluids and antibiotics/Rocephin were continued. Late night of 11/04/2020, patient was diagnosed with new onset atrial flutter and is started on Cardizem drip.  Cardiology was formally consulted.  Her rates were controlled with IV Cardizem, this was subsequently switched to oral Cardizem and she was started on Eliquis.  She has been placed on long-acting oral Cardizem by cardiology now along with Eliquis 5 mg p.o. twice daily and they have recommended stopping aspirin.  They have cleared the patient for discharge and they will follow-up in 2 to 4 weeks.  Patient's blood culture remain negative.  Her initial urine culture grew multiple organisms.  Repeat urine culture was drawn yesterday which is still negative so far.  Patient has improved significantly with no more complaints.  No more leukocytosis and has remained afebrile for last 2 days.  Patient initially also had slightly elevated LFTs which initially improved but then went up a little bit once again.  She has no abdominal pain, nausea or vomiting.  I recommend her CMP should be repeated at PCPs visit next week.  73.2, this has been replaced as well.  Her COPD remained stable and she is on 2 L of oxygen which is her baseline.  I personally discussed risk and benefits of anticoagulation and patient verbalized understanding and she agreed with continuing that.  Patient is being discharged in stable condition.  Due to the fact that  we do not have any culture data available, I am discharging this patient on Levaquin 750 mg for 5 more days for pyelonephritis treatment.  Discharge Diagnoses:  Principal Problem:   Sepsis ([place]) Active Problems:   Essential hypertension   COPD GOLD III criteria but 02 dep   Hypothyroidism   Chronic respiratory failure with hypoxia (HCC)   Acute  pyelonephritis   AKI (acute kidney injury) (HCC)   Normocytic anemia   PAF (paroxysmal atrial fibrillation) (HCC)    Discharge Instructions   Allergies as of 11/06/2020   No Known Allergies     Medication List    STOP taking these medications   aspirin EC 81 MG tablet     TAKE these medications   albuterol (2.5 MG/3ML) 0.083% nebulizer solution Commonly known as: PROVENTIL Take 3 mLs (2.5 mg total) by nebulization every 2 (two) hours as needed for wheezing or shortness of breath. What changed: Another medication with the same name was changed. Make sure you understand how and when to take each.   albuterol 108 (90 Base) MCG/ACT inhaler Commonly known as: VENTOLIN HFA INHALE 2 PUFFS INTO THE LUNGS EVERY 4 (FOUR) HOURS AS NEEDED FOR WHEEZING OR SHORTNESS OF BREATH. What changed: See the new instructions.   alendronate 70 MG tablet Commonly known as: FOSAMAX Take 70 mg by mouth every Monday.   Anoro Ellipta 62.5-25 MCG/INH Aepb Generic drug: umeclidinium-vilanterol ONLY open THE device one time, one click THEN take your two separate drags TO be sure you get it all What changed: See the new instructions.   apixaban 5 MG Tabs tablet Commonly known as: ELIQUIS Take 1 tablet (5 mg total) by mouth 2 (two) times daily.   atenolol 25 MG tablet Commonly known as: TENORMIN Take 25 mg by mouth daily.   diltiazem 120 MG 24 hr capsule Commonly known as: CARDIZEM CD Take 1 capsule (120 mg total) by mouth daily.   guaiFENesin 600 MG 12 hr tablet Commonly known as: MUCINEX Take 1 tablet (600 mg total) by mouth 2 (two) times daily.   levofloxacin 750 MG tablet Commonly known as: Levaquin Take 1 tablet (750 mg total) by mouth daily for 5 days.   levothyroxine 75 MCG tablet Commonly known as: SYNTHROID Take 75 mcg by mouth daily.   Magnesium 400 MG Caps Take 400 mg by mouth daily.   mirtazapine 15 MG tablet Commonly known as: REMERON Take 15 mg by mouth at bedtime.    multivitamin with minerals Tabs tablet Take 1 tablet by mouth daily.   OXYGEN Pt uses 2lpm with sleep and 4 lpm with exertion  DME- [facility]   rosuvastatin 20 MG tablet Commonly known as: CRESTOR Take 20 mg by mouth at bedtime.   VITAMIN B-12 PO Take 500 mcg by mouth daily.   vitamin C 500 MG tablet Commonly known as: ASCORBIC ACID Take 500 mg by mouth daily.   vitamin E 180 MG (400 UNITS) capsule Take 400 Units by mouth daily.            Durable Medical Equipment  (From admission, onward)         Start     Ordered   11/06/20 0911  For home use only DME wheelchair cushion (seat and back)  Once        11/06/20 0911   11/06/20 0911  For home use only DME standard manual wheelchair with seat cushion  Once       Comments: Patient suffers from copd/pain which  impairs their ability to perform daily activities like bathing in the home.  A walker will not resolve issue with performing activities of daily living. A wheelchair will allow patient to safely perform daily activities. Patient can safely propel the wheelchair in the home or has a caregiver who can provide assistance. Length of need Lifetime. Accessories: elevating leg rests (ELRs), wheel locks, extensions and anti-tippers.   11/06/20 0911          Follow-up Information    Seward Carol, MD Follow up in 1 week(s).   Specialty: Internal Medicine Contact information: [address] [place] [address] [phone]        Sanda Klein, MD .   Specialty: Cardiology Contact information: [address] [place] [address] [phone]              No Known Allergies  Consultations: Cardiology   Procedures/Studies: DG Chest 2 View  Result Date: 11/02/2020 CLINICAL DATA:  Worsening shortness of breath for 4 days. Hypoxia. COPD. EXAM: CHEST - 2 VIEW COMPARISON:  10/04/2018 FINDINGS: The heart size and mediastinal contours are within normal limits. Aortic  atherosclerotic calcification noted. Marked pulmonary hyperinflation is again seen, consistent with COPD. Both lungs are clear. The visualized skeletal structures are unremarkable. IMPRESSION: COPD. No active cardiopulmonary disease. Electronically Signed   By: Marlaine Hind M.D.   On: 11/02/2020 18:18   CT Abdomen Pelvis W Contrast  Result Date: 11/03/2020 CLINICAL DATA:  Abdominal pain acute, nonlocalized. EXAM: CT ABDOMEN AND PELVIS WITH CONTRAST TECHNIQUE: Multidetector CT imaging of the abdomen and pelvis was performed using the standard protocol following bolus administration of intravenous contrast. CONTRAST:  147mL OMNIPAQUE IOHEXOL 300 MG/ML  SOLN COMPARISON:  None. FINDINGS: Lower chest: Suggestion of emphysematous changes. Hepatobiliary: No focal liver abnormality. No gallstones, gallbladder wall thickening, or pericholecystic fluid. No biliary dilatation. Pancreas: No focal lesion. Normal pancreatic contour. No surrounding inflammatory changes. No main pancreatic ductal dilatation. Spleen: Normal in size without focal abnormality. Adrenals/Urinary Tract: No adrenal nodule bilaterally. Left striated nephrogram. The right kidney enhances homogeneous Lea. No abscess formation identified within the kidneys. Subcentimeter hypodensities are too small to characterize. No hydronephrosis. No hydroureter. No nephroureterolithiasis. Thickened urinary bladder wall and hyperenhancing mucosa. On delayed imaging, there is no urothelial wall thickening and there are no filling defects in the opacified portions of the bilateral collecting systems or ureters. Stomach/Bowel: Stomach is within normal limits. No evidence of bowel wall thickening or dilatation. No pneumatosis. Scattered colonic diverticulosis. Appendix appears normal. Vascular/Lymphatic: No abdominal aorta or iliac aneurysm. Severe calcified and noncalcified atherosclerotic plaque of the aorta and its branches. No abdominal, pelvic, or inguinal  lymphadenopathy. Reproductive: Uterus and bilateral adnexa are unremarkable. Other: No intraperitoneal free fluid. No intraperitoneal free gas. No organized fluid collection. Musculoskeletal: No abdominal wall hernia or abnormality No suspicious lytic or blastic osseous lesions. No acute displaced fracture. Intervertebral disc space narrowing and vacuum phenomenon as well as endplate sclerosis at the L4-L5 level. IMPRESSION: 1. Left pyelonephritis and cystitis with no abscess formation. 2.  Aortic Atherosclerosis (ICD10-I70.0). Electronically Signed   By: Iven Finn M.D.   On: 11/03/2020 04:56   DG CHEST PORT 1 VIEW  Result Date: 11/04/2020 CLINICAL DATA:  70 year old female with shortness of breath. EXAM: PORTABLE CHEST 1 VIEW COMPARISON:  Chest radiograph dated 11/02/2020 FINDINGS: Emphysema. No focal consolidation, pleural effusion or pneumothorax. The cardiac silhouette is within limits. No acute osseous pathology. Atherosclerotic calcification of the aorta. IMPRESSION: No  active cardiopulmonary disease. Electronically Signed   By: Anner Crete M.D.   On: 11/04/2020 22:04   ECHOCARDIOGRAM COMPLETE  Result Date: 11/05/2020    ECHOCARDIOGRAM REPORT   Patient Name:   [name] Date of Exam: 11/05/2020 Medical Rec #:  [id number]       Height:       61.0 in Accession #:    [id number]      Weight:       109.8 lb Date of Birth:  Apr 26, 1951        BSA:          1.464 m Patient Age:    70 years        BP:           113/65 mmHg Patient Gender: F               HR:           92 bpm. Exam Location:  Inpatient Procedure: 2D Echo                                MODIFIED REPORT:      This report was modified by Dorris Carnes MD on 11/05/2020 due to Complete                                   impression.  Indications:     atrial flutter  History:         Patient has prior history of Echocardiogram examinations, most                  recent 01/19/2016. COPD and sepsis; Risk Factors:Hypertension.  Sonographer:     Johny Chess Referring Phys:  [id number] [place] Diagnosing Phys: Dorris Carnes MD IMPRESSIONS  1. Comapred to echo report from 2017, LVEF is unchanged but RV dysfunction is new.  2. Poor acoustic windows limit study.  3. Left ventricular ejection fraction, by estimation, is 50 to 55%. The left ventricle has low normal function. The left ventricle has no regional wall motion abnormalities. Left ventricular diastolic parameters are consistent with Grade I diastolic dysfunction (impaired relaxation).  4. Right ventricular systolic function is moderately reduced. The right ventricular size is mildly enlarged. There is mildly elevated pulmonary artery systolic pressure.  5. The mitral valve is abnormal. Mild mitral valve regurgitation.  6. The aortic valve is abnormal. Aortic valve regurgitation is not visualized. Mild to moderate aortic valve sclerosis/calcification is present, without any evidence of aortic stenosis. FINDINGS  Left Ventricle: Left ventricular ejection fraction, by estimation, is 50 to 55%. The left ventricle has low normal function. The left ventricle has no regional wall motion abnormalities. The left ventricular internal cavity size was normal in size. There is no left ventricular hypertrophy. Left ventricular diastolic parameters are consistent with Grade I diastolic dysfunction (impaired relaxation). Right Ventricle: The right ventricular size is mildly enlarged. Right vetricular wall thickness was not assessed. Right ventricular systolic function is moderately reduced. There is mildly elevated pulmonary artery systolic pressure. The tricuspid regurgitant velocity is 2.89 m/s, and with an assumed right atrial pressure of 3 mmHg, the estimated right ventricular systolic pressure is [id number] mmHg. Left Atrium: Left atrial size was normal in size. Right Atrium: Right atrial size was normal in size. Pericardium: There is no evidence of pericardial effusion. Mitral Valve: The mitral valve  is abnormal.  There is mild thickening of the mitral valve leaflet(s). Mild mitral valve regurgitation. Tricuspid Valve: The tricuspid valve is normal in structure. Tricuspid valve regurgitation is mild. Aortic Valve: The aortic valve is abnormal. Aortic valve regurgitation is not visualized. Mild to moderate aortic valve sclerosis/calcification is present, without any evidence of aortic stenosis. Pulmonic Valve: The pulmonic valve was not well visualized. Pulmonic valve regurgitation is not visualized. Aorta: The aortic root is normal in size and structure. IAS/Shunts: The interatrial septum was not assessed.  LEFT VENTRICLE PLAX 2D LVIDd:         3.60 cm Diastology LVIDs:         2.90 cm LV e' medial:    7.18 cm/s LV PW:         0.80 cm LV E/e' medial:  13.8 LV IVS:        0.70 cm LV e' lateral:   9.03 cm/s                        LV E/e' lateral: 10.9  RIGHT VENTRICLE             IVC RV S prime:     10.80 cm/s  IVC diam: 1.90 cm TAPSE (M-mode): 1.9 cm LEFT ATRIUM             Index       RIGHT ATRIUM          Index LA diam:        3.20 cm 2.19 cm/m  RA Area:     9.21 cm LA Vol (A2C):   21.9 ml 14.96 ml/m RA Volume:   18.70 ml 12.78 ml/m LA Vol (A4C):   24.8 ml 16.94 ml/m LA Biplane Vol: 23.4 ml 15.99 ml/m  AORTIC VALVE LVOT Vmax:   93.80 cm/s LVOT Vmean:  60.700 cm/s LVOT VTI:    0.172 m MITRAL VALVE                TRICUSPID VALVE MV Area (PHT): 3.91 cm     TR Peak grad:   33.4 mmHg MV Decel Time: 194 msec     TR Vmax:        289.00 cm/s MV E velocity: 98.80 cm/s MV A velocity: 112.00 cm/s  SHUNTS MV E/A ratio:  0.88         Systemic VTI: 0.17 m Dorris Carnes MD Electronically signed by Dorris Carnes MD Signature Date/Time: 11/05/2020/8:12:01 PM    Final (Updated)      Discharge Exam: Vitals:   11/06/20 0736 11/06/20 0900  BP:  119/80  Pulse:    Resp:  18  Temp: 98.8 F (37.1 C)   SpO2:     Vitals:   11/05/20 1928 11/06/20 0300 11/06/20 0736 11/06/20 0900  BP: (!) 151/91 128/77  119/80  Pulse: 97     Resp: 19 20   18   Temp: 98.2 F (36.8 C) 98.6 F (37 C) 98.8 F (37.1 C)   TempSrc: Oral Oral Oral   SpO2: 98% 98%    Weight:  49.9 kg    Height:        General: Pt is alert, awake, not in acute distress Cardiovascular: RRR, S1/S2 +, no rubs, no gallops Respiratory: CTA bilaterally, no wheezing, no rhonchi Abdominal: Soft, NT, ND, bowel sounds + Extremities: no edema, no cyanosis    The results of significant diagnostics from this hospitalization (including imaging, microbiology, ancillary and laboratory) are listed  below for reference.     Microbiology: Recent Results (from the past 240 hour(s))  Urine culture     Status: Abnormal   Collection Time: 11/02/20  3:20 PM   Specimen: Urine, Random  Result Value Ref Range Status   Specimen Description URINE, RANDOM  Final   Special Requests   Final    NONE Performed at [facility], [address], [place], [place] [address]    Culture MULTIPLE SPECIES PRESENT, SUGGEST RECOLLECTION (A)  Final   Report Status 11/04/2020 FINAL  Final  Blood Culture (routine x 2)     Status: None (Preliminary result)   Collection Time: 11/03/20  8:19 AM   Specimen: BLOOD  Result Value Ref Range Status   Specimen Description BLOOD LEFT ANTECUBITAL  Final   Special Requests   Final    BOTTLES DRAWN AEROBIC AND ANAEROBIC Blood Culture adequate volume   Culture   Final    NO GROWTH 2 DAYS Performed at [facility], [place] [address] [place], [place] [address]    Report Status PENDING  Incomplete  Blood Culture (routine x 2)     Status: None (Preliminary result)   Collection Time: 11/03/20  8:22 AM   Specimen: BLOOD  Result Value Ref Range Status   Specimen Description BLOOD RIGHT ANTECUBITAL  Final   Special Requests   Final    BOTTLES DRAWN AEROBIC AND ANAEROBIC Blood Culture adequate volume   Culture   Final    NO GROWTH 2 DAYS Performed at [facility], [place] [address] [place], [place] [address]    Report Status PENDING  Incomplete   Culture, Urine     Status: Abnormal   Collection Time: 11/05/20  1:10 PM   Specimen: Urine, Random  Result Value Ref Range Status   Specimen Description URINE, RANDOM  Final   Special Requests NONE  Final   Culture (A)  Final    <10,000 COLONIES/mL INSIGNIFICANT GROWTH Performed at [facility], [place] [address] [place], [place] [address]    Report Status 11/06/2020 FINAL  Final     Labs: BNP (last 3 results) No results for input(s): BNP in the last 8760 hours. Basic Metabolic Panel: Recent Labs  Lab 11/02/20 1524 11/04/20 0513 11/05/20 0828 11/06/20 0404  NA 139 138 140 143  K 4.0 4.0 3.7 3.2*  CL 98 99 102 102  CO2 27 28 28 30   GLUCOSE 169* 122* 135* 146*  BUN 11 11 7* <5*  CREATININE 0.88 0.68 0.64 0.59  CALCIUM 9.3 8.7* 8.2* 8.7*   Liver Function Tests: Recent Labs  Lab 11/02/20 1524 11/04/20 0513 11/06/20 0404  AST 110* 66* 98*  ALT 116* 82* 117*  ALKPHOS 93 92 112  BILITOT 0.6 0.7 0.2*  PROT 7.3 6.3* 5.8*  ALBUMIN 3.2* 2.5* 2.3*   Recent Labs  Lab 11/02/20 1524  LIPASE 23   No results for input(s): AMMONIA in the last 168 hours. CBC: Recent Labs  Lab 11/02/20 1524 11/04/20 0513 11/05/20 0828 11/06/20 0404  WBC 14.2* 12.3* 7.8 7.9  NEUTROABS  --   --  5.2 4.8  HGB 11.6* 10.5* 10.3* 9.4*  HCT 34.7* 31.8* 32.6* 27.8*  MCV 88.5 90.9 91.1 88.3  PLT 243 240 263 260   Cardiac Enzymes: No results for input(s): CKTOTAL, CKMB, CKMBINDEX, TROPONINI in the last 168 hours. BNP: Invalid input(s): POCBNP CBG: Recent Labs  Lab 11/05/20 0755 11/05/20 1213 11/05/20 1540 11/05/20 2107 11/06/20  0603  GLUCAP 119* 138* 175* 148* 133*   D-Dimer No results for input(s): DDIMER in the last 72 hours. Hgb A1c Recent Labs    11/04/20 0513  HGBA1C 6.6*   Lipid Profile No results for input(s): CHOL, HDL, LDLCALC, TRIG, CHOLHDL, LDLDIRECT in the last 72 hours. Thyroid function studies Recent Labs    11/04/20 2225  TSH 1.911   Anemia work  up No results for input(s): VITAMINB12, FOLATE, FERRITIN, TIBC, IRON, RETICCTPCT in the last 72 hours. Urinalysis    Component Value Date/Time   COLORURINE YELLOW 11/02/2020 1520   APPEARANCEUR CLOUDY (A) 11/02/2020 1520   LABSPEC 1.013 11/02/2020 1520   PHURINE 5.0 11/02/2020 1520   GLUCOSEU NEGATIVE 11/02/2020 1520   HGBUR MODERATE (A) 11/02/2020 1520   BILIRUBINUR NEGATIVE 11/02/2020 1520   KETONESUR 80 (A) 11/02/2020 1520   PROTEINUR 100 (A) 11/02/2020 1520   UROBILINOGEN 0.2 05/29/2015 1711   NITRITE NEGATIVE 11/02/2020 1520   LEUKOCYTESUR LARGE (A) 11/02/2020 1520   Sepsis Labs Invalid input(s): PROCALCITONIN,  WBC,  LACTICIDVEN Microbiology Recent Results (from the past 240 hour(s))  Urine culture     Status: Abnormal   Collection Time: 11/02/20  3:20 PM   Specimen: Urine, Random  Result Value Ref Range Status   Specimen Description URINE, RANDOM  Final   Special Requests   Final    NONE Performed at [facility], [place] [address] [place], [place] [address]    Culture MULTIPLE SPECIES PRESENT, SUGGEST RECOLLECTION (A)  Final   Report Status 11/04/2020 FINAL  Final  Blood Culture (routine x 2)     Status: None (Preliminary result)   Collection Time: 11/03/20  8:19 AM   Specimen: BLOOD  Result Value Ref Range Status   Specimen Description BLOOD LEFT ANTECUBITAL  Final   Special Requests   Final    BOTTLES DRAWN AEROBIC AND ANAEROBIC Blood Culture adequate volume   Culture   Final    NO GROWTH 2 DAYS Performed at [facility], [place] [address] [place], [place] [address]    Report Status PENDING  Incomplete  Blood Culture (routine x 2)     Status: None (Preliminary result)   Collection Time: 11/03/20  8:22 AM   Specimen: BLOOD  Result Value Ref Range Status   Specimen Description BLOOD RIGHT ANTECUBITAL  Final   Special Requests   Final    BOTTLES DRAWN AEROBIC AND ANAEROBIC Blood Culture adequate volume   Culture   Final    NO GROWTH 2  DAYS Performed at [facility], [place] [address], [place], [place] [address]    Report Status PENDING  Incomplete  Culture, Urine     Status: Abnormal   Collection Time: 11/05/20  1:10 PM   Specimen: Urine, Random  Result Value Ref Range Status   Specimen Description URINE, RANDOM  Final   Special Requests NONE  Final   Culture (A)  Final    <10,000 COLONIES/mL INSIGNIFICANT GROWTH Performed at [facility], [place] [address], [place], [place] [address]    Report Status 11/06/2020 FINAL  Final     Time coordinating discharge: Over 30 minutes  SIGNED:   Darliss Cheney, MD  [facility] 11/06/2020, 9:41 AM  If 7PM-7AM, please contact night-coverage [email or web address]

## 2020-11-06 NOTE — TOC Transition Note (Addendum)
Transition of Care [facility]) - CM/SW Discharge Note   Patient Details  Name: Stevey Stapleton MRN: [id number] Date of Birth: [birth year]  Transition of Care [facility] [place]) CM/SW Contact:  Zenon Mayo, RN Phone Number: 11/06/2020, 11:29 AM   Clinical Narrative:    Patient is for dc today, NCM offered choice , she states she does not have a preference, NCM made referral to [place] with [facility].  She is able to take referral for HHPT.  Soc will begin 24 to 48 hrs post dc.  Patient will also need a BSC, NCM made referral to [facility] with [facility]. This will be brought up to patient room prior to dc.  Patient received a rollator recently so she is unable to get a w/chair thru insurance unless she wants to pay privately for it.  She states that's ok she does not want w/chair.    Final next level of care: [place] Barriers to Discharge: No Barriers Identified   Patient Goals and CMS Choice Patient states their goals for this hospitalization and ongoing recovery are:: get better CMS Medicare.gov Compare Post Acute Care list provided to:: Patient Choice offered to / list presented to : Patient  Discharge Placement                       Discharge Plan and Services                DME Arranged: Bedside commode DME Agency: [employer] Date DME Agency Contacted: 11/06/20 Time DME Agency Contacted: 0093 Representative spoke with at DME Agency: [place]: PT [place]: [facility] [employer]) Date [place]: 11/06/20 Time [place]: 1128 Representative spoke with at [place]: [place]  Social Determinants of Health ([place]) Interventions     Readmission Risk Interventions No flowsheet data found.

## 2020-11-08 LAB — CULTURE, BLOOD (ROUTINE X 2)
Culture: NO GROWTH
Culture: NO GROWTH
Special Requests: ADEQUATE
Special Requests: ADEQUATE

## 2020-11-10 DIAGNOSIS — J449 Chronic obstructive pulmonary disease, unspecified: Secondary | ICD-10-CM | POA: Diagnosis not present

## 2020-11-11 ENCOUNTER — Telehealth (HOSPITAL_COMMUNITY): Payer: Self-pay | Admitting: Pharmacist

## 2020-11-11 NOTE — Telephone Encounter (Signed)
Transitions of Care Pharmacy   Call attempted for a pharmacy transitions of care follow-up. HIPAA appropriate voicemail was left with call back information provided.   Call attempt #1. Will follow-up in 2-3 days.   Garnet Sierras, PharmD

## 2020-11-11 NOTE — Telephone Encounter (Signed)
Pharmacy Transitions of Care Follow-up Telephone Call  Date of discharge: 2/4 Discharge Diagnosis: PAF  How have you been since you were released from the hospital? Doing well  Medication changes made at discharge: eliquis, diltiazem, levaquin  Medication changes obtained and verified? Y    Medication Accessibility:  Home Pharmacy: [facility] [place]   Was the patient provided with refills on discharged medications? N   Have all prescriptions been transferred from [facility] to home pharmacy? n/a  Is the patient able to afford medications? Yes . Notable copays: $9.85 per 30 day supply    Medication Review:  APIXABEN (ELIQUIS)  -Apixaban 5mg  BID started on 2/4 - Discussed importance of taking medication around the same time everyday  - Reviewed potential DDIs with patient  - Advised patient of medications to avoid (NSAIDs, ASA)  - Educated that Tylenol (acetaminophen) will be the preferred analgesic to prevent risk of bleeding  - Emphasized importance of monitoring for signs and symptoms of bleeding (abnormal bruising, prolonged bleeding, nose bleeds, bleeding from gums, discolored urine, black tarry stools)  - Advised patient to alert all providers of anticoagulation therapy prior to starting a new medication or having a procedure    Follow-up Appointments:  PCP Hospital f/u appt confirmed?  Scheduled to see Dr. Delfina Redwood on 2/11 @10 :15am per pt  [facility] f/u appt confirmed?  Scheduled to see Fabian Sharp on 11/23/20  If their condition worsens, is the pt aware to call PCP or go to the Emergency Dept.? Y  Final Patient Assessment:  Pt doing well after discharge. Talked to pt and her daughter. Both are aware to get new prescription of Eliquis and per pt, she will ask Dr. Delfina Redwood on 2/11.   Garnet Sierras, PharmD

## 2020-11-13 DIAGNOSIS — A419 Sepsis, unspecified organism: Secondary | ICD-10-CM | POA: Diagnosis not present

## 2020-11-13 DIAGNOSIS — N12 Tubulo-interstitial nephritis, not specified as acute or chronic: Secondary | ICD-10-CM | POA: Diagnosis not present

## 2020-11-13 DIAGNOSIS — I480 Paroxysmal atrial fibrillation: Secondary | ICD-10-CM | POA: Diagnosis not present

## 2020-11-13 DIAGNOSIS — R945 Abnormal results of liver function studies: Secondary | ICD-10-CM | POA: Diagnosis not present

## 2020-11-13 DIAGNOSIS — D649 Anemia, unspecified: Secondary | ICD-10-CM | POA: Diagnosis not present

## 2020-11-15 DIAGNOSIS — Z7901 Long term (current) use of anticoagulants: Secondary | ICD-10-CM | POA: Diagnosis not present

## 2020-11-15 DIAGNOSIS — J449 Chronic obstructive pulmonary disease, unspecified: Secondary | ICD-10-CM | POA: Diagnosis not present

## 2020-11-15 DIAGNOSIS — N10 Acute tubulo-interstitial nephritis: Secondary | ICD-10-CM | POA: Diagnosis not present

## 2020-11-15 DIAGNOSIS — I10 Essential (primary) hypertension: Secondary | ICD-10-CM | POA: Diagnosis not present

## 2020-11-15 DIAGNOSIS — E785 Hyperlipidemia, unspecified: Secondary | ICD-10-CM | POA: Diagnosis not present

## 2020-11-15 DIAGNOSIS — I480 Paroxysmal atrial fibrillation: Secondary | ICD-10-CM | POA: Diagnosis not present

## 2020-11-15 DIAGNOSIS — E039 Hypothyroidism, unspecified: Secondary | ICD-10-CM | POA: Diagnosis not present

## 2020-11-15 DIAGNOSIS — J9611 Chronic respiratory failure with hypoxia: Secondary | ICD-10-CM | POA: Diagnosis not present

## 2020-11-15 DIAGNOSIS — N3090 Cystitis, unspecified without hematuria: Secondary | ICD-10-CM | POA: Diagnosis not present

## 2020-11-15 DIAGNOSIS — D649 Anemia, unspecified: Secondary | ICD-10-CM | POA: Diagnosis not present

## 2020-11-18 DIAGNOSIS — D649 Anemia, unspecified: Secondary | ICD-10-CM | POA: Diagnosis not present

## 2020-11-18 DIAGNOSIS — J9611 Chronic respiratory failure with hypoxia: Secondary | ICD-10-CM | POA: Diagnosis not present

## 2020-11-18 DIAGNOSIS — N3090 Cystitis, unspecified without hematuria: Secondary | ICD-10-CM | POA: Diagnosis not present

## 2020-11-18 DIAGNOSIS — N10 Acute tubulo-interstitial nephritis: Secondary | ICD-10-CM | POA: Diagnosis not present

## 2020-11-18 DIAGNOSIS — Z7901 Long term (current) use of anticoagulants: Secondary | ICD-10-CM | POA: Diagnosis not present

## 2020-11-18 DIAGNOSIS — I480 Paroxysmal atrial fibrillation: Secondary | ICD-10-CM | POA: Diagnosis not present

## 2020-11-18 DIAGNOSIS — E785 Hyperlipidemia, unspecified: Secondary | ICD-10-CM | POA: Diagnosis not present

## 2020-11-18 DIAGNOSIS — J449 Chronic obstructive pulmonary disease, unspecified: Secondary | ICD-10-CM | POA: Diagnosis not present

## 2020-11-18 DIAGNOSIS — E039 Hypothyroidism, unspecified: Secondary | ICD-10-CM | POA: Diagnosis not present

## 2020-11-18 DIAGNOSIS — I10 Essential (primary) hypertension: Secondary | ICD-10-CM | POA: Diagnosis not present
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
## 2020-11-23 ENCOUNTER — Ambulatory Visit (INDEPENDENT_AMBULATORY_CARE_PROVIDER_SITE_OTHER): Payer: Medicare HMO | Admitting: Physician Assistant

## 2020-11-23 ENCOUNTER — Encounter: Payer: Self-pay | Admitting: Physician Assistant

## 2020-11-23 ENCOUNTER — Other Ambulatory Visit: Payer: Self-pay

## 2020-11-23 VITALS — BP 138/76 | HR 76 | Ht 61.0 in | Wt 103.0 lb

## 2020-11-23 DIAGNOSIS — E785 Hyperlipidemia, unspecified: Secondary | ICD-10-CM | POA: Diagnosis not present

## 2020-11-23 DIAGNOSIS — M810 Age-related osteoporosis without current pathological fracture: Secondary | ICD-10-CM | POA: Diagnosis not present

## 2020-11-23 DIAGNOSIS — J441 Chronic obstructive pulmonary disease with (acute) exacerbation: Secondary | ICD-10-CM

## 2020-11-23 DIAGNOSIS — J449 Chronic obstructive pulmonary disease, unspecified: Secondary | ICD-10-CM | POA: Diagnosis not present

## 2020-11-23 DIAGNOSIS — E039 Hypothyroidism, unspecified: Secondary | ICD-10-CM | POA: Diagnosis not present

## 2020-11-23 DIAGNOSIS — I480 Paroxysmal atrial fibrillation: Secondary | ICD-10-CM

## 2020-11-23 DIAGNOSIS — G4700 Insomnia, unspecified: Secondary | ICD-10-CM | POA: Diagnosis not present

## 2020-11-23 DIAGNOSIS — R7989 Other specified abnormal findings of blood chemistry: Secondary | ICD-10-CM | POA: Diagnosis not present

## 2020-11-23 DIAGNOSIS — E7800 Pure hypercholesterolemia, unspecified: Secondary | ICD-10-CM | POA: Diagnosis not present

## 2020-11-23 DIAGNOSIS — Z7901 Long term (current) use of anticoagulants: Secondary | ICD-10-CM

## 2020-11-23 DIAGNOSIS — D649 Anemia, unspecified: Secondary | ICD-10-CM

## 2020-11-23 DIAGNOSIS — I10 Essential (primary) hypertension: Secondary | ICD-10-CM | POA: Diagnosis not present

## 2020-11-23 NOTE — Patient Instructions (Addendum)
Medication Instructions:  Stop Crestor.  *If you need a refill on your cardiac medications before your next appointment, please call your pharmacy*   Lab Work: No Changes If you have labs (blood work) drawn today and your tests are completely normal, you will receive your results only by: Marland Kitchen MyChart Message (if you have MyChart) OR . A paper copy in the mail If you have any lab test that is abnormal or we need to change your treatment, we will call you to review the results.   Testing/Procedures: No Testing   Follow-Up: At [facility], you and your health needs are our priority.  As part of our continuing mission to provide you with exceptional heart care, we have created designated Provider Care Teams.  These Care Teams include your primary Cardiologist (physician) and Advanced Practice Providers (APPs -  Physician Assistants and Nurse Practitioners) who all work together to provide you with the care you need, when you need it.  Your next appointment:   Feb 05, 2021  The format for your next appointment:   In Person  Provider:   Sanda Klein, MD  Please contact PCP for most recent Labs to be faxed to our office. Our Fax # [phone]

## 2020-11-24 DIAGNOSIS — H547 Unspecified visual loss: Secondary | ICD-10-CM | POA: Diagnosis not present

## 2020-11-24 DIAGNOSIS — G4700 Insomnia, unspecified: Secondary | ICD-10-CM | POA: Diagnosis not present

## 2020-11-24 DIAGNOSIS — E039 Hypothyroidism, unspecified: Secondary | ICD-10-CM | POA: Diagnosis not present

## 2020-11-24 DIAGNOSIS — I10 Essential (primary) hypertension: Secondary | ICD-10-CM | POA: Diagnosis not present

## 2020-11-24 DIAGNOSIS — D6869 Other thrombophilia: Secondary | ICD-10-CM | POA: Diagnosis not present

## 2020-11-24 DIAGNOSIS — J439 Emphysema, unspecified: Secondary | ICD-10-CM | POA: Diagnosis not present

## 2020-11-24 DIAGNOSIS — J9610 Chronic respiratory failure, unspecified whether with hypoxia or hypercapnia: Secondary | ICD-10-CM | POA: Diagnosis not present

## 2020-11-24 DIAGNOSIS — I4891 Unspecified atrial fibrillation: Secondary | ICD-10-CM | POA: Diagnosis not present

## 2020-11-24 DIAGNOSIS — K08109 Complete loss of teeth, unspecified cause, unspecified class: Secondary | ICD-10-CM | POA: Diagnosis not present

## 2020-11-24 DIAGNOSIS — G8929 Other chronic pain: Secondary | ICD-10-CM | POA: Diagnosis not present

## 2020-11-26 DIAGNOSIS — E785 Hyperlipidemia, unspecified: Secondary | ICD-10-CM | POA: Diagnosis not present

## 2020-11-26 DIAGNOSIS — N10 Acute tubulo-interstitial nephritis: Secondary | ICD-10-CM | POA: Diagnosis not present

## 2020-11-26 DIAGNOSIS — Z7901 Long term (current) use of anticoagulants: Secondary | ICD-10-CM | POA: Diagnosis not present

## 2020-11-26 DIAGNOSIS — J9611 Chronic respiratory failure with hypoxia: Secondary | ICD-10-CM | POA: Diagnosis not present

## 2020-11-26 DIAGNOSIS — N3090 Cystitis, unspecified without hematuria: Secondary | ICD-10-CM | POA: Diagnosis not present

## 2020-11-26 DIAGNOSIS — I10 Essential (primary) hypertension: Secondary | ICD-10-CM | POA: Diagnosis not present

## 2020-11-26 DIAGNOSIS — I480 Paroxysmal atrial fibrillation: Secondary | ICD-10-CM | POA: Diagnosis not present

## 2020-11-26 DIAGNOSIS — D649 Anemia, unspecified: Secondary | ICD-10-CM | POA: Diagnosis not present

## 2020-11-26 DIAGNOSIS — E039 Hypothyroidism, unspecified: Secondary | ICD-10-CM | POA: Diagnosis not present

## 2020-11-26 DIAGNOSIS — J449 Chronic obstructive pulmonary disease, unspecified: Secondary | ICD-10-CM | POA: Diagnosis not present

## 2020-11-27 DIAGNOSIS — N10 Acute tubulo-interstitial nephritis: Secondary | ICD-10-CM | POA: Diagnosis not present

## 2020-11-27 DIAGNOSIS — I480 Paroxysmal atrial fibrillation: Secondary | ICD-10-CM | POA: Diagnosis not present

## 2020-11-27 DIAGNOSIS — J9611 Chronic respiratory failure with hypoxia: Secondary | ICD-10-CM | POA: Diagnosis not present

## 2020-11-27 DIAGNOSIS — E039 Hypothyroidism, unspecified: Secondary | ICD-10-CM | POA: Diagnosis not present

## 2020-11-27 DIAGNOSIS — Z7901 Long term (current) use of anticoagulants: Secondary | ICD-10-CM | POA: Diagnosis not present

## 2020-11-27 DIAGNOSIS — E785 Hyperlipidemia, unspecified: Secondary | ICD-10-CM | POA: Diagnosis not present

## 2020-11-27 DIAGNOSIS — I10 Essential (primary) hypertension: Secondary | ICD-10-CM | POA: Diagnosis not present

## 2020-11-27 DIAGNOSIS — J449 Chronic obstructive pulmonary disease, unspecified: Secondary | ICD-10-CM | POA: Diagnosis not present

## 2020-11-27 DIAGNOSIS — D649 Anemia, unspecified: Secondary | ICD-10-CM | POA: Diagnosis not present

## 2020-11-27 DIAGNOSIS — N3090 Cystitis, unspecified without hematuria: Secondary | ICD-10-CM | POA: Diagnosis not present

## 2020-11-30 DIAGNOSIS — N10 Acute tubulo-interstitial nephritis: Secondary | ICD-10-CM | POA: Diagnosis not present

## 2020-11-30 DIAGNOSIS — N3090 Cystitis, unspecified without hematuria: Secondary | ICD-10-CM | POA: Diagnosis not present

## 2020-11-30 DIAGNOSIS — E785 Hyperlipidemia, unspecified: Secondary | ICD-10-CM | POA: Diagnosis not present

## 2020-11-30 DIAGNOSIS — Z7901 Long term (current) use of anticoagulants: Secondary | ICD-10-CM | POA: Diagnosis not present

## 2020-11-30 DIAGNOSIS — E039 Hypothyroidism, unspecified: Secondary | ICD-10-CM | POA: Diagnosis not present

## 2020-11-30 DIAGNOSIS — I10 Essential (primary) hypertension: Secondary | ICD-10-CM | POA: Diagnosis not present

## 2020-11-30 DIAGNOSIS — J449 Chronic obstructive pulmonary disease, unspecified: Secondary | ICD-10-CM | POA: Diagnosis not present

## 2020-11-30 DIAGNOSIS — D649 Anemia, unspecified: Secondary | ICD-10-CM | POA: Diagnosis not present

## 2020-11-30 DIAGNOSIS — I480 Paroxysmal atrial fibrillation: Secondary | ICD-10-CM | POA: Diagnosis not present

## 2020-11-30 DIAGNOSIS — J9611 Chronic respiratory failure with hypoxia: Secondary | ICD-10-CM | POA: Diagnosis not present

## 2020-12-02 ENCOUNTER — Telehealth: Payer: Self-pay | Admitting: Cardiovascular Disease

## 2020-12-02 DIAGNOSIS — G4700 Insomnia, unspecified: Secondary | ICD-10-CM | POA: Diagnosis not present

## 2020-12-02 DIAGNOSIS — E785 Hyperlipidemia, unspecified: Secondary | ICD-10-CM | POA: Diagnosis not present

## 2020-12-02 DIAGNOSIS — E039 Hypothyroidism, unspecified: Secondary | ICD-10-CM | POA: Diagnosis not present

## 2020-12-02 DIAGNOSIS — I480 Paroxysmal atrial fibrillation: Secondary | ICD-10-CM | POA: Diagnosis not present

## 2020-12-02 DIAGNOSIS — J449 Chronic obstructive pulmonary disease, unspecified: Secondary | ICD-10-CM | POA: Diagnosis not present

## 2020-12-02 DIAGNOSIS — M810 Age-related osteoporosis without current pathological fracture: Secondary | ICD-10-CM | POA: Diagnosis not present

## 2020-12-02 DIAGNOSIS — I10 Essential (primary) hypertension: Secondary | ICD-10-CM | POA: Diagnosis not present

## 2020-12-02 DIAGNOSIS — J441 Chronic obstructive pulmonary disease with (acute) exacerbation: Secondary | ICD-10-CM | POA: Diagnosis not present

## 2020-12-02 MED ORDER — APIXABAN 5 MG PO TABS: 5.0000 mg | ORAL_TABLET | Freq: Two times a day (BID) | ORAL | 10 refills | Status: DC

## 2020-12-02 MED ORDER — DILTIAZEM HCL ER COATED BEADS 120 MG PO CP24: 120.0000 mg | ORAL_CAPSULE | Freq: Every day | ORAL | 10 refills | Status: DC

## 2020-12-02 NOTE — Telephone Encounter (Signed)
°*  STAT* If patient is at the pharmacy, call can be transferred to refill team.   1. Which medications need to be refilled? (please list name of each medication and dose if known)  diltiazem (CARDIZEM CD) 120 MG 24 hr capsule apixaban (ELIQUIS) 5 MG TABS tablet  2. Which pharmacy/location (including street and city if local pharmacy) is medication to be sent to? [facility] - [place], [place] - [place] [address]  3. Do they need a 30 day or 90 day supply? 90 day   Pharmacy states they need prescriptions to be able to delivery it by Friday. She states the patient is almost out of medication

## 2020-12-03 DIAGNOSIS — N10 Acute tubulo-interstitial nephritis: Secondary | ICD-10-CM | POA: Diagnosis not present

## 2020-12-03 DIAGNOSIS — E785 Hyperlipidemia, unspecified: Secondary | ICD-10-CM | POA: Diagnosis not present

## 2020-12-03 DIAGNOSIS — J9611 Chronic respiratory failure with hypoxia: Secondary | ICD-10-CM | POA: Diagnosis not present

## 2020-12-03 DIAGNOSIS — I10 Essential (primary) hypertension: Secondary | ICD-10-CM | POA: Diagnosis not present

## 2020-12-03 DIAGNOSIS — D649 Anemia, unspecified: Secondary | ICD-10-CM | POA: Diagnosis not present

## 2020-12-03 DIAGNOSIS — J449 Chronic obstructive pulmonary disease, unspecified: Secondary | ICD-10-CM | POA: Diagnosis not present

## 2020-12-03 DIAGNOSIS — I480 Paroxysmal atrial fibrillation: Secondary | ICD-10-CM | POA: Diagnosis not present

## 2020-12-03 DIAGNOSIS — N3090 Cystitis, unspecified without hematuria: Secondary | ICD-10-CM | POA: Diagnosis not present

## 2020-12-03 DIAGNOSIS — Z7901 Long term (current) use of anticoagulants: Secondary | ICD-10-CM | POA: Diagnosis not present

## 2020-12-03 DIAGNOSIS — E039 Hypothyroidism, unspecified: Secondary | ICD-10-CM | POA: Diagnosis not present

## 2020-12-08 DIAGNOSIS — E785 Hyperlipidemia, unspecified: Secondary | ICD-10-CM | POA: Diagnosis not present

## 2020-12-08 DIAGNOSIS — D649 Anemia, unspecified: Secondary | ICD-10-CM | POA: Diagnosis not present

## 2020-12-08 DIAGNOSIS — E039 Hypothyroidism, unspecified: Secondary | ICD-10-CM | POA: Diagnosis not present

## 2020-12-08 DIAGNOSIS — J449 Chronic obstructive pulmonary disease, unspecified: Secondary | ICD-10-CM | POA: Diagnosis not present

## 2020-12-08 DIAGNOSIS — Z7901 Long term (current) use of anticoagulants: Secondary | ICD-10-CM | POA: Diagnosis not present

## 2020-12-08 DIAGNOSIS — I480 Paroxysmal atrial fibrillation: Secondary | ICD-10-CM | POA: Diagnosis not present

## 2020-12-08 DIAGNOSIS — N10 Acute tubulo-interstitial nephritis: Secondary | ICD-10-CM | POA: Diagnosis not present

## 2020-12-08 DIAGNOSIS — N3090 Cystitis, unspecified without hematuria: Secondary | ICD-10-CM | POA: Diagnosis not present

## 2020-12-08 DIAGNOSIS — I10 Essential (primary) hypertension: Secondary | ICD-10-CM | POA: Diagnosis not present

## 2020-12-08 DIAGNOSIS — J9611 Chronic respiratory failure with hypoxia: Secondary | ICD-10-CM | POA: Diagnosis not present

## 2020-12-09 DIAGNOSIS — D649 Anemia, unspecified: Secondary | ICD-10-CM | POA: Diagnosis not present

## 2020-12-10 DIAGNOSIS — I10 Essential (primary) hypertension: Secondary | ICD-10-CM | POA: Diagnosis not present

## 2020-12-10 DIAGNOSIS — E039 Hypothyroidism, unspecified: Secondary | ICD-10-CM | POA: Diagnosis not present

## 2020-12-10 DIAGNOSIS — D649 Anemia, unspecified: Secondary | ICD-10-CM | POA: Diagnosis not present

## 2020-12-10 DIAGNOSIS — N3090 Cystitis, unspecified without hematuria: Secondary | ICD-10-CM | POA: Diagnosis not present

## 2020-12-10 DIAGNOSIS — E785 Hyperlipidemia, unspecified: Secondary | ICD-10-CM | POA: Diagnosis not present

## 2020-12-10 DIAGNOSIS — J9611 Chronic respiratory failure with hypoxia: Secondary | ICD-10-CM | POA: Diagnosis not present

## 2020-12-10 DIAGNOSIS — I480 Paroxysmal atrial fibrillation: Secondary | ICD-10-CM | POA: Diagnosis not present

## 2020-12-10 DIAGNOSIS — J449 Chronic obstructive pulmonary disease, unspecified: Secondary | ICD-10-CM | POA: Diagnosis not present

## 2020-12-10 DIAGNOSIS — Z7901 Long term (current) use of anticoagulants: Secondary | ICD-10-CM | POA: Diagnosis not present

## 2020-12-10 DIAGNOSIS — N10 Acute tubulo-interstitial nephritis: Secondary | ICD-10-CM | POA: Diagnosis not present

## 2020-12-14 DIAGNOSIS — J449 Chronic obstructive pulmonary disease, unspecified: Secondary | ICD-10-CM | POA: Diagnosis not present

## 2020-12-14 DIAGNOSIS — E785 Hyperlipidemia, unspecified: Secondary | ICD-10-CM | POA: Diagnosis not present

## 2020-12-14 DIAGNOSIS — E039 Hypothyroidism, unspecified: Secondary | ICD-10-CM | POA: Diagnosis not present

## 2020-12-14 DIAGNOSIS — Z7901 Long term (current) use of anticoagulants: Secondary | ICD-10-CM | POA: Diagnosis not present

## 2020-12-14 DIAGNOSIS — I10 Essential (primary) hypertension: Secondary | ICD-10-CM | POA: Diagnosis not present

## 2020-12-14 DIAGNOSIS — N10 Acute tubulo-interstitial nephritis: Secondary | ICD-10-CM | POA: Diagnosis not present

## 2020-12-14 DIAGNOSIS — N3090 Cystitis, unspecified without hematuria: Secondary | ICD-10-CM | POA: Diagnosis not present

## 2020-12-14 DIAGNOSIS — J9611 Chronic respiratory failure with hypoxia: Secondary | ICD-10-CM | POA: Diagnosis not present

## 2020-12-14 DIAGNOSIS — I480 Paroxysmal atrial fibrillation: Secondary | ICD-10-CM | POA: Diagnosis not present

## 2020-12-14 DIAGNOSIS — D649 Anemia, unspecified: Secondary | ICD-10-CM | POA: Diagnosis not present

## 2020-12-18 DIAGNOSIS — I480 Paroxysmal atrial fibrillation: Secondary | ICD-10-CM | POA: Diagnosis not present

## 2020-12-18 DIAGNOSIS — N10 Acute tubulo-interstitial nephritis: Secondary | ICD-10-CM | POA: Diagnosis not present

## 2020-12-18 DIAGNOSIS — J449 Chronic obstructive pulmonary disease, unspecified: Secondary | ICD-10-CM | POA: Diagnosis not present

## 2020-12-18 DIAGNOSIS — D649 Anemia, unspecified: Secondary | ICD-10-CM | POA: Diagnosis not present

## 2020-12-18 DIAGNOSIS — I10 Essential (primary) hypertension: Secondary | ICD-10-CM | POA: Diagnosis not present

## 2020-12-18 DIAGNOSIS — E785 Hyperlipidemia, unspecified: Secondary | ICD-10-CM | POA: Diagnosis not present

## 2020-12-18 DIAGNOSIS — J9611 Chronic respiratory failure with hypoxia: Secondary | ICD-10-CM | POA: Diagnosis not present

## 2020-12-18 DIAGNOSIS — Z7901 Long term (current) use of anticoagulants: Secondary | ICD-10-CM | POA: Diagnosis not present

## 2020-12-18 DIAGNOSIS — E039 Hypothyroidism, unspecified: Secondary | ICD-10-CM | POA: Diagnosis not present

## 2020-12-18 DIAGNOSIS — N3090 Cystitis, unspecified without hematuria: Secondary | ICD-10-CM | POA: Diagnosis not present

## 2020-12-21 DIAGNOSIS — I480 Paroxysmal atrial fibrillation: Secondary | ICD-10-CM | POA: Diagnosis not present

## 2020-12-21 DIAGNOSIS — E785 Hyperlipidemia, unspecified: Secondary | ICD-10-CM | POA: Diagnosis not present

## 2020-12-21 DIAGNOSIS — J449 Chronic obstructive pulmonary disease, unspecified: Secondary | ICD-10-CM | POA: Diagnosis not present

## 2020-12-21 DIAGNOSIS — N3090 Cystitis, unspecified without hematuria: Secondary | ICD-10-CM | POA: Diagnosis not present

## 2020-12-21 DIAGNOSIS — I10 Essential (primary) hypertension: Secondary | ICD-10-CM | POA: Diagnosis not present

## 2020-12-21 DIAGNOSIS — J9611 Chronic respiratory failure with hypoxia: Secondary | ICD-10-CM | POA: Diagnosis not present

## 2020-12-21 DIAGNOSIS — D649 Anemia, unspecified: Secondary | ICD-10-CM | POA: Diagnosis not present

## 2020-12-21 DIAGNOSIS — Z7901 Long term (current) use of anticoagulants: Secondary | ICD-10-CM | POA: Diagnosis not present

## 2020-12-21 DIAGNOSIS — E039 Hypothyroidism, unspecified: Secondary | ICD-10-CM | POA: Diagnosis not present

## 2020-12-21 DIAGNOSIS — N10 Acute tubulo-interstitial nephritis: Secondary | ICD-10-CM | POA: Diagnosis not present

## 2020-12-23 DIAGNOSIS — D649 Anemia, unspecified: Secondary | ICD-10-CM | POA: Diagnosis not present

## 2020-12-23 DIAGNOSIS — E785 Hyperlipidemia, unspecified: Secondary | ICD-10-CM | POA: Diagnosis not present

## 2020-12-23 DIAGNOSIS — N10 Acute tubulo-interstitial nephritis: Secondary | ICD-10-CM | POA: Diagnosis not present

## 2020-12-23 DIAGNOSIS — E039 Hypothyroidism, unspecified: Secondary | ICD-10-CM | POA: Diagnosis not present

## 2020-12-23 DIAGNOSIS — J449 Chronic obstructive pulmonary disease, unspecified: Secondary | ICD-10-CM | POA: Diagnosis not present

## 2020-12-23 DIAGNOSIS — Z7901 Long term (current) use of anticoagulants: Secondary | ICD-10-CM | POA: Diagnosis not present

## 2020-12-23 DIAGNOSIS — N3090 Cystitis, unspecified without hematuria: Secondary | ICD-10-CM | POA: Diagnosis not present

## 2020-12-23 DIAGNOSIS — I10 Essential (primary) hypertension: Secondary | ICD-10-CM | POA: Diagnosis not present

## 2020-12-23 DIAGNOSIS — J9611 Chronic respiratory failure with hypoxia: Secondary | ICD-10-CM | POA: Diagnosis not present

## 2020-12-23 DIAGNOSIS — I480 Paroxysmal atrial fibrillation: Secondary | ICD-10-CM | POA: Diagnosis not present

## 2021-01-08 DIAGNOSIS — J449 Chronic obstructive pulmonary disease, unspecified: Secondary | ICD-10-CM | POA: Diagnosis not present

## 2021-01-11 DIAGNOSIS — E7800 Pure hypercholesterolemia, unspecified: Secondary | ICD-10-CM | POA: Diagnosis not present

## 2021-01-11 DIAGNOSIS — I480 Paroxysmal atrial fibrillation: Secondary | ICD-10-CM | POA: Diagnosis not present

## 2021-01-11 DIAGNOSIS — I10 Essential (primary) hypertension: Secondary | ICD-10-CM | POA: Diagnosis not present

## 2021-01-11 DIAGNOSIS — E039 Hypothyroidism, unspecified: Secondary | ICD-10-CM | POA: Diagnosis not present

## 2021-01-11 DIAGNOSIS — G4700 Insomnia, unspecified: Secondary | ICD-10-CM | POA: Diagnosis not present

## 2021-01-11 DIAGNOSIS — M810 Age-related osteoporosis without current pathological fracture: Secondary | ICD-10-CM | POA: Diagnosis not present

## 2021-01-11 DIAGNOSIS — J449 Chronic obstructive pulmonary disease, unspecified: Secondary | ICD-10-CM | POA: Diagnosis not present

## 2021-01-11 DIAGNOSIS — E785 Hyperlipidemia, unspecified: Secondary | ICD-10-CM | POA: Diagnosis not present

## 2021-02-05 ENCOUNTER — Ambulatory Visit: Payer: Medicare HMO | Admitting: Cardiovascular Disease

## 2021-02-07 DIAGNOSIS — J449 Chronic obstructive pulmonary disease, unspecified: Secondary | ICD-10-CM | POA: Diagnosis not present

## 2021-02-19 DIAGNOSIS — E785 Hyperlipidemia, unspecified: Secondary | ICD-10-CM | POA: Diagnosis not present

## 2021-02-22 DIAGNOSIS — E785 Hyperlipidemia, unspecified: Secondary | ICD-10-CM | POA: Diagnosis not present

## 2021-02-22 DIAGNOSIS — I10 Essential (primary) hypertension: Secondary | ICD-10-CM | POA: Diagnosis not present

## 2021-02-22 DIAGNOSIS — E7800 Pure hypercholesterolemia, unspecified: Secondary | ICD-10-CM | POA: Diagnosis not present

## 2021-02-22 DIAGNOSIS — J441 Chronic obstructive pulmonary disease with (acute) exacerbation: Secondary | ICD-10-CM | POA: Diagnosis not present

## 2021-02-22 DIAGNOSIS — I480 Paroxysmal atrial fibrillation: Secondary | ICD-10-CM | POA: Diagnosis not present

## 2021-02-22 DIAGNOSIS — E039 Hypothyroidism, unspecified: Secondary | ICD-10-CM | POA: Diagnosis not present

## 2021-02-22 DIAGNOSIS — G4700 Insomnia, unspecified: Secondary | ICD-10-CM | POA: Diagnosis not present

## 2021-02-22 DIAGNOSIS — M810 Age-related osteoporosis without current pathological fracture: Secondary | ICD-10-CM | POA: Diagnosis not present

## 2021-02-22 DIAGNOSIS — J449 Chronic obstructive pulmonary disease, unspecified: Secondary | ICD-10-CM | POA: Diagnosis not present

## 2021-03-10 DIAGNOSIS — J449 Chronic obstructive pulmonary disease, unspecified: Secondary | ICD-10-CM | POA: Diagnosis not present

## 2021-03-21 ENCOUNTER — Other Ambulatory Visit: Payer: Self-pay | Admitting: Internal Medicine

## 2021-04-01 DIAGNOSIS — E785 Hyperlipidemia, unspecified: Secondary | ICD-10-CM | POA: Diagnosis not present

## 2021-04-01 DIAGNOSIS — E7800 Pure hypercholesterolemia, unspecified: Secondary | ICD-10-CM | POA: Diagnosis not present

## 2021-04-01 DIAGNOSIS — I480 Paroxysmal atrial fibrillation: Secondary | ICD-10-CM | POA: Diagnosis not present

## 2021-04-01 DIAGNOSIS — G8929 Other chronic pain: Secondary | ICD-10-CM | POA: Diagnosis not present

## 2021-04-01 DIAGNOSIS — M810 Age-related osteoporosis without current pathological fracture: Secondary | ICD-10-CM | POA: Diagnosis not present

## 2021-04-01 DIAGNOSIS — J449 Chronic obstructive pulmonary disease, unspecified: Secondary | ICD-10-CM | POA: Diagnosis not present

## 2021-04-01 DIAGNOSIS — I10 Essential (primary) hypertension: Secondary | ICD-10-CM | POA: Diagnosis not present

## 2021-04-01 DIAGNOSIS — G4700 Insomnia, unspecified: Secondary | ICD-10-CM | POA: Diagnosis not present

## 2021-04-01 DIAGNOSIS — J441 Chronic obstructive pulmonary disease with (acute) exacerbation: Secondary | ICD-10-CM | POA: Diagnosis not present

## 2021-04-01 DIAGNOSIS — E039 Hypothyroidism, unspecified: Secondary | ICD-10-CM | POA: Diagnosis not present

## 2021-04-03 ENCOUNTER — Ambulatory Visit (HOSPITAL_COMMUNITY)
Admission: EM | Admit: 2021-04-03 | Discharge: 2021-04-03 | Disposition: A | Discharge status: Home / Self Care | Payer: Medicare HMO | Attending: Family Medicine | Admitting: Family Medicine

## 2021-04-03 ENCOUNTER — Encounter (HOSPITAL_COMMUNITY): Payer: Self-pay | Admitting: *Deleted

## 2021-04-03 ENCOUNTER — Other Ambulatory Visit: Payer: Self-pay

## 2021-04-03 DIAGNOSIS — M545 Low back pain, unspecified: Secondary | ICD-10-CM | POA: Diagnosis not present

## 2021-04-03 DIAGNOSIS — G8929 Other chronic pain: Secondary | ICD-10-CM

## 2021-04-03 MED ORDER — PREDNISONE 20 MG PO TABS: 20.0000 mg | ORAL_TABLET | Freq: Every day | ORAL | 0 refills | Status: DC

## 2021-04-03 MED ORDER — DICLOFENAC SODIUM 1 % EX GEL: 2.0000 g | Freq: Four times a day (QID) | CUTANEOUS | 0 refills | Status: AC

## 2021-04-03 NOTE — ED Triage Notes (Signed)
Pt reports lower back pain was worse starting yesterday.
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
## 2021-04-09 DIAGNOSIS — J449 Chronic obstructive pulmonary disease, unspecified: Secondary | ICD-10-CM | POA: Diagnosis not present

## 2021-04-12 ENCOUNTER — Ambulatory Visit
Admission: RE | Admit: 2021-04-12 | Discharge: 2021-04-12 | Disposition: A | Discharge status: Home / Self Care | Payer: Medicare HMO | Source: Ambulatory Visit | Attending: Internal Medicine | Admitting: Internal Medicine

## 2021-04-12 ENCOUNTER — Other Ambulatory Visit: Payer: Self-pay | Admitting: Internal Medicine

## 2021-04-12 DIAGNOSIS — Z1389 Encounter for screening for other disorder: Secondary | ICD-10-CM | POA: Diagnosis not present

## 2021-04-12 DIAGNOSIS — M545 Low back pain, unspecified: Secondary | ICD-10-CM

## 2021-04-12 DIAGNOSIS — I480 Paroxysmal atrial fibrillation: Secondary | ICD-10-CM | POA: Diagnosis not present

## 2021-04-12 DIAGNOSIS — I4892 Unspecified atrial flutter: Secondary | ICD-10-CM | POA: Diagnosis not present

## 2021-04-12 DIAGNOSIS — D6869 Other thrombophilia: Secondary | ICD-10-CM | POA: Diagnosis not present

## 2021-04-12 DIAGNOSIS — R7303 Prediabetes: Secondary | ICD-10-CM | POA: Diagnosis not present

## 2021-04-12 DIAGNOSIS — I10 Essential (primary) hypertension: Secondary | ICD-10-CM | POA: Diagnosis not present

## 2021-04-12 DIAGNOSIS — R7309 Other abnormal glucose: Secondary | ICD-10-CM | POA: Diagnosis not present

## 2021-04-12 DIAGNOSIS — J449 Chronic obstructive pulmonary disease, unspecified: Secondary | ICD-10-CM | POA: Diagnosis not present

## 2021-04-12 DIAGNOSIS — I700 Atherosclerosis of aorta: Secondary | ICD-10-CM | POA: Diagnosis not present

## 2021-04-12 DIAGNOSIS — M810 Age-related osteoporosis without current pathological fracture: Secondary | ICD-10-CM | POA: Diagnosis not present

## 2021-04-12 DIAGNOSIS — Z0000 Encounter for general adult medical examination without abnormal findings: Secondary | ICD-10-CM | POA: Diagnosis not present

## 2021-04-12 DIAGNOSIS — E7800 Pure hypercholesterolemia, unspecified: Secondary | ICD-10-CM | POA: Diagnosis not present

## 2021-04-12 DIAGNOSIS — E039 Hypothyroidism, unspecified: Secondary | ICD-10-CM | POA: Diagnosis not present

## 2021-04-30 DIAGNOSIS — J449 Chronic obstructive pulmonary disease, unspecified: Secondary | ICD-10-CM | POA: Diagnosis not present

## 2021-04-30 DIAGNOSIS — M810 Age-related osteoporosis without current pathological fracture: Secondary | ICD-10-CM | POA: Diagnosis not present

## 2021-04-30 DIAGNOSIS — I480 Paroxysmal atrial fibrillation: Secondary | ICD-10-CM | POA: Diagnosis not present

## 2021-04-30 DIAGNOSIS — J441 Chronic obstructive pulmonary disease with (acute) exacerbation: Secondary | ICD-10-CM | POA: Diagnosis not present

## 2021-04-30 DIAGNOSIS — G8929 Other chronic pain: Secondary | ICD-10-CM | POA: Diagnosis not present

## 2021-04-30 DIAGNOSIS — G4700 Insomnia, unspecified: Secondary | ICD-10-CM | POA: Diagnosis not present

## 2021-04-30 DIAGNOSIS — E039 Hypothyroidism, unspecified: Secondary | ICD-10-CM | POA: Diagnosis not present

## 2021-04-30 DIAGNOSIS — E785 Hyperlipidemia, unspecified: Secondary | ICD-10-CM | POA: Diagnosis not present

## 2021-04-30 DIAGNOSIS — I10 Essential (primary) hypertension: Secondary | ICD-10-CM | POA: Diagnosis not present

## 2021-04-30 DIAGNOSIS — E7800 Pure hypercholesterolemia, unspecified: Secondary | ICD-10-CM | POA: Diagnosis not present

## 2021-05-10 DIAGNOSIS — J449 Chronic obstructive pulmonary disease, unspecified: Secondary | ICD-10-CM | POA: Diagnosis not present

## 2021-05-21 ENCOUNTER — Encounter: Payer: Self-pay | Admitting: Cardiovascular Disease

## 2021-05-21 ENCOUNTER — Other Ambulatory Visit: Payer: Self-pay

## 2021-05-21 ENCOUNTER — Ambulatory Visit: Payer: Medicare HMO | Admitting: Cardiovascular Disease

## 2021-05-21 VITALS — BP 118/68 | HR 89 | Resp 20 | Ht 61.0 in | Wt 102.4 lb

## 2021-05-21 DIAGNOSIS — I483 Typical atrial flutter: Secondary | ICD-10-CM | POA: Diagnosis not present

## 2021-05-21 DIAGNOSIS — Z79899 Other long term (current) drug therapy: Secondary | ICD-10-CM

## 2021-05-21 DIAGNOSIS — J449 Chronic obstructive pulmonary disease, unspecified: Secondary | ICD-10-CM | POA: Diagnosis not present

## 2021-05-21 DIAGNOSIS — I2781 Cor pulmonale (chronic): Secondary | ICD-10-CM

## 2021-05-21 DIAGNOSIS — J9611 Chronic respiratory failure with hypoxia: Secondary | ICD-10-CM | POA: Diagnosis not present

## 2021-05-21 DIAGNOSIS — E7800 Pure hypercholesterolemia, unspecified: Secondary | ICD-10-CM | POA: Diagnosis not present
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
## 2021-05-21 NOTE — Patient Instructions (Signed)
Medication Instructions:  No chnages *If you need a refill on your cardiac medications before your next appointment, please call your pharmacy*   Lab Work: None ordered If you have labs (blood work) drawn today and your tests are completely normal, you will receive your results only by: [place] (if you have MyChart) OR A paper copy in the mail If you have any lab test that is abnormal or we need to change your treatment, we will call you to review the results.   Testing/Procedures: None ordered   Follow-Up: At [facility], you and your health needs are our priority.  As part of our continuing mission to provide you with exceptional heart care, we have created designated Provider Care Teams.  These Care Teams include your primary Cardiologist (physician) and Advanced Practice Providers (APPs -  Physician Assistants and Nurse Practitioners) who all work together to provide you with the care you need, when you need it.  We recommend signing up for the patient portal called "MyChart".  Sign up information is provided on this After Visit Summary.  MyChart is used to connect with patients for Virtual Visits (Telemedicine).  Patients are able to view lab/test results, encounter notes, upcoming appointments, etc.  Non-urgent messages can be sent to your provider as well.   To learn more about what you can do with MyChart, go to [email or web address].    Your next appointment:   12 month(s)  The format for your next appointment:   In Person  Provider:   You may see Sanda Klein, MD or one of the following Advanced Practice Providers on your designated Care Team:   Almyra Deforest, PA-C Fabian Sharp, PA-C or  Roby Lofts, [place]

## 2021-06-02 ENCOUNTER — Ambulatory Visit: Payer: Medicare HMO | Admitting: Internal Medicine

## 2021-06-10 ENCOUNTER — Ambulatory Visit: Payer: Medicare HMO | Admitting: Internal Medicine

## 2021-06-10 DIAGNOSIS — J449 Chronic obstructive pulmonary disease, unspecified: Secondary | ICD-10-CM | POA: Diagnosis not present

## 2021-06-20 ENCOUNTER — Other Ambulatory Visit: Payer: Self-pay | Admitting: Internal Medicine

## 2021-06-25 ENCOUNTER — Other Ambulatory Visit: Payer: Self-pay | Admitting: Internal Medicine

## 2021-06-29 ENCOUNTER — Telehealth: Payer: Self-pay | Admitting: Internal Medicine

## 2021-06-29 DIAGNOSIS — I10 Essential (primary) hypertension: Secondary | ICD-10-CM | POA: Diagnosis not present

## 2021-06-29 DIAGNOSIS — E039 Hypothyroidism, unspecified: Secondary | ICD-10-CM | POA: Diagnosis not present

## 2021-06-29 DIAGNOSIS — G4700 Insomnia, unspecified: Secondary | ICD-10-CM | POA: Diagnosis not present

## 2021-06-29 DIAGNOSIS — M810 Age-related osteoporosis without current pathological fracture: Secondary | ICD-10-CM | POA: Diagnosis not present

## 2021-06-29 DIAGNOSIS — E7800 Pure hypercholesterolemia, unspecified: Secondary | ICD-10-CM | POA: Diagnosis not present

## 2021-06-29 DIAGNOSIS — J441 Chronic obstructive pulmonary disease with (acute) exacerbation: Secondary | ICD-10-CM | POA: Diagnosis not present

## 2021-06-29 DIAGNOSIS — E785 Hyperlipidemia, unspecified: Secondary | ICD-10-CM | POA: Diagnosis not present

## 2021-06-29 DIAGNOSIS — J449 Chronic obstructive pulmonary disease, unspecified: Secondary | ICD-10-CM | POA: Diagnosis not present

## 2021-06-29 DIAGNOSIS — I480 Paroxysmal atrial fibrillation: Secondary | ICD-10-CM | POA: Diagnosis not present

## 2021-06-29 DIAGNOSIS — G8929 Other chronic pain: Secondary | ICD-10-CM | POA: Diagnosis not present

## 2021-06-29 MED ORDER — ANORO ELLIPTA 62.5-25 MCG/INH IN AEPB: 1.0000 | INHALATION_SPRAY | Freq: Every day | RESPIRATORY_TRACT | 0 refills | Status: DC

## 2021-06-29 NOTE — Telephone Encounter (Signed)
I have called and spoke with pt and she is scheduled to see MW on 10/18 at 1015.  She is aware that the Anoro has been sent to the pharmacy and nothing further is needed.

## 2021-07-05 DIAGNOSIS — G4700 Insomnia, unspecified: Secondary | ICD-10-CM | POA: Diagnosis not present

## 2021-07-05 DIAGNOSIS — J449 Chronic obstructive pulmonary disease, unspecified: Secondary | ICD-10-CM | POA: Diagnosis not present

## 2021-07-05 DIAGNOSIS — G8929 Other chronic pain: Secondary | ICD-10-CM | POA: Diagnosis not present

## 2021-07-05 DIAGNOSIS — J441 Chronic obstructive pulmonary disease with (acute) exacerbation: Secondary | ICD-10-CM | POA: Diagnosis not present

## 2021-07-05 DIAGNOSIS — E7800 Pure hypercholesterolemia, unspecified: Secondary | ICD-10-CM | POA: Diagnosis not present

## 2021-07-05 DIAGNOSIS — M810 Age-related osteoporosis without current pathological fracture: Secondary | ICD-10-CM | POA: Diagnosis not present

## 2021-07-05 DIAGNOSIS — E785 Hyperlipidemia, unspecified: Secondary | ICD-10-CM | POA: Diagnosis not present

## 2021-07-05 DIAGNOSIS — I480 Paroxysmal atrial fibrillation: Secondary | ICD-10-CM | POA: Diagnosis not present

## 2021-07-05 DIAGNOSIS — I10 Essential (primary) hypertension: Secondary | ICD-10-CM | POA: Diagnosis not present

## 2021-07-05 DIAGNOSIS — E039 Hypothyroidism, unspecified: Secondary | ICD-10-CM | POA: Diagnosis not present

## 2021-07-10 DIAGNOSIS — J449 Chronic obstructive pulmonary disease, unspecified: Secondary | ICD-10-CM | POA: Diagnosis not present

## 2021-07-20 ENCOUNTER — Ambulatory Visit: Payer: Medicare HMO | Admitting: Internal Medicine

## 2021-07-26 ENCOUNTER — Ambulatory Visit: Payer: Medicare HMO | Admitting: Internal Medicine

## 2021-07-26 ENCOUNTER — Encounter: Payer: Self-pay | Admitting: Internal Medicine

## 2021-07-26 ENCOUNTER — Other Ambulatory Visit: Payer: Self-pay

## 2021-07-26 DIAGNOSIS — J9611 Chronic respiratory failure with hypoxia: Secondary | ICD-10-CM | POA: Diagnosis not present

## 2021-07-26 DIAGNOSIS — J449 Chronic obstructive pulmonary disease, unspecified: Secondary | ICD-10-CM | POA: Diagnosis not present

## 2021-07-26 DIAGNOSIS — J9612 Chronic respiratory failure with hypercapnia: Secondary | ICD-10-CM

## 2021-07-26 NOTE — Patient Instructions (Signed)
No change in medications   Make sure you check your oxygen saturation  at your highest level of activity  to be sure it stays over 90% and adjust  02 flow upward to maintain this level if needed but remember to turn it back to previous settings when you stop (to conserve your supply).    Please schedule a follow up visit in 12 months but call sooner if needed

## 2021-07-26 NOTE — Progress Notes (Signed)
Subjective:    [name], female    DOB: Jun 13, 1951   MRN: [id number]      Brief patient profile:  62  yobf  Quit smoking 2010  with  GOLD III criteria copd / 0 2 dep     Significant tests/ events  She was seen by Dr Gwenette Greet 12/24/08 after hospitalization for LUL pna. CT angio 3/10 showed centrilobular emphysema.She was placed on oxygen around this time [name] .  PFTs  12/2008 FEv1 39%, DLCO 35%, mild air trapping    History of Present Illness  08/28/2015 acute extended ov/Imonie Tuch re: GOLD III copd/ on spiriva and freq saba and 02 2lpm hs/ 3lpm with activity  Chief Complaint  Patient presents with   Acute Visit    Pt c/o increased SOB for the past few wks- "I have attacks"- relates to the weather change. She takes albuterol inhaler 2 x per day on average and uses neb 1-2 x per day.   baseline doe = MMRC2 = can't walk a nl pace on a flat grade s sob  >>Stiolto rx       03/31/2016 acute extended ov/Arrington Bencomo re: aecopd on anoro req by insurance   2lpm rest/3lpm  Chief Complaint  Patient presents with   Acute Visit    Pt c/o increased DOE for the past 1-2 wks. She has been using neb at least once per day.  She also c/o facial puffiness and HA "I have had a head cold". She has had minimal cough with white sputum.     easily confused with details of care/ overusing neb and not using saba hfa   Not clear what the alternatives to anoro are. Doe = MMRC3 = can't walk 100 yards even at a slow pace at a flat grade s stopping due to sob  On 3lpm and uses w/c usually to go out.  rec Plan A = Automatic = Stiolto 2 each am  Plan B = Backup Only use your albuterol as a rescue medication  Plan C = Crisis - only use your albuterol nebulizer if you first try Plan B and it fails to help > ok to use the nebulizer up to every 4 hours but if start needing it regularly call for immediate appointment Plan D = Doctor - call me if B and C not adequate Plan E = ER - go to ER or  call 911 if all else fails   Prednisone 10 mg take  4 each am x 2 days,   2 each am x 2 days,  1 each am x 2 days and stop          09/13/2017  f/u ov/[name] re: copd  GOLD III/ still in pulmonary rehab reliant on samples 3lpm with activity/ 2lpm  Chief Complaint  Patient presents with   Follow-up    Pt states that she has been good since last visit. Pt was given sample of Bevespi but does not think she is getting enough effect out of the medication and wants to switch to a different inhaler. States that she has been going to rehab and feels she is becoming stronger. DME: [facility], 3L with ambulation and 2L at rest.   never needs any albuterol in any form  Doe = MMRC2 = can't walk a nl pace on a flat grade s sob but does fine slow and flat on 02 pushing cart  Sleeping fine on 2lpm  Improved overall  in rehab  Sleeping ok flat on 2lpm  without nocturnal  or early am exacerbation  of respiratory  c/o's or need for noct saba. Also denies any obvious fluctuation of symptoms with weather or environmental changes or other aggravating or alleviating factors except as outlined above     06/02/2020  f/u ov/[name] re:  GOLD III  Chief Complaint  Patient presents with   Follow-up    Pt doing well. Denies any problems   Dyspnea:  No longer walking mb / just in house - walks [employer] pushing on buggy  Cough: no  Sleeping: one pillow bed is flat  SABA use: none  02: 2.5lpm and 4 portable pulsed  Rec Make sure you check your oxygen saturations at highest level of activity to be sure it stays over 90%   For now you need 3lpm from your present machine or 3lpm from Portable concentrator when you walk but nothing needed sitting at rest so save your 02 for when your saturation level is less than 90%     07/26/2021  f/u ov/[name] re: GOLD 3    maint on  anoro   Chief Complaint  Patient presents with   Follow-up    1 yr follow up: SOB has been the same.    Dyspnea:  15 on x bike s stopping on  90s/ 3lpm  Cough: none  Sleeping: bed is flat 2 pillows  SABA use: rarely  02: 2lpm at rest/ hs and up to 3lpm / sats runing low 90s  Covid status:   vax x 3 /  sept 2023 likley variant    No obvious day to day or daytime variability or assoc excess/ purulent sputum or mucus plugs or hemoptysis or cp or chest tightness, subjective wheeze or overt sinus or hb symptoms.   Sleeping fine  without nocturnal  or early am exacerbation  of respiratory  c/o's or need for noct saba. Also denies any obvious fluctuation of symptoms with weather or environmental changes or other aggravating or alleviating factors except as outlined above   No unusual exposure hx or h/o childhood pna/ asthma or knowledge of premature birth.  Current Allergies, Complete Past Medical History, Past Surgical History, Family History, and Social History were reviewed in [employer] record.  ROS  The following are not active complaints unless bolded Hoarseness, sore throat, dysphagia, dental problems, itching, sneezing,  nasal congestion or discharge of excess mucus or purulent secretions, ear ache,   fever, chills, sweats, unintended wt loss or wt gain, classically pleuritic or exertional cp,  orthopnea pnd or arm/hand swelling  or leg swelling, presyncope, palpitations, abdominal pain, anorexia, nausea, vomiting, diarrhea  or change in bowel habits or change in bladder habits, change in stools or change in urine, dysuria, hematuria,  rash, arthralgias, visual complaints, headache, numbness, weakness or ataxia or problems with walking or coordination,  change in mood or  memory.        Current Meds  Medication Sig   albuterol (PROVENTIL) (2.5 MG/3ML) 0.083% nebulizer solution Take 3 mLs (2.5 mg total) by nebulization every 2 (two) hours as needed for wheezing or shortness of breath.   albuterol (VENTOLIN HFA) 108 (90 Base) MCG/ACT inhaler Inhale 1-2 puffs into the lungs every 6 (six) hours as needed for  wheezing or shortness of breath. Take 1-2 puffs as needed for wheezing and shortness of breath   alendronate (FOSAMAX) 70 MG tablet Take 70 mg by mouth every Monday.   Jearl Klinefelter  ELLIPTA 62.5-25 MCG/INH AEPB Inhale 1 puff into the lungs daily.   atenolol (TENORMIN) 25 MG tablet Take 25 mg by mouth daily.   Cyanocobalamin (VITAMIN B-12 PO) Take 500 mcg by mouth daily.   diclofenac Sodium (VOLTAREN) 1 % GEL Apply 2 g topically 4 (four) times daily.   FEROSUL 325 (65 Fe) MG tablet Take 325 mg by mouth daily.   guaiFENesin (MUCINEX) 600 MG 12 hr tablet Take 600 mg by mouth 2 (two) times daily. 1 Tablet Twice Daily   levothyroxine (SYNTHROID, LEVOTHROID) 75 MCG tablet Take 75 mcg by mouth daily.   Magnesium 400 MG CAPS Take 400 mg by mouth daily.   metFORMIN (GLUCOPHAGE) 500 MG tablet Take 500 mg by mouth daily.   mirtazapine (REMERON) 15 MG tablet Take 15 mg by mouth at bedtime.   Multiple Vitamin (MULTIVITAMIN WITH MINERALS) TABS tablet Take 1 tablet by mouth daily.   OXYGEN Pt uses 2lpm with sleep and 4 lpm with exertion  DME- [facility]   PFIZER-BIONT COVID-19 VAC-TRIS SUSP injection    rosuvastatin (CRESTOR) 20 MG tablet Take 20 mg by mouth daily.   vitamin C (ASCORBIC ACID) 500 MG tablet Take 500 mg by mouth daily.   vitamin E 180 MG (400 UNITS) capsule Take 400 Units by mouth daily.                      Objective:   Physical Exam     07/26/2021      102 06/02/2020        105 05/09/2019          106  10/04/2018         107  03/26/2018       110  09/13/2017     111  04/25/2017      107 10/26/2016      105  04/13/2016       98   03/31/16 97 lb (43.999 kg)  02/08/16 94 lb (42.638 kg)  01/22/16 92 lb 3.2 oz (41.822 kg)     Vital signs reviewed  07/26/2021  - Note at rest 02 sats  95% on 2lpm    General appearance:    thin pleasant amb bf nad       HEENT: Edentulous    HEENT : pt wearing mask not removed for exam due to covid -19 concerns.    NECK :  without JVD/Nodes/TM/ nl carotid  upstrokes bilaterally   LUNGS: no acc muscle use,  Mod barrel  contour chest wall with bilateral  Distant bs s audible wheeze and  without cough on insp or exp maneuvers and mod  Hyperresonant  to  percussion bilaterally     CV:  RRR  no s3 or murmur or increase in P2, and no edema   ABD:  soft and nontender with pos mid insp Hoover's  in the supine position. No bruits or organomegaly appreciated, bowel sounds nl  MS:     ext warm without deformities, calf tenderness, cyanosis or clubbing No obvious joint restrictions   SKIN: warm and dry without lesions    NEURO:  alert, approp, nl sensorium with  no motor or cerebellar deficits apparent.               Assessment & Plan:

## 2021-07-26 NOTE — Assessment & Plan Note (Signed)
Quit smoking 2010 PFTs  12/2008 FEv1 39%, DLCO 35%, mild air trapping  - 04/25/2017  After extensive coaching HFA effectiveness =    75% (short Ti) - Referred 04/25/2017 rehab - 06/10/2017 insurance won't cover stiolto > changed to bevespi 2bid but preferred anoro  Pt is Group B in terms of symptom/risk and laba/lama therefore appropriate rx at this point >>>  Continue anoro 1 click each am

## 2021-08-03 ENCOUNTER — Other Ambulatory Visit: Payer: Self-pay | Admitting: Internal Medicine

## 2021-08-04 DIAGNOSIS — E039 Hypothyroidism, unspecified: Secondary | ICD-10-CM | POA: Diagnosis not present

## 2021-08-04 DIAGNOSIS — E785 Hyperlipidemia, unspecified: Secondary | ICD-10-CM | POA: Diagnosis not present

## 2021-08-04 DIAGNOSIS — I480 Paroxysmal atrial fibrillation: Secondary | ICD-10-CM | POA: Diagnosis not present

## 2021-08-04 DIAGNOSIS — G4700 Insomnia, unspecified: Secondary | ICD-10-CM | POA: Diagnosis not present

## 2021-08-04 DIAGNOSIS — I10 Essential (primary) hypertension: Secondary | ICD-10-CM | POA: Diagnosis not present

## 2021-08-04 DIAGNOSIS — E7800 Pure hypercholesterolemia, unspecified: Secondary | ICD-10-CM | POA: Diagnosis not present

## 2021-08-04 DIAGNOSIS — M810 Age-related osteoporosis without current pathological fracture: Secondary | ICD-10-CM | POA: Diagnosis not present

## 2021-08-04 DIAGNOSIS — G8929 Other chronic pain: Secondary | ICD-10-CM | POA: Diagnosis not present

## 2021-08-04 DIAGNOSIS — J449 Chronic obstructive pulmonary disease, unspecified: Secondary | ICD-10-CM | POA: Diagnosis not present

## 2021-08-04 DIAGNOSIS — J441 Chronic obstructive pulmonary disease with (acute) exacerbation: Secondary | ICD-10-CM | POA: Diagnosis not present

## 2021-08-10 DIAGNOSIS — J449 Chronic obstructive pulmonary disease, unspecified: Secondary | ICD-10-CM | POA: Diagnosis not present

## 2021-08-25 IMAGING — CT CT ABD-PELV W/ CM
4 of 5 series · 12 of 46 positions shown, 17 images · IV contrast (APPLIED)
Comparison: None.

CLINICAL DATA: Abdominal pain acute, nonlocalized.

EXAM:
CT ABDOMEN AND PELVIS WITH CONTRAST
TECHNIQUE: Multidetector CT imaging of the abdomen and pelvis was performed
using the standard protocol following bolus administration of
intravenous contrast.
CONTRAST:  100mL OMNIPAQUE IOHEXOL 300 MG/ML  SOLN

[Series 3: abdomen 5.0 · axial · 0.59mm/px · z∈[+902,+1157]mm · 6 of 73 slices shown, 11 images]
[im 11/73  soft-tissue]
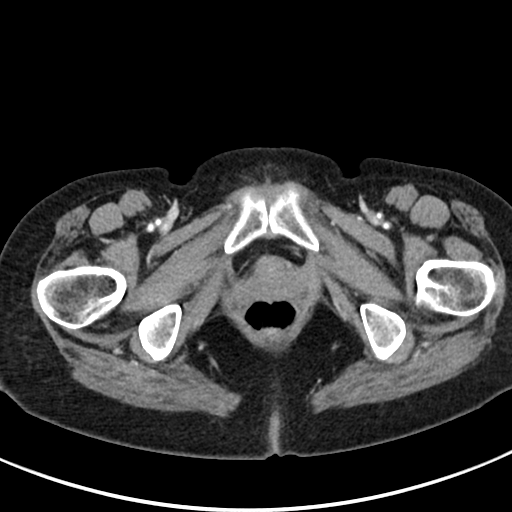
[im 11/73  bone]
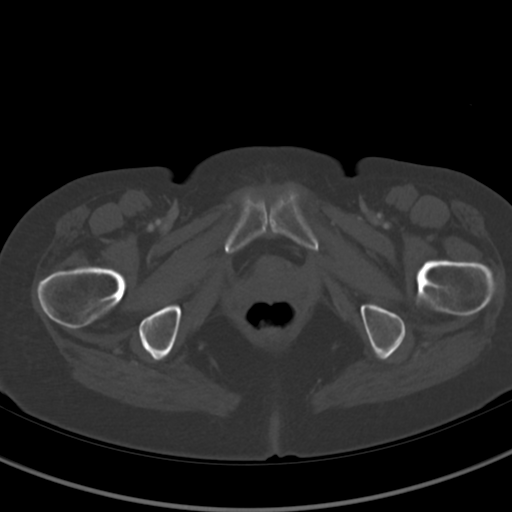
[im 21/73  soft-tissue]
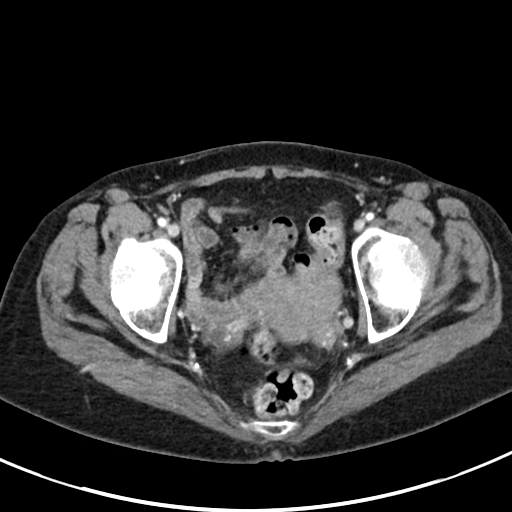
[im 31/73  soft-tissue]
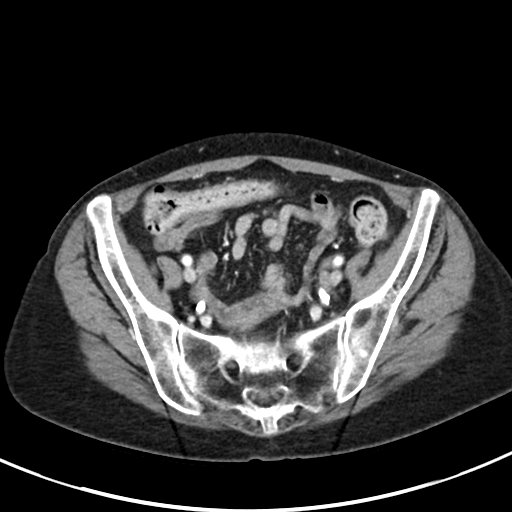
[im 31/73  lung]
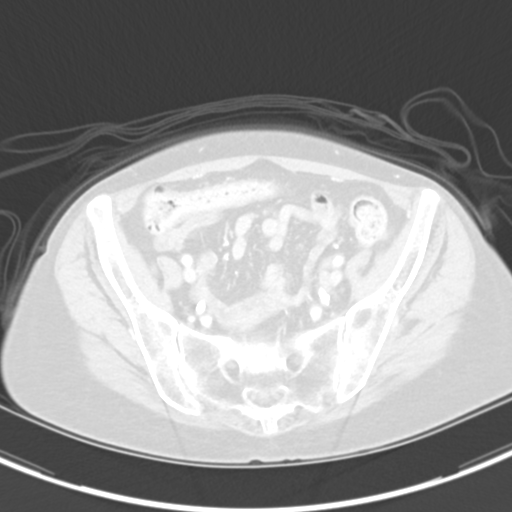
[im 42/73  soft-tissue]
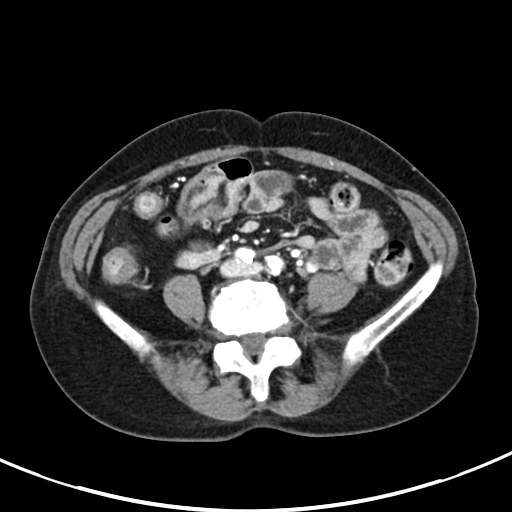
[im 42/73  lung]
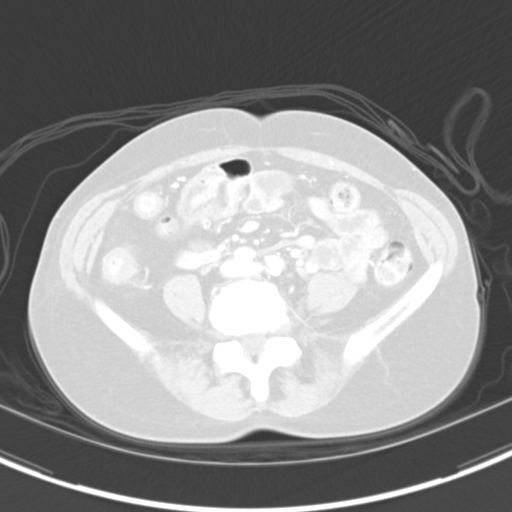
[im 52/73  soft-tissue]
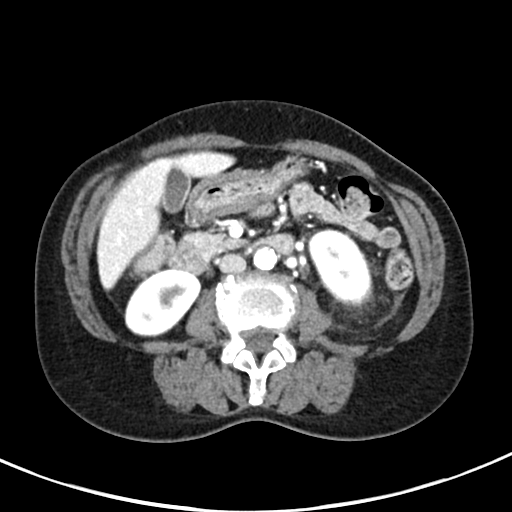
[im 52/73  lung]
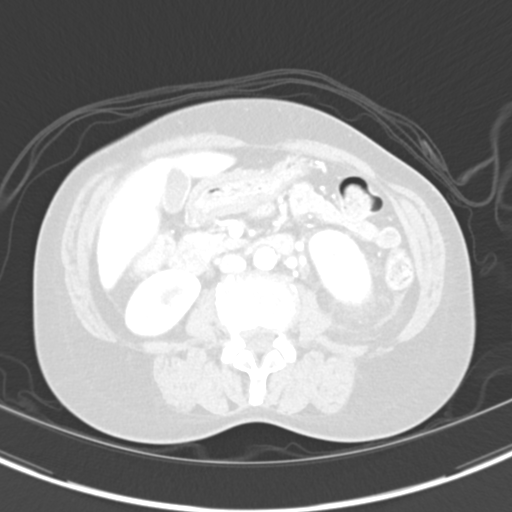
[im 62/73  soft-tissue]
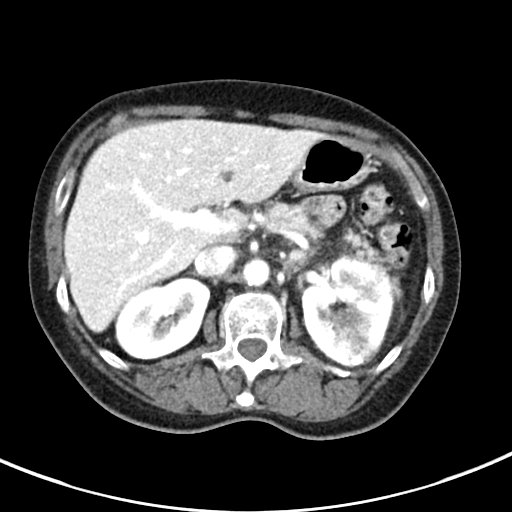
[im 62/73  lung]
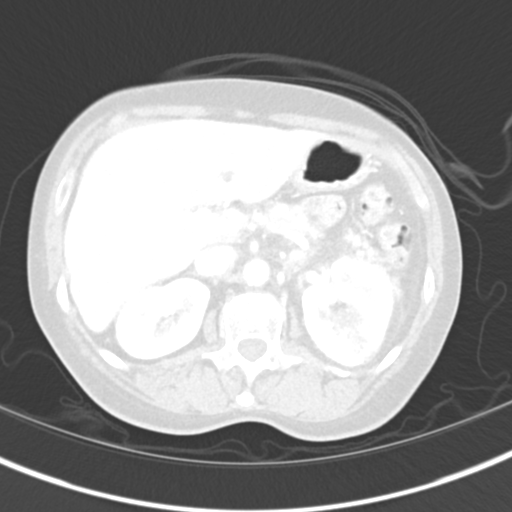

[Series 5: lung · axial · 0.59mm/px · z∈[+886,+922]mm · 2 of 182 slices shown]
[im 19/182  bone]
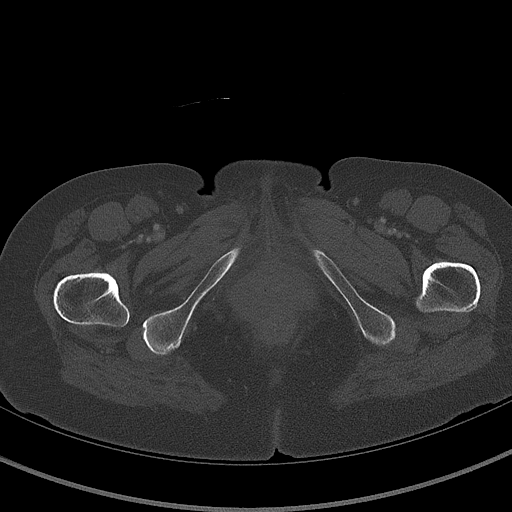
[im 37/182  bone]
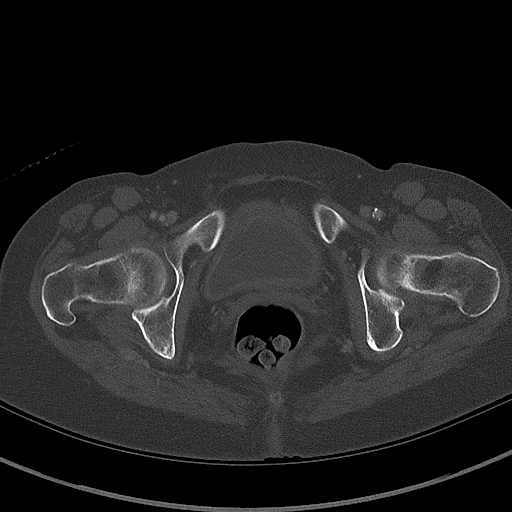

[Series 6: abdomen 3.0 mpr cor · coronal · 0.63mm/px · 3 of 73 slices shown]
[im 25/73  soft-tissue]
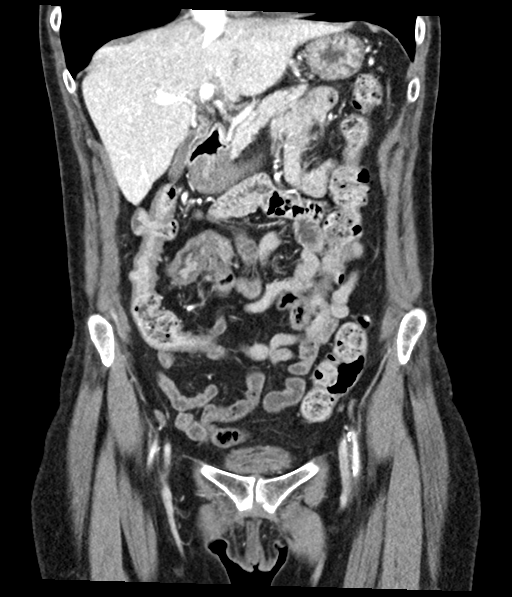
[im 33/73  soft-tissue]
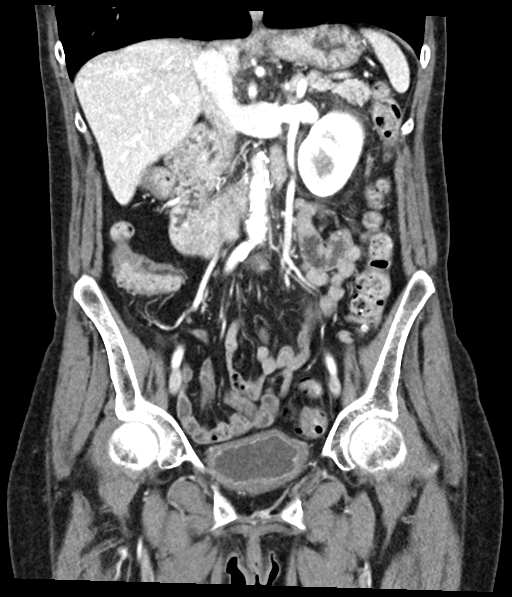
[im 41/73  soft-tissue]
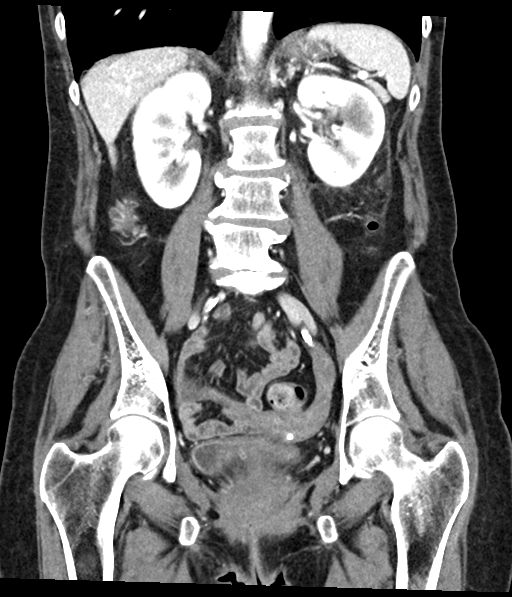

[Series 7: abdomen 3.0 mpr sag · sagittal · 0.42mm/px · 1 of 104 slices shown]
[im 35/104  soft-tissue]
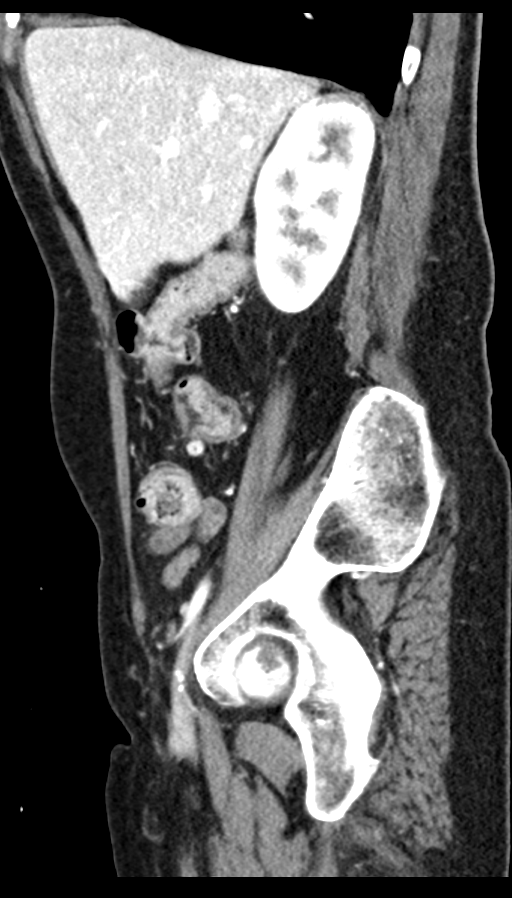

[12 of 46 positions shown; findings below may reference images not displayed]

FINDINGS: Lower chest: Suggestion of emphysematous changes.

Hepatobiliary: No focal liver abnormality. No gallstones,
gallbladder wall thickening, or pericholecystic fluid. No biliary
dilatation.

Pancreas: No focal lesion. Normal pancreatic contour. No surrounding
inflammatory changes. No main pancreatic ductal dilatation.

Spleen: Normal in size without focal abnormality.

Adrenals/Urinary Tract: No adrenal nodule bilaterally. Left striated
nephrogram. The right kidney enhances homogeneous Zeinab. No abscess
formation identified within the kidneys. Subcentimeter hypodensities
are too small to characterize. No hydronephrosis. No hydroureter. No
nephroureterolithiasis. Thickened urinary bladder wall and
hyperenhancing mucosa. On delayed imaging, there is no urothelial
wall thickening and there are no filling defects in the opacified
portions of the bilateral collecting systems or ureters.

Stomach/Bowel: Stomach is within normal limits. No evidence of bowel
wall thickening or dilatation. No pneumatosis. Scattered colonic
diverticulosis. Appendix appears normal.

Vascular/Lymphatic: No abdominal aorta or iliac aneurysm. Severe
calcified and noncalcified atherosclerotic plaque of the aorta and
its branches. No abdominal, pelvic, or inguinal lymphadenopathy.

Reproductive: Uterus and bilateral adnexa are unremarkable.

Other: No intraperitoneal free fluid. No intraperitoneal free gas.
No organized fluid collection.

Musculoskeletal:

No abdominal wall hernia or abnormality

No suspicious lytic or blastic osseous lesions. No acute displaced
fracture. Intervertebral disc space narrowing and vacuum phenomenon
as well as endplate sclerosis at the L4-L5 level.
IMPRESSION: 1. Left pyelonephritis and cystitis with no abscess formation.
2.  Aortic Atherosclerosis ([id number]).

## 2021-09-09 DIAGNOSIS — J449 Chronic obstructive pulmonary disease, unspecified: Secondary | ICD-10-CM | POA: Diagnosis not present

## 2021-09-20 ENCOUNTER — Other Ambulatory Visit: Payer: Self-pay | Admitting: Cardiovascular Disease

## 2021-09-20 NOTE — Telephone Encounter (Signed)
Prescription refill request for Eliquis received. Indication:Afib Last office visit:8/22 Scr:0.6 Age: 70 Weight:46.5 kg  Prescription refilled

## 2021-09-28 DIAGNOSIS — E039 Hypothyroidism, unspecified: Secondary | ICD-10-CM | POA: Diagnosis not present

## 2021-09-28 DIAGNOSIS — G8929 Other chronic pain: Secondary | ICD-10-CM | POA: Diagnosis not present

## 2021-09-28 DIAGNOSIS — I480 Paroxysmal atrial fibrillation: Secondary | ICD-10-CM | POA: Diagnosis not present

## 2021-09-28 DIAGNOSIS — M810 Age-related osteoporosis without current pathological fracture: Secondary | ICD-10-CM | POA: Diagnosis not present

## 2021-09-28 DIAGNOSIS — E7800 Pure hypercholesterolemia, unspecified: Secondary | ICD-10-CM | POA: Diagnosis not present

## 2021-09-28 DIAGNOSIS — J441 Chronic obstructive pulmonary disease with (acute) exacerbation: Secondary | ICD-10-CM | POA: Diagnosis not present

## 2021-09-28 DIAGNOSIS — I10 Essential (primary) hypertension: Secondary | ICD-10-CM | POA: Diagnosis not present

## 2021-09-28 DIAGNOSIS — G4700 Insomnia, unspecified: Secondary | ICD-10-CM | POA: Diagnosis not present

## 2021-10-10 DIAGNOSIS — J449 Chronic obstructive pulmonary disease, unspecified: Secondary | ICD-10-CM | POA: Diagnosis not present

## 2021-10-19 DIAGNOSIS — I700 Atherosclerosis of aorta: Secondary | ICD-10-CM | POA: Diagnosis not present

## 2021-10-19 DIAGNOSIS — Z7984 Long term (current) use of oral hypoglycemic drugs: Secondary | ICD-10-CM | POA: Diagnosis not present

## 2021-10-19 DIAGNOSIS — E1169 Type 2 diabetes mellitus with other specified complication: Secondary | ICD-10-CM | POA: Diagnosis not present

## 2021-10-19 DIAGNOSIS — I10 Essential (primary) hypertension: Secondary | ICD-10-CM | POA: Diagnosis not present

## 2021-10-19 DIAGNOSIS — J449 Chronic obstructive pulmonary disease, unspecified: Secondary | ICD-10-CM | POA: Diagnosis not present

## 2021-10-19 DIAGNOSIS — E7800 Pure hypercholesterolemia, unspecified: Secondary | ICD-10-CM | POA: Diagnosis not present

## 2021-10-19 DIAGNOSIS — I4892 Unspecified atrial flutter: Secondary | ICD-10-CM | POA: Diagnosis not present

## 2021-10-19 DIAGNOSIS — E039 Hypothyroidism, unspecified: Secondary | ICD-10-CM | POA: Diagnosis not present

## 2021-10-19 DIAGNOSIS — M810 Age-related osteoporosis without current pathological fracture: Secondary | ICD-10-CM | POA: Diagnosis not present

## 2021-11-10 DIAGNOSIS — J449 Chronic obstructive pulmonary disease, unspecified: Secondary | ICD-10-CM | POA: Diagnosis not present

## 2021-12-08 DIAGNOSIS — J449 Chronic obstructive pulmonary disease, unspecified: Secondary | ICD-10-CM | POA: Diagnosis not present

## 2021-12-31 DIAGNOSIS — E039 Hypothyroidism, unspecified: Secondary | ICD-10-CM | POA: Diagnosis not present

## 2021-12-31 DIAGNOSIS — E785 Hyperlipidemia, unspecified: Secondary | ICD-10-CM | POA: Diagnosis not present

## 2021-12-31 DIAGNOSIS — I10 Essential (primary) hypertension: Secondary | ICD-10-CM | POA: Diagnosis not present

## 2021-12-31 DIAGNOSIS — G8929 Other chronic pain: Secondary | ICD-10-CM | POA: Diagnosis not present

## 2021-12-31 DIAGNOSIS — E1169 Type 2 diabetes mellitus with other specified complication: Secondary | ICD-10-CM | POA: Diagnosis not present

## 2022-01-20 ENCOUNTER — Other Ambulatory Visit: Payer: Self-pay | Admitting: Internal Medicine

## 2022-02-01 IMAGING — CR DG LUMBAR SPINE 2-3V
2 series · 2 of 2 positions shown · non-contrast
Comparison: 11/03/2020 CT

CLINICAL DATA: Low back pain

EXAM:
LUMBAR SPINE - 2-3 VIEW

[w l-spine a.p. *]
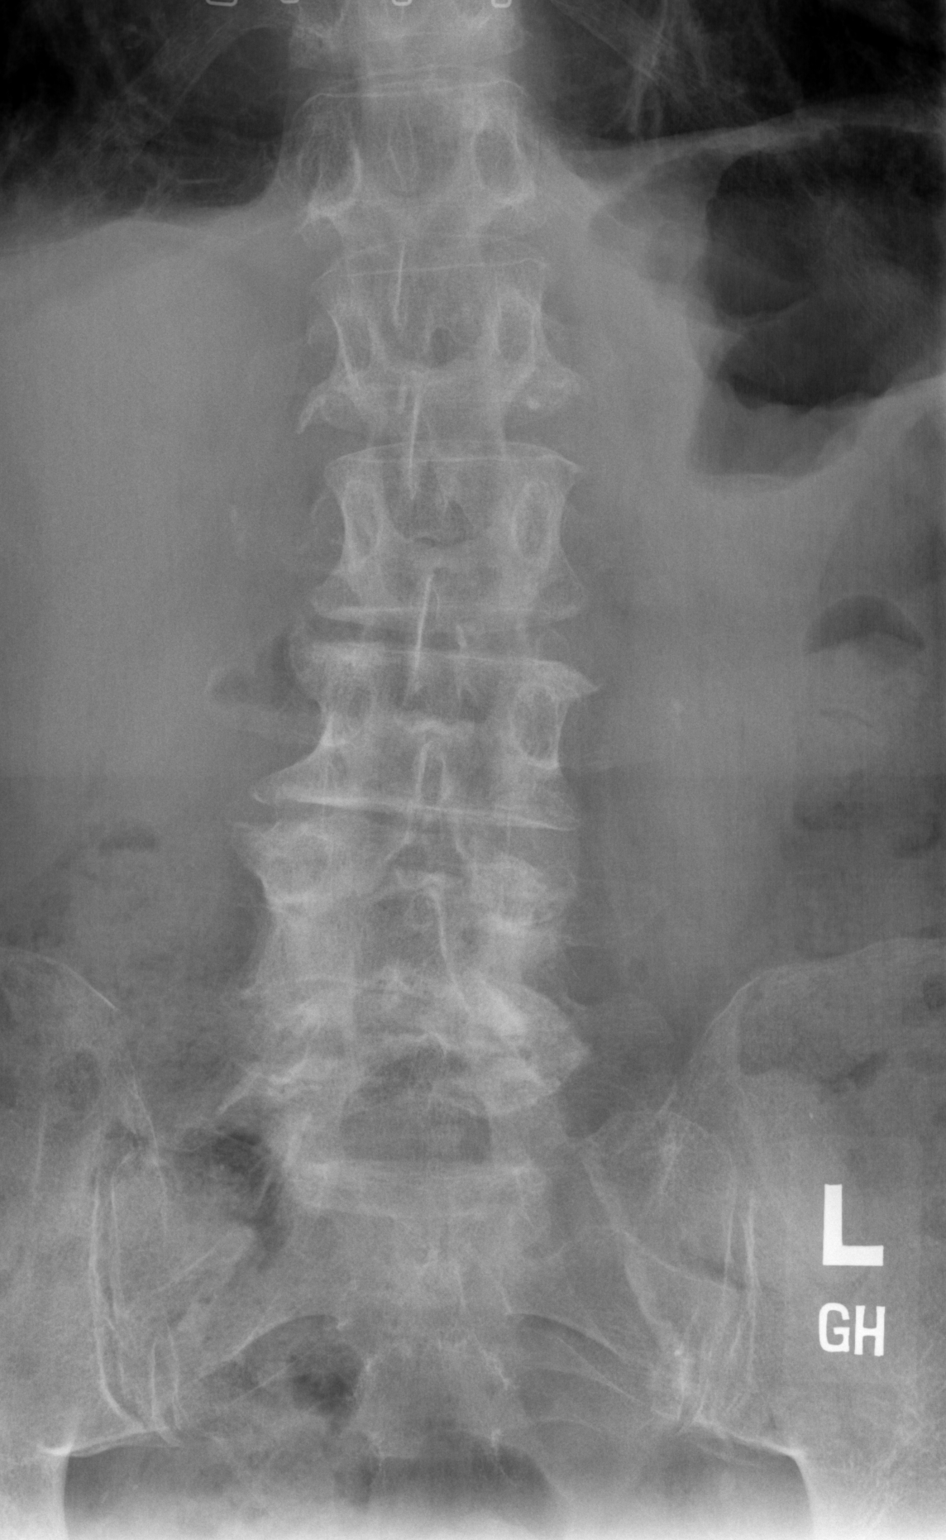

[w l-spine lat *]
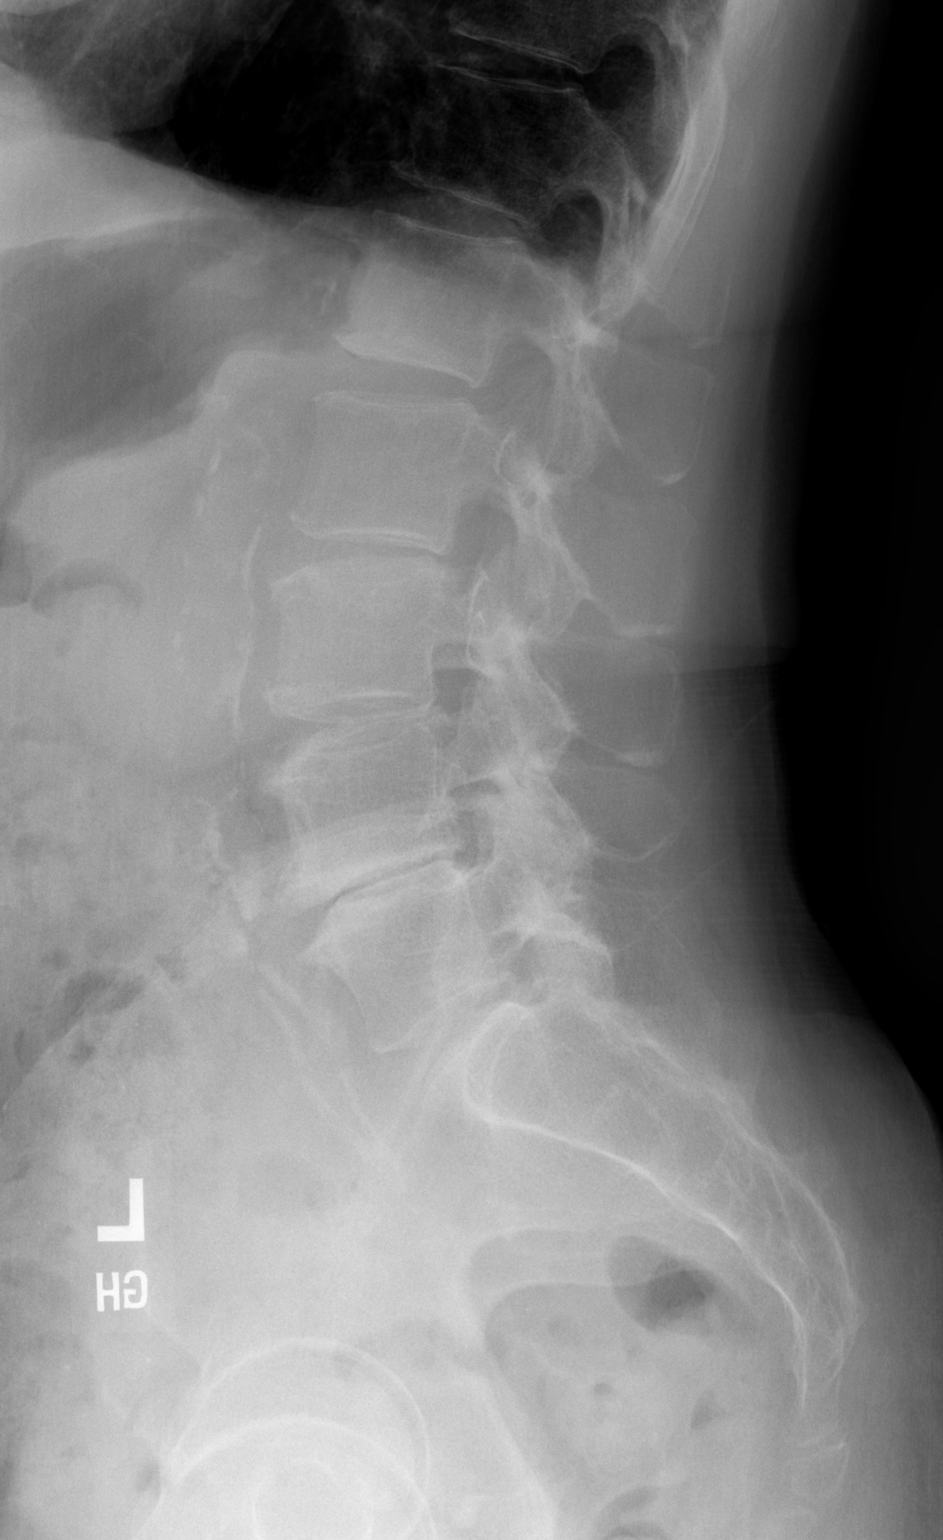

[2 of 2 positions shown; findings below may reference images not displayed]

FINDINGS: Five lumbar type vertebral bodies are well visualized. Vertebral
body height is well maintained. Disc space narrowing is noted worst
at L4-5 with associated osteophytic changes. Facet hypertrophic
changes are noted without anterolisthesis. Aortic calcifications are
seen. No soft tissue abnormality is noted.
IMPRESSION: Degenerative change without acute abnormality.

## 2022-02-07 DIAGNOSIS — J449 Chronic obstructive pulmonary disease, unspecified: Secondary | ICD-10-CM | POA: Diagnosis not present

## 2022-03-10 DIAGNOSIS — J449 Chronic obstructive pulmonary disease, unspecified: Secondary | ICD-10-CM | POA: Diagnosis not present

## 2022-03-21 ENCOUNTER — Other Ambulatory Visit: Payer: Self-pay | Admitting: Cardiovascular Disease

## 2022-03-21 NOTE — Telephone Encounter (Signed)
Prescription refill request for Eliquis received. Indication:Afib Last office visit:8/22 Scr:0.5 Age: 71 Weight:46.5 kg  Prescription refilled

## 2022-03-30 ENCOUNTER — Telehealth: Payer: Self-pay | Admitting: Internal Medicine

## 2022-03-30 DIAGNOSIS — M810 Age-related osteoporosis without current pathological fracture: Secondary | ICD-10-CM | POA: Diagnosis not present

## 2022-03-30 DIAGNOSIS — G8929 Other chronic pain: Secondary | ICD-10-CM | POA: Diagnosis not present

## 2022-03-30 DIAGNOSIS — I10 Essential (primary) hypertension: Secondary | ICD-10-CM | POA: Diagnosis not present

## 2022-03-30 DIAGNOSIS — J449 Chronic obstructive pulmonary disease, unspecified: Secondary | ICD-10-CM | POA: Diagnosis not present

## 2022-03-30 DIAGNOSIS — E1169 Type 2 diabetes mellitus with other specified complication: Secondary | ICD-10-CM | POA: Diagnosis not present

## 2022-03-30 DIAGNOSIS — I480 Paroxysmal atrial fibrillation: Secondary | ICD-10-CM | POA: Diagnosis not present

## 2022-03-30 DIAGNOSIS — G4700 Insomnia, unspecified: Secondary | ICD-10-CM | POA: Diagnosis not present

## 2022-03-30 DIAGNOSIS — E7800 Pure hypercholesterolemia, unspecified: Secondary | ICD-10-CM | POA: Diagnosis not present

## 2022-03-30 DIAGNOSIS — E039 Hypothyroidism, unspecified: Secondary | ICD-10-CM | POA: Diagnosis not present

## 2022-03-31 MED ORDER — ALBUTEROL SULFATE HFA 108 (90 BASE) MCG/ACT IN AERS: 1.0000 | INHALATION_SPRAY | Freq: Four times a day (QID) | RESPIRATORY_TRACT | 4 refills | Status: DC | PRN

## 2022-03-31 NOTE — Telephone Encounter (Signed)
Called patient to verify that she needed refills on her albuterol. And verified pharmacy with patient. Nothing further needed

## 2022-04-09 DIAGNOSIS — J449 Chronic obstructive pulmonary disease, unspecified: Secondary | ICD-10-CM | POA: Diagnosis not present

## 2022-04-19 ENCOUNTER — Other Ambulatory Visit: Payer: Self-pay | Admitting: Internal Medicine

## 2022-04-19 ENCOUNTER — Ambulatory Visit
Admission: RE | Admit: 2022-04-19 | Discharge: 2022-04-19 | Disposition: A | Discharge status: Home / Self Care | Payer: Medicare Other | Source: Ambulatory Visit | Attending: Internal Medicine | Admitting: Internal Medicine

## 2022-04-19 DIAGNOSIS — M545 Low back pain, unspecified: Secondary | ICD-10-CM | POA: Diagnosis not present

## 2022-04-19 DIAGNOSIS — E039 Hypothyroidism, unspecified: Secondary | ICD-10-CM | POA: Diagnosis not present

## 2022-04-19 DIAGNOSIS — I480 Paroxysmal atrial fibrillation: Secondary | ICD-10-CM | POA: Diagnosis not present

## 2022-04-19 DIAGNOSIS — I10 Essential (primary) hypertension: Secondary | ICD-10-CM | POA: Diagnosis not present

## 2022-04-19 DIAGNOSIS — M5136 Other intervertebral disc degeneration, lumbar region: Secondary | ICD-10-CM | POA: Diagnosis not present

## 2022-04-19 DIAGNOSIS — Z0000 Encounter for general adult medical examination without abnormal findings: Secondary | ICD-10-CM | POA: Diagnosis not present

## 2022-04-19 DIAGNOSIS — J449 Chronic obstructive pulmonary disease, unspecified: Secondary | ICD-10-CM | POA: Diagnosis not present

## 2022-04-19 DIAGNOSIS — E1169 Type 2 diabetes mellitus with other specified complication: Secondary | ICD-10-CM | POA: Diagnosis not present

## 2022-04-19 DIAGNOSIS — I700 Atherosclerosis of aorta: Secondary | ICD-10-CM | POA: Diagnosis not present

## 2022-04-19 DIAGNOSIS — E7800 Pure hypercholesterolemia, unspecified: Secondary | ICD-10-CM | POA: Diagnosis not present

## 2022-04-19 DIAGNOSIS — M810 Age-related osteoporosis without current pathological fracture: Secondary | ICD-10-CM | POA: Diagnosis not present

## 2022-04-19 DIAGNOSIS — D6869 Other thrombophilia: Secondary | ICD-10-CM | POA: Diagnosis not present

## 2022-04-20 ENCOUNTER — Other Ambulatory Visit: Payer: Self-pay | Admitting: Internal Medicine

## 2022-04-20 DIAGNOSIS — M810 Age-related osteoporosis without current pathological fracture: Secondary | ICD-10-CM

## 2022-04-20 DIAGNOSIS — Z1231 Encounter for screening mammogram for malignant neoplasm of breast: Secondary | ICD-10-CM

## 2022-04-26 DIAGNOSIS — Z1211 Encounter for screening for malignant neoplasm of colon: Secondary | ICD-10-CM | POA: Diagnosis not present

## 2022-05-10 DIAGNOSIS — J449 Chronic obstructive pulmonary disease, unspecified: Secondary | ICD-10-CM | POA: Diagnosis not present

## 2022-05-19 ENCOUNTER — Other Ambulatory Visit: Payer: Medicare Other

## 2022-06-05 ENCOUNTER — Other Ambulatory Visit: Payer: Self-pay | Admitting: Internal Medicine

## 2022-06-09 ENCOUNTER — Ambulatory Visit
Admission: RE | Admit: 2022-06-09 | Discharge: 2022-06-09 | Disposition: A | Discharge status: Home / Self Care | Payer: Medicare Other | Source: Ambulatory Visit | Attending: Internal Medicine | Admitting: Internal Medicine

## 2022-06-09 DIAGNOSIS — Z780 Asymptomatic menopausal state: Secondary | ICD-10-CM | POA: Diagnosis not present

## 2022-06-09 DIAGNOSIS — Z1231 Encounter for screening mammogram for malignant neoplasm of breast: Secondary | ICD-10-CM

## 2022-06-09 DIAGNOSIS — M810 Age-related osteoporosis without current pathological fracture: Secondary | ICD-10-CM | POA: Diagnosis not present

## 2022-06-10 DIAGNOSIS — J449 Chronic obstructive pulmonary disease, unspecified: Secondary | ICD-10-CM | POA: Diagnosis not present

## 2022-06-20 ENCOUNTER — Other Ambulatory Visit: Payer: Self-pay | Admitting: Internal Medicine

## 2022-06-21 DIAGNOSIS — H2511 Age-related nuclear cataract, right eye: Secondary | ICD-10-CM | POA: Diagnosis not present

## 2022-06-21 DIAGNOSIS — H2513 Age-related nuclear cataract, bilateral: Secondary | ICD-10-CM | POA: Diagnosis not present

## 2022-06-21 DIAGNOSIS — H25013 Cortical age-related cataract, bilateral: Secondary | ICD-10-CM | POA: Diagnosis not present

## 2022-06-21 DIAGNOSIS — E119 Type 2 diabetes mellitus without complications: Secondary | ICD-10-CM | POA: Diagnosis not present

## 2022-06-21 DIAGNOSIS — H40013 Open angle with borderline findings, low risk, bilateral: Secondary | ICD-10-CM | POA: Diagnosis not present

## 2022-06-24 DIAGNOSIS — E1169 Type 2 diabetes mellitus with other specified complication: Secondary | ICD-10-CM | POA: Diagnosis not present

## 2022-06-24 DIAGNOSIS — E039 Hypothyroidism, unspecified: Secondary | ICD-10-CM | POA: Diagnosis not present

## 2022-06-24 DIAGNOSIS — I480 Paroxysmal atrial fibrillation: Secondary | ICD-10-CM | POA: Diagnosis not present

## 2022-06-24 DIAGNOSIS — I10 Essential (primary) hypertension: Secondary | ICD-10-CM | POA: Diagnosis not present

## 2022-06-24 DIAGNOSIS — E785 Hyperlipidemia, unspecified: Secondary | ICD-10-CM | POA: Diagnosis not present

## 2022-06-24 DIAGNOSIS — M810 Age-related osteoporosis without current pathological fracture: Secondary | ICD-10-CM | POA: Diagnosis not present

## 2022-06-24 DIAGNOSIS — G4700 Insomnia, unspecified: Secondary | ICD-10-CM | POA: Diagnosis not present

## 2022-06-24 DIAGNOSIS — J449 Chronic obstructive pulmonary disease, unspecified: Secondary | ICD-10-CM | POA: Diagnosis not present

## 2022-07-10 DIAGNOSIS — J449 Chronic obstructive pulmonary disease, unspecified: Secondary | ICD-10-CM | POA: Diagnosis not present

## 2022-07-26 ENCOUNTER — Ambulatory Visit: Payer: Medicare HMO | Admitting: Internal Medicine

## 2022-07-31 NOTE — Progress Notes (Unsigned)
Subjective:   [name], female    DOB: 11-13-1950   MRN: [id number]      Brief patient profile:  86   yobf  Quit smoking 2010  with  GOLD III criteria copd / 0 2 dep     Significant tests/ events  She was seen by Dr [name] 12/24/08 after hospitalization for LUL pna. CT angio 3/10 showed centrilobular emphysema.She was placed on oxygen around this time [facility] pt) .  PFTs  12/2008 FEv1 39%, DLCO 35%, mild air trapping    History of Present Illness  08/28/2015 acute extended ov/[name] re: GOLD III copd/ on spiriva and freq saba and 02 2lpm hs/ 3lpm with activity  Chief Complaint  Patient presents with   Acute Visit    Pt c/o increased SOB for the past few wks- "I have attacks"- relates to the weather change. She takes albuterol inhaler 2 x per day on average and uses neb 1-2 x per day.   baseline doe = MMRC2 = can't walk a nl pace on a flat grade s sob  >>Stiolto rx       03/31/2016 acute extended ov/[name] re: aecopd on anoro req by insurance   2lpm rest/3lpm  Chief Complaint  Patient presents with   Acute Visit    Pt c/o increased DOE for the past 1-2 wks. She has been using neb at least once per day.  She also c/o facial puffiness and HA "I have had a head cold". She has had minimal cough with white sputum.     easily confused with details of care/ overusing neb and not using saba hfa   Not clear what the alternatives to anoro are. Doe = MMRC3 = can't walk 100 yards even at a slow pace at a flat grade s stopping due to sob  On 3lpm and uses w/c usually to go out.  rec Plan A = Automatic = Stiolto 2 each am  Plan B = Backup Only use your albuterol as a rescue medication  Plan C = Crisis - only use your albuterol nebulizer if you first try Plan B and it fails to help > ok to use the nebulizer up to every 4 hours but if start needing it regularly call for immediate appointment Plan D = Doctor - call me if B and C not adequate Plan E = ER - go to ER or  call 911 if all else fails   Prednisone 10 mg take  4 each am x 2 days,   2 each am x 2 days,  1 each am x 2 days and stop     06/02/2020  f/u ov/[name] re:  GOLD III  Chief Complaint  Patient presents with   Follow-up    Pt doing well. Denies any problems   Dyspnea:  No longer walking mb / just in house - walks [employer] pushing on buggy  Cough: no  Sleeping: one pillow bed is flat  SABA use: none  02: 2.5lpm and 4 portable pulsed  Rec Make sure you check your oxygen saturations at highest level of activity to be sure it stays over 90%   For now you need 3lpm from your present machine or 3lpm from Portable concentrator when you walk but nothing needed sitting at rest so save your 02 for when your saturation level is less than 90%       08/01/2022  f/u ov/[name] re: yearly f/u GOLD 3  copd maint  on anoro   No chief complaint on file. Dyspnea:  has stationery bikes 30 min s stopping and sats 95% on 2lpm  Cough: no  Sleeping: bed is flat  SABA use: none  02: sleeping 2lpm / does not titrate Covid status:   vax x 2  Lung cancer screening :  referred 08/01/2022     No obvious day to day or daytime variability or assoc excess/ purulent sputum or mucus plugs or hemoptysis or cp or chest tightness, subjective wheeze or overt sinus or hb symptoms.   Sleeping  without nocturnal  or early am exacerbation  of respiratory  c/o's or need for noct saba. Also denies any obvious fluctuation of symptoms with weather or environmental changes or other aggravating or alleviating factors except as outlined above   No unusual exposure hx or h/o childhood pna/ asthma or knowledge of premature birth.  Current Allergies, Complete Past Medical History, Past Surgical History, Family History, and Social History were reviewed in [employer] record.  ROS  The following are not active complaints unless bolded Hoarseness, sore throat, dysphagia, dental problems, itching, sneezing,  nasal  congestion or discharge of excess mucus or purulent secretions, ear ache,   fever, chills, sweats, unintended wt loss or wt gain, classically pleuritic or exertional cp,  orthopnea pnd or arm/hand swelling  or leg swelling, presyncope, palpitations, abdominal pain, anorexia, nausea, vomiting, diarrhea  or change in bowel habits or change in bladder habits, change in stools or change in urine, dysuria, hematuria,  rash, arthralgias, visual complaints, headache, numbness, weakness or ataxia or problems with walking or coordination,  change in mood or  memory.        Current Meds  Medication Sig   albuterol (PROVENTIL) (2.5 MG/3ML) 0.083% nebulizer solution Take 3 mLs (2.5 mg total) by nebulization every 2 (two) hours as needed for wheezing or shortness of breath.   albuterol (VENTOLIN HFA) 108 (90 Base) MCG/ACT inhaler Inhale 1-2 puffs into the lungs every 6 (six) hours as needed for wheezing or shortness of breath.   alendronate (FOSAMAX) 70 MG tablet Take 70 mg by mouth every Monday.   ANORO ELLIPTA 62.5-25 MCG/ACT AEPB INHALE 1 PUFF BY MOUTH INTO LUNGS ONCE DAILY   atenolol (TENORMIN) 25 MG tablet Take 25 mg by mouth daily.   Cyanocobalamin (VITAMIN B-12 PO) Take 500 mcg by mouth daily.   diclofenac Sodium (VOLTAREN) 1 % GEL Apply 2 g topically 4 (four) times daily.   diltiazem (CARDIZEM CD) 120 MG 24 hr capsule TAKE ONE CAPSULE BY MOUTH ONCE DAILY   ELIQUIS 5 MG TABS tablet TAKE ONE TABLET BY MOUTH TWICE DAILY   FEROSUL 325 (65 Fe) MG tablet Take 325 mg by mouth daily.   guaiFENesin (MUCINEX) 600 MG 12 hr tablet Take 600 mg by mouth 2 (two) times daily. 1 Tablet Twice Daily   levothyroxine (SYNTHROID, LEVOTHROID) 75 MCG tablet Take 75 mcg by mouth daily.   Magnesium 400 MG CAPS Take 400 mg by mouth daily.   metFORMIN (GLUCOPHAGE) 500 MG tablet Take 500 mg by mouth daily.   mirtazapine (REMERON) 15 MG tablet Take 15 mg by mouth at bedtime.   Multiple Vitamin (MULTIVITAMIN WITH MINERALS) TABS  tablet Take 1 tablet by mouth daily.   OXYGEN Pt uses 2lpm with sleep and 4 lpm with exertion  DME- [facility]   PFIZER-BIONT COVID-19 VAC-TRIS SUSP injection    rosuvastatin (CRESTOR) 20 MG tablet Take 20 mg by mouth daily.  vitamin C (ASCORBIC ACID) 500 MG tablet Take 500 mg by mouth daily.   vitamin E 180 MG (400 UNITS) capsule Take 400 Units by mouth daily.                       Objective:   Physical Exam   wts   08/01/2022       97 07/26/2021      102 06/02/2020        105 05/09/2019          106  10/04/2018         107  03/26/2018       110  09/13/2017     111  04/25/2017      107 10/26/2016      105  04/13/2016       98   03/31/16 97 lb (43.999 kg)  02/08/16 94 lb (42.638 kg)  01/22/16 92 lb 3.2 oz (41.822 kg)    Vital signs reviewed  08/01/2022  - Note at rest 02 sats  95% on RA   General appearance:    amb bf nad      HEENT :  Oropharynx  clear/ edentulous   Nasal turbinates nl    NECK :  without JVD/Nodes/TM/ nl carotid upstrokes bilaterally   LUNGS: no acc muscle use,  Mod barrel  contour chest wall with bilateral  Distant bs s audible wheeze and  without cough on insp or exp maneuvers and mod  Hyperresonant  to  percussion bilaterally     CV:  RRR  no s3 or murmur or increase in P2, and no edema   ABD:  soft and nontender with pos mid insp Hoover's  in the supine position. No bruits or organomegaly appreciated, bowel sounds nl  MS:   Ext warm without deformities or   obvious joint restrictions , calf tenderness, cyanosis or clubbing  SKIN: warm and dry without lesions    NEURO:  alert, approp, nl sensorium with  no motor or cerebellar deficits apparent.                        Assessment & Plan:

## 2022-08-01 ENCOUNTER — Encounter: Payer: Self-pay | Admitting: Internal Medicine

## 2022-08-01 ENCOUNTER — Ambulatory Visit (INDEPENDENT_AMBULATORY_CARE_PROVIDER_SITE_OTHER): Payer: Medicare Other | Admitting: Internal Medicine

## 2022-08-01 DIAGNOSIS — J449 Chronic obstructive pulmonary disease, unspecified: Secondary | ICD-10-CM | POA: Diagnosis not present

## 2022-08-01 DIAGNOSIS — J9611 Chronic respiratory failure with hypoxia: Secondary | ICD-10-CM | POA: Diagnosis not present

## 2022-08-01 DIAGNOSIS — Z87891 Personal history of nicotine dependence: Secondary | ICD-10-CM | POA: Diagnosis not present

## 2022-08-01 NOTE — Assessment & Plan Note (Addendum)
rx  2 lpm  - HCO3 09/12/2019 =  34  - HC03   04/10/20        =  38  - POC qualification  06/02/2020  Ok on RA at rest, drops on 250 ft to 88 corrected to 93% on POC  - HC02  11/13/20   = 37    Advised: Make sure you check your oxygen saturation  AT  your highest level of activity (not after you stop)   to be sure it stays over 90% and adjust  02 flow upward to maintain this level if needed but remember to turn it back to previous settings when you stop (to conserve your supply).   F/u q 12 m, sooner prn         Each maintenance medication was reviewed in detail including emphasizing most importantly the difference between maintenance and prns and under what circumstances the prns are to be triggered using an action plan format where appropriate.  Total time for H and P, chart review, counseling, reviewing smi/hfa/02/neb  device(s) and generating customized AVS unique to this office visit / same day charting = 22 min

## 2022-08-01 NOTE — Assessment & Plan Note (Signed)
Quit smoking 2010 PFTs  12/2008 FEv1 39%, DLCO 35%, mild air trapping  - 04/25/2017  After extensive coaching HFA effectiveness =    75% (short Ti) - Referred 04/25/2017 rehab - 06/10/2017 insurance won't cover stiolto > changed to bevespi 2bid but preferred anoro  Pt is Group B in terms of symptom/risk and laba/lama therefore appropriate rx at this point >>>  Continue anoro and prn saba as well as adequate 02  (see separate a/p)

## 2022-08-01 NOTE — Assessment & Plan Note (Signed)
Low-dose CT lung cancer screening is recommended for patients who are 58-73 years of age with a 20+ pack-year history of smoking and who are currently smoking or quit <=15 years ago. No coughing up blood  No unintentional weight loss of > 15 pounds in the last 6 months - pt is eligible for scanning yearly until 2026 > referred

## 2022-08-01 NOTE — Patient Instructions (Signed)
My office will be contacting you by phone for referral to lung cancer screening   - if you don't hear back from my office within one week please call us back or notify us thru MyChart and we'll address it right away.   No change in medications  Make sure you check your oxygen saturation  AT  your highest level of activity (not after you stop)   to be sure it stays over 90% and adjust  02 flow upward to maintain this level if needed but remember to turn it back to previous settings when you stop (to conserve your supply).   Please schedule a follow up visit in 12  months but call sooner if needed

## 2022-08-05 ENCOUNTER — Other Ambulatory Visit: Payer: Self-pay | Admitting: Internal Medicine

## 2022-08-10 DIAGNOSIS — J449 Chronic obstructive pulmonary disease, unspecified: Secondary | ICD-10-CM | POA: Diagnosis not present

## 2022-08-15 DIAGNOSIS — H2511 Age-related nuclear cataract, right eye: Secondary | ICD-10-CM | POA: Diagnosis not present

## 2022-08-16 DIAGNOSIS — H2512 Age-related nuclear cataract, left eye: Secondary | ICD-10-CM | POA: Diagnosis not present

## 2022-09-01 ENCOUNTER — Other Ambulatory Visit: Payer: Self-pay | Admitting: Internal Medicine

## 2022-09-09 DIAGNOSIS — J449 Chronic obstructive pulmonary disease, unspecified: Secondary | ICD-10-CM | POA: Diagnosis not present

## 2022-09-12 DIAGNOSIS — H2512 Age-related nuclear cataract, left eye: Secondary | ICD-10-CM | POA: Diagnosis not present

## 2022-09-18 ENCOUNTER — Other Ambulatory Visit: Payer: Self-pay | Admitting: Cardiovascular Disease

## 2022-09-19 ENCOUNTER — Other Ambulatory Visit: Payer: Self-pay | Admitting: Cardiovascular Disease

## 2022-09-19 NOTE — Telephone Encounter (Signed)
Prescription refill request for Eliquis received. Indication:afib Last office visit:needs appt Scr:0.6 Age: 71 Weight:44.2  kg  Prescription refilled

## 2022-09-21 ENCOUNTER — Other Ambulatory Visit: Payer: Self-pay | Admitting: Internal Medicine

## 2022-09-29 ENCOUNTER — Other Ambulatory Visit: Payer: Self-pay | Admitting: Internal Medicine

## 2022-10-10 DIAGNOSIS — J449 Chronic obstructive pulmonary disease, unspecified: Secondary | ICD-10-CM | POA: Diagnosis not present

## 2022-10-19 DIAGNOSIS — I10 Essential (primary) hypertension: Secondary | ICD-10-CM | POA: Diagnosis not present

## 2022-10-19 DIAGNOSIS — E1169 Type 2 diabetes mellitus with other specified complication: Secondary | ICD-10-CM | POA: Diagnosis not present

## 2022-10-19 DIAGNOSIS — E7800 Pure hypercholesterolemia, unspecified: Secondary | ICD-10-CM | POA: Diagnosis not present

## 2022-10-19 DIAGNOSIS — J449 Chronic obstructive pulmonary disease, unspecified: Secondary | ICD-10-CM | POA: Diagnosis not present

## 2022-10-19 DIAGNOSIS — D6869 Other thrombophilia: Secondary | ICD-10-CM | POA: Diagnosis not present

## 2022-10-19 DIAGNOSIS — I700 Atherosclerosis of aorta: Secondary | ICD-10-CM | POA: Diagnosis not present

## 2022-10-19 DIAGNOSIS — M810 Age-related osteoporosis without current pathological fracture: Secondary | ICD-10-CM | POA: Diagnosis not present

## 2022-10-19 DIAGNOSIS — E039 Hypothyroidism, unspecified: Secondary | ICD-10-CM | POA: Diagnosis not present

## 2022-10-19 DIAGNOSIS — I4892 Unspecified atrial flutter: Secondary | ICD-10-CM | POA: Diagnosis not present

## 2022-10-19 DIAGNOSIS — D649 Anemia, unspecified: Secondary | ICD-10-CM | POA: Diagnosis not present

## 2022-10-27 ENCOUNTER — Other Ambulatory Visit: Payer: Self-pay | Admitting: Internal Medicine

## 2022-10-27 ENCOUNTER — Other Ambulatory Visit: Payer: Self-pay | Admitting: Cardiovascular Disease

## 2022-10-27 NOTE — Telephone Encounter (Signed)
Prescription refill request for Eliquis received. Indication: PAF Last office visit: 05/21/21  Jerilynn Mages Croitoru MD Scr: 0.57 on 10/19/22 Age: 72 Weight: 46.4kg  Based on above findings Eliquis '5mg'$  twice daily is the appropriate dose.  Pt is past due for appt with Dr Sallyanne Kuster.  Message sent to schedulers and refill approved x 1.

## 2022-11-10 DIAGNOSIS — J449 Chronic obstructive pulmonary disease, unspecified: Secondary | ICD-10-CM | POA: Diagnosis not present

## 2022-11-24 ENCOUNTER — Other Ambulatory Visit: Payer: Self-pay | Admitting: Internal Medicine

## 2022-12-09 DIAGNOSIS — J449 Chronic obstructive pulmonary disease, unspecified: Secondary | ICD-10-CM | POA: Diagnosis not present

## 2022-12-19 ENCOUNTER — Other Ambulatory Visit: Payer: Self-pay | Admitting: Cardiovascular Disease

## 2022-12-19 NOTE — Telephone Encounter (Signed)
Pt last saw Dr Sallyanne Kuster 05/21/21, pt is overdue for follow-up. 12 month recall in [place], msg sent to schedulers to contact pt for follow-up. Last labs 10/19/22 Creat 0.57 at [facility] per [facility].  Age 72, weight 44.2kg, based on specified criteria pt is on appropriate dosage of Eliquis 5mg  BID for afib.  Will await appt to refill rx.

## 2022-12-19 NOTE — Telephone Encounter (Signed)
Schedulers left a message for the pt to call to schedule an appt.

## 2022-12-20 NOTE — Telephone Encounter (Signed)
Left a message for the patient to call back regarding an appointment with her Physician.

## 2022-12-22 NOTE — Telephone Encounter (Signed)
Pt scheduled follow-up appt with Dr Sallyanne Kuster for 05/10/23.  Will refill Eliquis to get pt to upcoming appt.

## 2023-01-09 DIAGNOSIS — J449 Chronic obstructive pulmonary disease, unspecified: Secondary | ICD-10-CM | POA: Diagnosis not present

## 2023-02-02 ENCOUNTER — Telehealth: Payer: Self-pay | Admitting: Internal Medicine

## 2023-02-02 DIAGNOSIS — J9611 Chronic respiratory failure with hypoxia: Secondary | ICD-10-CM

## 2023-02-02 NOTE — Telephone Encounter (Signed)
I called and spoke with the pt's daughter  She states that pt is moving to [facility] right now in route  She needs [place] to send order to [facility] there for o2 to be delivered ASAP  I have placed urgent order and msged PCC's. Dr Sherene Sires to sign order now.

## 2023-02-08 DIAGNOSIS — J449 Chronic obstructive pulmonary disease, unspecified: Secondary | ICD-10-CM | POA: Diagnosis not present

## 2023-03-11 DIAGNOSIS — J449 Chronic obstructive pulmonary disease, unspecified: Secondary | ICD-10-CM | POA: Diagnosis not present

## 2023-03-20 ENCOUNTER — Other Ambulatory Visit: Payer: Self-pay | Admitting: Internal Medicine

## 2023-03-29 ENCOUNTER — Telehealth: Payer: Self-pay | Admitting: *Deleted

## 2023-03-29 DIAGNOSIS — R634 Abnormal weight loss: Secondary | ICD-10-CM | POA: Diagnosis not present

## 2023-03-29 DIAGNOSIS — D649 Anemia, unspecified: Secondary | ICD-10-CM | POA: Diagnosis not present

## 2023-03-29 DIAGNOSIS — R195 Other fecal abnormalities: Secondary | ICD-10-CM | POA: Diagnosis not present

## 2023-03-29 NOTE — Telephone Encounter (Signed)
   Pre-operative Risk Assessment    Patient Name: [name]  DOB: 16-Mar-1951 MRN: [id number]      Request for Surgical Clearance    Procedure:   COLONOSCOPY ; OCCULT BLOOD POSITIVE STOOL, UNINTENTIONAL WEIGHT LOSS, NORMOCYTIC ANEMIA   Date of Surgery:  Clearance TBD                                 Surgeon:  DR. Liliane Shi Surgeon's Group or Practice Name:  [facility] Phone number:  [phone] Fax number:  [phone]   Type of Clearance Requested:   - Medical  - Pharmacy:  Hold Apixaban (Eliquis) x 2 DAYS PRIOR   Type of Anesthesia:   PROPOFOL   Additional requests/questions:    [name]   03/29/2023, 5:51 PM

## 2023-03-30 NOTE — Telephone Encounter (Signed)
Left message to call back to schedule an appt with APP for cardiac clearance. I reviewed Dr. Royann Shivers schedule and did not see where we could get the pt in sooner with MD. Left message to keep her appt 05/10/23 with Dr. Royann Shivers as this is her follow up appt with MD.

## 2023-03-30 NOTE — Telephone Encounter (Signed)
Patient with diagnosis of A Fib on Eliquis for anticoagulation.    Procedure: COLONOSCOPY ; OCCULT BLOOD POSITIVE STOOL, UNINTENTIONAL WEIGHT LOSS, NORMOCYTIC ANEMIA  Date of procedure: TBD   CHA2DS2-VASc Score = 4  This indicates a 4.8% annual risk of stroke. The patient's score is based upon: CHF History: 0 HTN History: 1 Diabetes History: 1 Stroke History: 0 Vascular Disease History: 0 Age Score: 1 Gender Score: 1     CrCl 60 ml/min Platelet count 215K   Per office protocol, patient can hold Eliquis for 2 days prior to procedure.     **This guidance is not considered finalized until pre-operative APP has relayed final recommendations.**

## 2023-03-30 NOTE — Telephone Encounter (Signed)
   Name: Sybilla Malhotra  DOB: Jan 08, 1951  MRN: [id number]  Primary Cardiologist: Thurmon Fair, MD  Chart reviewed as part of pre-operative protocol coverage. Because of Lolitha Tortora past medical history and time since last visit, she will require a follow-up in-office visit in order to better assess preoperative cardiovascular risk.  Pre-op covering staff: - Please schedule appointment and call patient to inform them. If patient already had an upcoming appointment within acceptable timeframe, please add "pre-op clearance" to the appointment notes so provider is aware. - Please contact requesting surgeon's office via preferred method (i.e, phone, fax) to inform them of need for appointment prior to surgery.  This message will also be routed to pharmacy pool  for input on holding Eliquis as requested below so that this information is available to the clearing provider at time of patient's appointment.   Napoleon Form, Leodis Rains, NP  03/30/2023, 7:51 AM

## 2023-03-31 NOTE — Telephone Encounter (Signed)
Patient notified directly and voiced understanding to keep upcoming appointment with Dr Royann Shivers.

## 2023-04-04 ENCOUNTER — Telehealth: Payer: Self-pay | Admitting: Primary Care

## 2023-04-04 NOTE — Telephone Encounter (Signed)
Fax received from Dr. Liliane Shi with [facility] to perform a Colonoscopy on patient.  Patient needs surgery clearance. Surgery is Pending. Patient was seen on 08/01/22. Office protocol is a risk assessment can be sent to surgeon if patient has been seen in 60 days or less.   Sending to [facility] for risk assessment or recommendations if patient needs to be seen in office prior to surgical procedure.  Beth patient has been scheduled for OV on 04/28/23

## 2023-04-10 DIAGNOSIS — J449 Chronic obstructive pulmonary disease, unspecified: Secondary | ICD-10-CM | POA: Diagnosis not present

## 2023-04-25 DIAGNOSIS — J9611 Chronic respiratory failure with hypoxia: Secondary | ICD-10-CM | POA: Diagnosis not present

## 2023-04-25 DIAGNOSIS — Z0000 Encounter for general adult medical examination without abnormal findings: Secondary | ICD-10-CM | POA: Diagnosis not present

## 2023-04-25 DIAGNOSIS — E1169 Type 2 diabetes mellitus with other specified complication: Secondary | ICD-10-CM | POA: Diagnosis not present

## 2023-04-25 DIAGNOSIS — D6869 Other thrombophilia: Secondary | ICD-10-CM | POA: Diagnosis not present

## 2023-04-25 DIAGNOSIS — J449 Chronic obstructive pulmonary disease, unspecified: Secondary | ICD-10-CM | POA: Diagnosis not present

## 2023-04-25 DIAGNOSIS — I700 Atherosclerosis of aorta: Secondary | ICD-10-CM | POA: Diagnosis not present

## 2023-04-25 DIAGNOSIS — E119 Type 2 diabetes mellitus without complications: Secondary | ICD-10-CM | POA: Diagnosis not present

## 2023-04-25 DIAGNOSIS — E46 Unspecified protein-calorie malnutrition: Secondary | ICD-10-CM | POA: Diagnosis not present

## 2023-04-25 DIAGNOSIS — E7800 Pure hypercholesterolemia, unspecified: Secondary | ICD-10-CM | POA: Diagnosis not present

## 2023-04-25 DIAGNOSIS — I5189 Other ill-defined heart diseases: Secondary | ICD-10-CM | POA: Diagnosis not present

## 2023-04-25 DIAGNOSIS — E039 Hypothyroidism, unspecified: Secondary | ICD-10-CM | POA: Diagnosis not present

## 2023-04-25 DIAGNOSIS — I2781 Cor pulmonale (chronic): Secondary | ICD-10-CM | POA: Diagnosis not present

## 2023-04-25 DIAGNOSIS — I4892 Unspecified atrial flutter: Secondary | ICD-10-CM | POA: Diagnosis not present

## 2023-04-28 ENCOUNTER — Ambulatory Visit: Payer: 59

## 2023-04-28 ENCOUNTER — Encounter: Payer: Self-pay | Admitting: Primary Care

## 2023-04-28 ENCOUNTER — Ambulatory Visit (INDEPENDENT_AMBULATORY_CARE_PROVIDER_SITE_OTHER): Payer: 59 | Admitting: Primary Care

## 2023-04-28 VITALS — BP 126/70 | HR 89 | Temp 97.9°F | Ht 60.5 in | Wt 93.2 lb

## 2023-04-28 DIAGNOSIS — R918 Other nonspecific abnormal finding of lung field: Secondary | ICD-10-CM | POA: Diagnosis not present

## 2023-04-28 DIAGNOSIS — Z01818 Encounter for other preprocedural examination: Secondary | ICD-10-CM | POA: Diagnosis not present

## 2023-04-28 DIAGNOSIS — J9611 Chronic respiratory failure with hypoxia: Secondary | ICD-10-CM | POA: Diagnosis not present

## 2023-04-28 DIAGNOSIS — J9612 Chronic respiratory failure with hypercapnia: Secondary | ICD-10-CM | POA: Diagnosis not present

## 2023-04-28 DIAGNOSIS — J449 Chronic obstructive pulmonary disease, unspecified: Secondary | ICD-10-CM

## 2023-04-28 DIAGNOSIS — Z01811 Encounter for preprocedural respiratory examination: Secondary | ICD-10-CM | POA: Diagnosis not present

## 2023-04-28 NOTE — Assessment & Plan Note (Addendum)
-   Patient will be considered high risk for prolonged mechanical ventilation and/or postop pulmonary complications due to severe COPD, chronic respiratory failure and recent covid infection. Recommend preop chest x-ray today. Advise colonoscopy done in hospital setting. Encourage incentive spirometer x10/hour.   Major Pulmonary risks identified in the multifactorial risk analysis are but not limited to a) pneumonia; b) recurrent intubation risk; c) prolonged or recurrent acute respiratory failure needing mechanical ventilation; d) prolonged hospitalization; e) DVT/Pulmonary embolism; f) Acute Pulmonary edema  Recommend 1. Short duration of surgery as much as possible and avoid paralytic if possible 2. Recovery in step down or ICU with Pulmonary consultation if needed  3. DVT prophylaxis 4. Aggressive pulmonary toilet with o2, bronchodilatation, and incentive spirometry and early ambulation

## 2023-04-28 NOTE — Patient Instructions (Addendum)
You will be considered high risk for mechanical ventilation and/or postop pulmonary complications due to your history COPD/respiratory failure and recent covid infection. Recommend procedure be done in hospital setting.   Continue to use Anoro Ellipta 1 puff daily Practice deep breathing exercises before and after colonoscopy procedure with Incentive spirometer You can use 2-4L POC to maintain O2 >88-90%   Orders: CXR re: pre-op resp clearance   Follow-up:  6 months with Dr. Sherene Sires

## 2023-04-28 NOTE — Progress Notes (Signed)
@Patient  ID: [name], female    DOB: 1950-10-20, 72 y.o.   MRN: [id number]  Chief Complaint  Patient presents with   Follow-up    Referring provider: Renford Dills, MD  HPI: 72 year old female, former smoker.  Past medical history significant for hypertension, A-fib, COPD Gold 3, chronic respiratory failure with hypoxia, hypothyroidism, hyperlipidemia.   04/28/2023 Patient presents today for surgical risk assessment for colonoscopy procedure, date to be determined. Colonoscopy will be done with Dr. Liliane Shi with [facility].  Patient has received okay from cardiology to hold Eliquis 2 days prior.  Patient follows with Dr. Sherene Sires for history of severe COPD and chronic respiratory failure.   She is doing alright today. She had covid last month and was treated with antiviral, did not require hospitalization. Breathing can be exacerbated by heat/humidity or temperature change. She does fine indoors in the [facility]. She only has trouble breathing wise when she leaves the house. She has an occasional productive cough with clear sputum. She is compliant with Anoro Ellipta one puff daily. She uses albuterol 1-2 times a month. CAT score 16  No Known Allergies  Immunization History  Administered Date(s) Administered   Influenza Split 07/24/2011, 06/25/2014, 07/04/2015   Influenza Whole 07/03/2016   Influenza, High Dose Seasonal PF 06/23/2017   Influenza-Unspecified 06/03/2014   Pneumococcal Conjugate-13 07/17/2014   Pneumococcal Polysaccharide-23 06/03/2012    Past Medical History:  Diagnosis Date   Acute on chronic respiratory failure (HCC) 01/17/2016   Chronic bronchitis    COPD (chronic obstructive pulmonary disease) (HCC)    Hyperlipidemia    Hypertension    Hypothyroidism    Post-menopausal     Tobacco History: Social History   Tobacco Use  Smoking Status Former   Current packs/day: 0.00   Average packs/day: 0.5 packs/day for 20.0 years (10.0 ttl pk-yrs)   Types:  Cigarettes   Start date: 11/03/1988   Quit date: 11/03/2008   Years since quitting: 14.4  Smokeless Tobacco Never   Counseling given: Not Answered   Outpatient Medications Prior to Visit  Medication Sig Dispense Refill   albuterol (PROVENTIL) (2.5 MG/3ML) 0.083% nebulizer solution Take 3 mLs (2.5 mg total) by nebulization every 2 (two) hours as needed for wheezing or shortness of breath. 75 mL 0   albuterol (VENTOLIN HFA) 108 (90 Base) MCG/ACT inhaler Inhale 1-2 puffs into the lungs every 6 (six) hours as needed for wheezing or shortness of breath. 8.6 g 4   alendronate (FOSAMAX) 70 MG tablet Take 70 mg by mouth every Monday.  11   ANORO ELLIPTA 62.5-25 MCG/ACT AEPB INHALE 1 PUFF BY MOUTH INTO LUNGS ONCE DAILY 60 each 5   apixaban (ELIQUIS) 5 MG TABS tablet Take 1 tablet (5 mg total) by mouth 2 (two) times daily. OVERDUE for follow-up, MUST see MD for FUTURE refills. 60 tablet 4   atenolol (TENORMIN) 25 MG tablet Take 25 mg by mouth daily.     Cyanocobalamin (VITAMIN B-12 PO) Take 500 mcg by mouth daily.     diltiazem (CARDIZEM CD) 120 MG 24 hr capsule TAKE ONE CAPSULE BY MOUTH daily 30 capsule 11   levothyroxine (SYNTHROID, LEVOTHROID) 75 MCG tablet Take 75 mcg by mouth daily.  3   Magnesium 400 MG CAPS Take 400 mg by mouth daily.     metFORMIN (GLUCOPHAGE) 500 MG tablet Take 500 mg by mouth daily.     mirtazapine (REMERON) 15 MG tablet Take 15 mg by mouth at bedtime.  Multiple Vitamin (MULTIVITAMIN WITH MINERALS) TABS tablet Take 1 tablet by mouth daily.     OXYGEN Pt uses 2lpm with sleep and 4 lpm with exertion  DME- [facility]     rosuvastatin (CRESTOR) 20 MG tablet Take 20 mg by mouth daily.     vitamin C (ASCORBIC ACID) 500 MG tablet Take 500 mg by mouth daily.     vitamin E 180 MG (400 UNITS) capsule Take 400 Units by mouth daily.     diclofenac Sodium (VOLTAREN) 1 % GEL Apply 2 g topically 4 (four) times daily. (Patient not taking: Reported on 04/28/2023) 150 g 0   FEROSUL 325 (65 Fe) MG  tablet Take 325 mg by mouth daily. (Patient not taking: Reported on 04/28/2023)     guaiFENesin (MUCINEX) 600 MG 12 hr tablet Take 600 mg by mouth 2 (two) times daily. 1 Tablet Twice Daily (Patient not taking: Reported on 04/28/2023)     PFIZER-BIONT COVID-19 VAC-TRIS SUSP injection  (Patient not taking: Reported on 04/28/2023)     No facility-administered medications prior to visit.   Review of Systems  Review of Systems  Constitutional: Negative.   HENT: Negative.    Respiratory:  Positive for cough and shortness of breath. Negative for chest tightness and wheezing.   Cardiovascular: Negative.    Physical Exam  BP 126/70 (BP Location: Left Arm, Patient Position: Sitting, Cuff Size: Normal)   Pulse 89   Temp 97.9 F (36.6 C) (Oral)   Ht 5' 0.5" (1.537 m)   Wt 93 lb 3.2 oz (42.3 kg)   SpO2 96% Comment: 3L oxygen continuous (tank)  BMI 17.90 kg/m  Physical Exam Constitutional:      General: She is not in acute distress.    Appearance: She is not ill-appearing.  HENT:     Head: Normocephalic and atraumatic.     Mouth/Throat:     Mouth: Mucous membranes are moist.     Pharynx: Oropharynx is clear.  Cardiovascular:     Rate and Rhythm: Normal rate and regular rhythm.  Pulmonary:     Effort: Pulmonary effort is normal.     Breath sounds: Normal breath sounds. No wheezing, rhonchi or rales.     Comments: Clear, diminished. 02 Neurological:     General: No focal deficit present.     Mental Status: She is oriented to person, place, and time. Mental status is at baseline.  Psychiatric:        Mood and Affect: Mood normal.        Behavior: Behavior normal.        Thought Content: Thought content normal.        Judgment: Judgment normal.      Lab Results:  CBC    Component Value Date/Time   WBC 7.9 11/06/2020 0404   RBC 3.15 (L) 11/06/2020 0404   HGB 9.4 (L) 11/06/2020 0404   HCT 27.8 (L) 11/06/2020 0404   PLT 260 11/06/2020 0404   MCV 88.3 11/06/2020 0404   MCV 95.1  10/19/2013 1810   MCH 29.8 11/06/2020 0404   MCHC 33.8 11/06/2020 0404   RDW 13.0 11/06/2020 0404   LYMPHSABS 1.7 11/06/2020 0404   MONOABS 1.2 (H) 11/06/2020 0404   EOSABS 0.2 11/06/2020 0404   BASOSABS 0.0 11/06/2020 0404    BMET    Component Value Date/Time   NA 143 11/06/2020 0404   K 3.2 (L) 11/06/2020 0404   CL 102 11/06/2020 0404   CO2 30 11/06/2020 0404  GLUCOSE 146 (H) 11/06/2020 0404   BUN <5 (L) 11/06/2020 0404   CREATININE 0.59 11/06/2020 0404   CALCIUM 8.7 (L) 11/06/2020 0404   GFRNONAA >60 11/06/2020 0404   GFRAA >60 01/21/2016 0510    BNP No results found for: "BNP"  ProBNP No results found for: "PROBNP"  Imaging: No results found.   Assessment & Plan:   COPD GOLD III criteria but 02 dep - Patient has severe COPD with high symptom burden but not acutely exacerbated. Dyspnea is worse with heat/humidity. Continue Anoro Ellipta 1 puff daily and prn Albuterol.   Chronic respiratory failure with hypoxia and hypercapnia (HCC) - Continue 3L supplemental oxygen; needs to use 4L with POC to maintain O2 >88-90%  Encounter for pre-operative respiratory clearance - Patient will be considered high risk for prolonged mechanical ventilation and/or postop pulmonary complications due to severe COPD, chronic respiratory failure and recent covid infection. Recommend preop chest x-ray today. Advise colonoscopy done in hospital setting. Encourage incentive spirometer x10/hour.   Major Pulmonary risks identified in the multifactorial risk analysis are but not limited to a) pneumonia; b) recurrent intubation risk; c) prolonged or recurrent acute respiratory failure needing mechanical ventilation; d) prolonged hospitalization; e) DVT/Pulmonary embolism; f) Acute Pulmonary edema  Recommend 1. Short duration of surgery as much as possible and avoid paralytic if possible 2. Recovery in step down or ICU with Pulmonary consultation if needed  3. DVT prophylaxis 4. Aggressive  pulmonary toilet with o2, bronchodilatation, and incentive spirometry and early ambulation      1) RISK FOR PROLONGED MECHANICAL VENTILAION - > 48h  1A) Arozullah - Prolonged mech ventilation risk Arozullah Postperative Pulmonary Risk Score - for mech ventilation dependence >48h USAA, Ann Surg 2000, major non-cardiac surgery) Comment Score  Type of surgery - abd ao aneurysm (27), thoracic (21), neurosurgery / upper abdominal / vascular (21), neck (11) Colonoscopy  6  Emergency Surgery - (11)  0  ALbumin < 3 or poor nutritional state - (9)  0  BUN > 30 -  (8)  0  Partial or completely dependent functional status - (7)  0  COPD -  (6)  6  Age - 60 to 69 (4), > 70  (6)  6  TOTAL  18  Risk Stratifcation scores  - < 10 (0.5%), 11-19 (1.8%), 20-27 (4.2%), 28-40 (10.1%), >40 (26.6%)  1.8% risk for prolonged mechanical ventilation       1B) GUPTA - Prolonged Mech Vent Risk Score source Risk  Guptal post op prolonged mech ventilation > 48h or reintubation < 30 days - ACS 2007-2008 dataset - [email or web address] 2.3 % Risk of mechanical ventilation for >48 hrs after surgery, or unplanned intubation ?30 days of surgery    2) RISK FOR POST OP PNEUMONIA Score source Risk  Chales Abrahams - Post Op Pnemounia risk  [employer] 3.8 % Risk of postoperative pneumonia    R3) ISK FOR ANY POST-OP PULMONARY COMPLICATION Score source Risk  CANET/ARISCAT Score - risk for ANY/ALl pulmonary complications - > risk of in-hospital post-op pulmonary complications (composite including respiratory failure, respiratory infection, pleural effusion, atelectasis, pneumothorax, bronchospasm, aspiration pneumonitis) [employer].es - based on age, anemia, pulse ox, resp infection prior 30d, incision site, duration of surgery, and emergency v elective surgery Low  risk 1.6% risk of in-hospital post-op pulmonary complications (composite including respiratory failure, respiratory infection, pleural effusion, atelectasis, pneumothorax, bronchospasm, aspiration pneumonitis)     Glenford Bayley, NP 04/28/2023

## 2023-04-28 NOTE — Assessment & Plan Note (Signed)
-   Continue 3L supplemental oxygen; needs to use 4L with POC to maintain O2 >88-90%

## 2023-04-28 NOTE — Assessment & Plan Note (Signed)
-   Patient has severe COPD with high symptom burden but not acutely exacerbated. Dyspnea is worse with heat/humidity. Continue Anoro Ellipta 1 puff daily and prn Albuterol.

## 2023-05-01 NOTE — Telephone Encounter (Signed)
OV notes and clearance form have been faxed back to [facility]. Nothing further needed at this time.  

## 2023-05-05 ENCOUNTER — Other Ambulatory Visit: Payer: Self-pay | Admitting: Internal Medicine

## 2023-05-10 ENCOUNTER — Encounter: Payer: Self-pay | Admitting: Cardiovascular Disease

## 2023-05-10 ENCOUNTER — Ambulatory Visit: Payer: 59 | Admitting: Cardiovascular Disease

## 2023-05-10 VITALS — BP 124/74 | HR 72 | Ht 60.5 in | Wt 91.4 lb

## 2023-05-10 DIAGNOSIS — I700 Atherosclerosis of aorta: Secondary | ICD-10-CM | POA: Diagnosis not present

## 2023-05-10 DIAGNOSIS — R64 Cachexia: Secondary | ICD-10-CM | POA: Diagnosis not present

## 2023-05-10 DIAGNOSIS — E119 Type 2 diabetes mellitus without complications: Secondary | ICD-10-CM

## 2023-05-10 DIAGNOSIS — I2510 Atherosclerotic heart disease of native coronary artery without angina pectoris: Secondary | ICD-10-CM

## 2023-05-10 DIAGNOSIS — I10 Essential (primary) hypertension: Secondary | ICD-10-CM | POA: Diagnosis not present

## 2023-05-10 DIAGNOSIS — I2584 Coronary atherosclerosis due to calcified coronary lesion: Secondary | ICD-10-CM

## 2023-05-10 DIAGNOSIS — J449 Chronic obstructive pulmonary disease, unspecified: Secondary | ICD-10-CM

## 2023-05-10 DIAGNOSIS — E7800 Pure hypercholesterolemia, unspecified: Secondary | ICD-10-CM

## 2023-05-10 DIAGNOSIS — I483 Typical atrial flutter: Secondary | ICD-10-CM

## 2023-05-10 DIAGNOSIS — I2781 Cor pulmonale (chronic): Secondary | ICD-10-CM | POA: Diagnosis not present

## 2023-05-10 DIAGNOSIS — Z01810 Encounter for preprocedural cardiovascular examination: Secondary | ICD-10-CM

## 2023-05-10 DIAGNOSIS — Z7984 Long term (current) use of oral hypoglycemic drugs: Secondary | ICD-10-CM

## 2023-05-10 DIAGNOSIS — I2721 Secondary pulmonary arterial hypertension: Secondary | ICD-10-CM

## 2023-05-10 DIAGNOSIS — D6869 Other thrombophilia: Secondary | ICD-10-CM

## 2023-05-10 DIAGNOSIS — J9611 Chronic respiratory failure with hypoxia: Secondary | ICD-10-CM | POA: Diagnosis not present

## 2023-05-10 HISTORY — DX: Type 2 diabetes mellitus without complications: E11.9

## 2023-05-10 NOTE — Patient Instructions (Signed)
Medication Instructions:  Your physician recommends that you continue on your current medications as directed. Please refer to the Current Medication list given to you today.  *If you need a refill on your cardiac medications before your next appointment, please call your pharmacy*  Follow-Up: At [facility], you and your health needs are our priority.  As part of our continuing mission to provide you with exceptional heart care, we have created designated Provider Care Teams.  These Care Teams include your primary Cardiologist (physician) and Advanced Practice Providers (APPs -  Physician Assistants and Nurse Practitioners) who all work together to provide you with the care you need, when you need it.  Your next appointment:   1 year  Provider:   Thurmon Fair, MD
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
## 2023-05-11 DIAGNOSIS — J449 Chronic obstructive pulmonary disease, unspecified: Secondary | ICD-10-CM | POA: Diagnosis not present

## 2023-05-17 ENCOUNTER — Other Ambulatory Visit: Payer: Self-pay | Admitting: Cardiovascular Disease

## 2023-05-17 NOTE — Telephone Encounter (Signed)
Pt last saw Dr Royann Shivers 05/10/23, last labs 03/29/23 Creat 0.57 at [facility] per [facility], age 72, weight 41.5kg, based on specified criteria pt is on appropriate dosage of Eliquis 5mg  BID for aflutter.  Will refill rx.

## 2023-05-23 ENCOUNTER — Emergency Department (HOSPITAL_BASED_OUTPATIENT_CLINIC_OR_DEPARTMENT_OTHER): Payer: 59 | Admitting: Radiology

## 2023-05-23 ENCOUNTER — Emergency Department (HOSPITAL_BASED_OUTPATIENT_CLINIC_OR_DEPARTMENT_OTHER)
Admission: EM | Admit: 2023-05-23 | Discharge: 2023-05-23 | Disposition: A | Discharge status: Home / Self Care | Payer: 59 | Attending: Emergency Medicine | Admitting: Emergency Medicine

## 2023-05-23 ENCOUNTER — Encounter (HOSPITAL_BASED_OUTPATIENT_CLINIC_OR_DEPARTMENT_OTHER): Payer: Self-pay

## 2023-05-23 ENCOUNTER — Other Ambulatory Visit: Payer: Self-pay

## 2023-05-23 DIAGNOSIS — E039 Hypothyroidism, unspecified: Secondary | ICD-10-CM | POA: Diagnosis not present

## 2023-05-23 DIAGNOSIS — E119 Type 2 diabetes mellitus without complications: Secondary | ICD-10-CM | POA: Diagnosis not present

## 2023-05-23 DIAGNOSIS — R918 Other nonspecific abnormal finding of lung field: Secondary | ICD-10-CM | POA: Diagnosis not present

## 2023-05-23 DIAGNOSIS — R0602 Shortness of breath: Secondary | ICD-10-CM | POA: Insufficient documentation

## 2023-05-23 DIAGNOSIS — J449 Chronic obstructive pulmonary disease, unspecified: Secondary | ICD-10-CM | POA: Diagnosis not present

## 2023-05-23 DIAGNOSIS — Z79899 Other long term (current) drug therapy: Secondary | ICD-10-CM | POA: Insufficient documentation

## 2023-05-23 DIAGNOSIS — Z20822 Contact with and (suspected) exposure to covid-19: Secondary | ICD-10-CM | POA: Diagnosis not present

## 2023-05-23 DIAGNOSIS — Z7901 Long term (current) use of anticoagulants: Secondary | ICD-10-CM | POA: Diagnosis not present

## 2023-05-23 DIAGNOSIS — J441 Chronic obstructive pulmonary disease with (acute) exacerbation: Secondary | ICD-10-CM | POA: Diagnosis not present

## 2023-05-23 DIAGNOSIS — I10 Essential (primary) hypertension: Secondary | ICD-10-CM | POA: Diagnosis not present

## 2023-05-23 DIAGNOSIS — Z7951 Long term (current) use of inhaled steroids: Secondary | ICD-10-CM | POA: Diagnosis not present

## 2023-05-23 DIAGNOSIS — Z7984 Long term (current) use of oral hypoglycemic drugs: Secondary | ICD-10-CM | POA: Diagnosis not present

## 2023-05-23 LAB — CBC WITH DIFFERENTIAL/PLATELET
Abs Immature Granulocytes: 0.01 10*3/uL (ref 0.00–0.07)
Basophils Absolute: 0 10*3/uL (ref 0.0–0.1)
Basophils Relative: 1 %
Eosinophils Absolute: 0.1 10*3/uL (ref 0.0–0.5)
Eosinophils Relative: 2 %
HCT: 38.8 % (ref 36.0–46.0)
Hemoglobin: 12.5 g/dL (ref 12.0–15.0)
Immature Granulocytes: 0 %
Lymphocytes Relative: 39 %
Lymphs Abs: 1.7 10*3/uL (ref 0.7–4.0)
MCH: 29 pg (ref 26.0–34.0)
MCHC: 32.2 g/dL (ref 30.0–36.0)
MCV: 90 fL (ref 80.0–100.0)
Monocytes Absolute: 0.5 10*3/uL (ref 0.1–1.0)
Monocytes Relative: 12 %
Neutro Abs: 2 10*3/uL (ref 1.7–7.7)
Neutrophils Relative %: 46 %
Platelets: 254 10*3/uL (ref 150–400)
RBC: 4.31 MIL/uL (ref 3.87–5.11)
RDW: 13 % (ref 11.5–15.5)
WBC: 4.4 10*3/uL (ref 4.0–10.5)
nRBC: 0 % (ref 0.0–0.2)

## 2023-05-23 LAB — BASIC METABOLIC PANEL
Anion gap: 9 (ref 5–15)
BUN: 11 mg/dL (ref 8–23)
CO2: 35 mmol/L — ABNORMAL HIGH (ref 22–32)
Calcium: 10.8 mg/dL — ABNORMAL HIGH (ref 8.9–10.3)
Chloride: 97 mmol/L — ABNORMAL LOW (ref 98–111)
Creatinine, Ser: 0.51 mg/dL (ref 0.44–1.00)
GFR, Estimated: >60 mL/min (ref >60)
Glucose, Bld: 114 mg/dL — ABNORMAL HIGH (ref 70–99)
Potassium: 5.1 mmol/L (ref 3.5–5.1)
Sodium: 141 mmol/L (ref 135–145)

## 2023-05-23 LAB — D-DIMER, QUANTITATIVE: D-Dimer, Quant: 0.28 ug{FEU}/mL (ref 0.00–0.50)

## 2023-05-23 LAB — SARS CORONAVIRUS 2 BY RT PCR: SARS Coronavirus 2 by RT PCR: NEGATIVE

## 2023-05-23 LAB — BRAIN NATRIURETIC PEPTIDE: B Natriuretic Peptide: 15 pg/mL (ref 0.0–100.0)

## 2023-05-23 LAB — TROPONIN I (HIGH SENSITIVITY)
Troponin I (High Sensitivity): 4 ng/L (ref <18)
Troponin I (High Sensitivity): 4 ng/L (ref <18)

## 2023-05-23 MED ORDER — IPRATROPIUM-ALBUTEROL 0.5-2.5 (3) MG/3ML IN SOLN
3.0000 mL | Freq: Once | RESPIRATORY_TRACT | Status: AC
  Administered 2023-05-23: 3 mL via RESPIRATORY_TRACT
  Filled 2023-05-23: qty 3

## 2023-05-23 MED ORDER — METHYLPREDNISOLONE SODIUM SUCC 125 MG IJ SOLR
125.0000 mg | Freq: Once | INTRAMUSCULAR | Status: AC
  Administered 2023-05-23: 125 mg via INTRAVENOUS
  Filled 2023-05-23: qty 2

## 2023-05-23 MED ORDER — PREDNISONE 20 MG PO TABS: 40.0000 mg | ORAL_TABLET | Freq: Every day | ORAL | 0 refills | Status: AC

## 2023-05-23 MED ORDER — ALBUTEROL SULFATE HFA 108 (90 BASE) MCG/ACT IN AERS
2.0000 | INHALATION_SPRAY | RESPIRATORY_TRACT | Status: DC | PRN
  Filled 2023-05-23: qty 6.7

## 2023-05-23 NOTE — Discharge Instructions (Signed)
I recommend you call your doctor tomorrow to schedule follow-up.  You should continue using her inhalers at home as well as a course of steroids for COPD.  If you develop worsening shortness of breath, chest pain, fainting or any other new concerning symptoms you should return to the ED.

## 2023-05-23 NOTE — ED Provider Notes (Signed)
72 year old female history of COPD, hypertension, chronic respite failure on 2 L of oxygen, type 2 diabetes presented for shortness of breath.  Been worsening for couple weeks, no lower extremity edema, no chest pain.  Reportedly desatted at home with ambulation.  Here normal work of breathing, slight wheezing bilaterally.  Work appears reassuring, troponin negative, EKG without signs of acute ischemia.  Chest x-ray without pneumonia.  She is COVID-negative, low with no leukocytosis.  BMP notable for alkalosis likely related to her COPD.  D-dimer is negative, reassuring its PE.  After steroids, DuoNebs she has returned on normal work of breathing.  No wheezing, no hypoxia.  She was able to ambulate without hypoxia as well.  Suspect COPD exacerbation.  No increased sputum production or fever, no indication for antibiotics.  Given she is on her baseline oxygen requirement, will plan to treat with course of steroids and recommend close PCP follow-up.  Discharged with strict return precautions.   Laurence Spates, MD 05/24/23 [phone]

## 2023-05-23 NOTE — ED Notes (Addendum)
Checked pt pulse oximetry while ambulating. Pt ambulated around entire unit and remained on 3L [place] the entire time.  Pt oxygen saturation sitting started at 98%. Standing, pt oxygen saturation dropped to 94%.  During ambulation, pt oxygen saturation dropped to and consistently remained between 90-93%. Pt reported no shortness or brain or difficulty breathing during ambulation and returned to room and placed back on monitor without difficulty.

## 2023-05-23 NOTE — ED Provider Notes (Signed)
[place] EMERGENCY DEPARTMENT AT [facility] Provider Note   CSN: [id number] Arrival date & time: 05/23/23  1546     History  Chief Complaint  Patient presents with   Shortness of Breath    Eleonor Karim is a 72 y.o. female with a past medical history significant for COPD, hypothyroidism, hypertension, chronic respiratory failure on 2 L nasal cannula at rest and 3 L with activity, hyperlipidemia, type 2 diabetes, paroxysmal A-fib on Eliquis who presents to the ED due to worsening shortness of breath with exertion for the past 1 to 2 weeks.  Patient states when she is ambulating her oxygen will drop down in the 70s on 3 L nasal cannula.  Denies any change in her chronic cough.  No fever or chills.  Had COVID 1 month ago.  Denies wheezing.  No lower extremity edema.  No history of CHF. Denies associated chest pain. No sick contacts.   History obtained from patient and past medical records. No interpreter used during encounter.       Home Medications Prior to Admission medications   Medication Sig Start Date End Date Taking? Authorizing Provider  albuterol (PROVENTIL) (2.5 MG/3ML) 0.083% nebulizer solution Take 3 mLs (2.5 mg total) by nebulization every 2 (two) hours as needed for wheezing or shortness of breath. Patient not taking: Reported on 05/10/2023 10/21/13   Maretta Bees, MD  albuterol (VENTOLIN HFA) 108 (90 Base) MCG/ACT inhaler Inhale 1-2 puffs into the lungs every 6 (six) hours as needed for wheezing or shortness of breath. 06/07/22   Nyoka Cowden, MD  alendronate (FOSAMAX) 70 MG tablet Take 70 mg by mouth every Monday. 06/03/17   [provider]  Ernestina Patches 62.5-25 MCG/ACT AEPB INHALE 1 PUFF BY MOUTH INTO LUNGS ONCE DAILY 03/20/23   Nyoka Cowden, MD  apixaban (ELIQUIS) 5 MG TABS tablet TAKE ONE TABLET BY MOUTH TWICE DAILY Pt overdue for visit, must see MD for further refills 05/17/23   Croitoru, Mihai, MD  atenolol (TENORMIN) 25 MG tablet Take 25 mg  by mouth daily.    [provider]  Cyanocobalamin (VITAMIN B-12 PO) Take 500 mcg by mouth daily.    [provider]  diclofenac Sodium (VOLTAREN) 1 % GEL Apply 2 g topically 4 (four) times daily. 04/03/21   Particia Nearing, PA-C  diltiazem (CARDIZEM CD) 120 MG 24 hr capsule TAKE ONE CAPSULE BY MOUTH daily 09/19/22   Croitoru, Mihai, MD  FEROSUL 325 (65 Fe) MG tablet Take 325 mg by mouth daily. 11/19/20   [provider]  guaiFENesin (MUCINEX) 600 MG 12 hr tablet Take 600 mg by mouth 2 (two) times daily. 1 Tablet Twice Daily Patient not taking: Reported on 05/10/2023    [provider]  levothyroxine (SYNTHROID, LEVOTHROID) 75 MCG tablet Take 75 mcg by mouth daily. 05/09/17   [provider]  Magnesium 400 MG CAPS Take 400 mg by mouth daily.    [provider]  metFORMIN (GLUCOPHAGE) 500 MG tablet Take 500 mg by mouth daily. 04/14/21   [provider]  mirtazapine (REMERON) 15 MG tablet Take 15 mg by mouth at bedtime. 12/11/15   [provider]  Multiple Vitamin (MULTIVITAMIN WITH MINERALS) TABS tablet Take 1 tablet by mouth daily.    [provider]  OXYGEN Pt uses 2lpm with sleep and 4 lpm with exertion  DME- [facility]    [provider]  polyethylene glycol-electrolytes (NULYTELY) 420 g solution Take 4,000 mLs by mouth  once. Patient not taking: Reported on 05/10/2023 05/05/23   [provider]  rosuvastatin (CRESTOR) 20 MG tablet Take 20 mg by mouth daily. 04/02/21   [provider]  vitamin C (ASCORBIC ACID) 500 MG tablet Take 500 mg by mouth daily.    [provider]  vitamin E 180 MG (400 UNITS) capsule Take 400 Units by mouth daily.    [provider]      Allergies    Patient has no known allergies.    Review of Systems   Review of Systems  Constitutional:  Negative for fever.  Respiratory:  Positive for cough (chronic) and shortness of breath.   Cardiovascular:   Negative for chest pain and leg swelling.    Physical Exam Updated Vital Signs BP 128/74   Pulse 68   Temp 97.6 F (36.4 C)   Resp 15   Ht 5' (1.524 m)   Wt 42.2 kg   SpO2 100%   BMI 18.16 kg/m  Physical Exam Vitals and nursing note reviewed.  Constitutional:      General: She is not in acute distress.    Appearance: She is not ill-appearing.  HENT:     Head: Normocephalic.  Eyes:     Pupils: Pupils are equal, round, and reactive to light.  Cardiovascular:     Rate and Rhythm: Normal rate and regular rhythm.     Pulses: Normal pulses.     Heart sounds: Normal heart sounds. No murmur heard.    No friction rub. No gallop.  Pulmonary:     Effort: Pulmonary effort is normal.     Breath sounds: Normal breath sounds.     Comments: Respirations equal and unlabored, patient able to speak in full sentences, lungs clear to auscultation bilaterally. On 3L [place] Abdominal:     General: Abdomen is flat. There is no distension.     Palpations: Abdomen is soft.     Tenderness: There is no abdominal tenderness. There is no guarding or rebound.  Musculoskeletal:        General: Normal range of motion.     Cervical back: Neck supple.     Comments: No lower extremity edema  Skin:    General: Skin is warm and dry.  Neurological:     General: No focal deficit present.     Mental Status: She is alert.  Psychiatric:        Mood and Affect: Mood normal.        Behavior: Behavior normal.     ED Results / Procedures / Treatments   Labs (all labs ordered are listed, but only abnormal results are displayed) Labs Reviewed  BASIC METABOLIC PANEL - Abnormal; Notable for the following components:      Result Value   Chloride 97 (*)    CO2 35 (*)    Glucose, Bld 114 (*)    Calcium 10.8 (*)    All other components within normal limits  SARS CORONAVIRUS 2 BY RT PCR  CBC WITH DIFFERENTIAL/PLATELET  BRAIN NATRIURETIC PEPTIDE  D-DIMER, QUANTITATIVE  TROPONIN I (HIGH SENSITIVITY)  TROPONIN  I (HIGH SENSITIVITY)    EKG EKG Interpretation Date/Time:  Tuesday May 23 2023 15:57:26 EDT Ventricular Rate:  78 PR Interval:  120 QRS Duration:  72 QT Interval:  380 QTC Calculation: 433 R Axis:   80  Text Interpretation: Normal sinus rhythm Normal ECG When compared with ECG of 10-May-2023 15:40, Premature supraventricular complexes are no longer Present Confirmed by Earlene Plater,  Marja Kays [phone]) on 05/23/2023 5:06:20 PM  Radiology DG Chest Port 1 View  Result Date: 05/23/2023 CLINICAL DATA:  Shortness of breath EXAM: PORTABLE CHEST 1 VIEW COMPARISON:  Chest x-ray dated April 28, 2023 FINDINGS: Cardiac and mediastinal contours within normal limits. Lungs are hyperinflated. Mild bibasilar opacities, unchanged when compared with the prior likely due to scarring or atelectasis. No consolidation. No evidence of pleural effusion or pneumothorax. IMPRESSION: 1. No consolidation. 2. Hyperinflated lungs, suggestive of COPD. 3. Mild bibasilar opacities, unchanged when compared with the prior likely due to scarring or atelectasis. Electronically Signed   By: Allegra Lai M.D.   On: 05/23/2023 18:20    Procedures Procedures    Medications Ordered in ED Medications  albuterol (VENTOLIN HFA) 108 (90 Base) MCG/ACT inhaler 2 puff (has no administration in time range)  methylPREDNISolone sodium succinate (SOLU-MEDROL) 125 mg/2 mL injection 125 mg (125 mg Intravenous Given 05/23/23 1731)  ipratropium-albuterol (DUONEB) 0.5-2.5 (3) MG/3ML nebulizer solution 3 mL (3 mLs Nebulization Given 05/23/23 1809)    ED Course/ Medical Decision Making/ A&P                                 Medical Decision Making Amount and/or Complexity of Data Reviewed Independent Historian: caregiver External Data Reviewed: notes. Labs: ordered. Decision-making details documented in ED Course. Radiology: ordered and independent interpretation performed. Decision-making details documented in ED Course. ECG/medicine tests:  ordered and independent interpretation performed. Decision-making details documented in ED Course.  Risk Prescription drug management.   This patient presents to the ED for concern of SOB, this involves an extensive number of treatment options, and is a complaint that carries with it a high risk of complications and morbidity.  The differential diagnosis includes COPD exacerbation, CHF, viral process, PE, ACS, etc  72 year old female with a history of chronic respiratory failure on 2 L nasal cannula at rest and 3 L with activity who presents to the ED due to worsening shortness of breath for the past 1 to 2 weeks.  Patient states her oxygen will drop in the 70s with ambulation on 3 L nasal cannula.  Denies associated chest pain.  Patient has been compliant with her Eliquis.  Had COVID 1 month ago.  Upon arrival patient afebrile, not tachycardic, on 3 L nasal cannula with O2 saturation at 95%.  Patient tachypneic.  Patient in no acute distress.  Speaking full sentences.  Lungs clear to auscultation bilaterally.  No stridor or wheeze. Possible slight decreased air movement? No lower extremity edema.  Routine labs ordered.  Troponin to rule out ACS.  Chest x-ray to rule out evidence of pneumonia.  Will ambulate patient with pulse ox. Solu medrol and duoneb treatment given for possible COPD exacerbation due to decreased air movement.. Lower suspicion for PE given patient has been compliant with her Eliquis  CBC unremarkable.  No leukocytosis.  Normal hemoglobin. troponin normal.  EKG demonstrates normal sinus rhythm.  No signs of acute ischemia.  Low suspicion for ACS.  COVID Negative.  BNP normal.  Low suspicion for CHF.  BMP significant for hyperglycemia 114.  No anion gap.  Elevated CO2 at 35.  Reassessed patient after duoneb and solu medrol. Patient denies any improvement in symptoms. Added d-dimer to rule out PE.  Patient handed off to Dr. Earlene Plater who will follow-up on d-dimer and ambulation. If patient  becomes significant hypoxic on 3L, patient will require admission  Lives at  home Has PCP Hx COPD       Final Clinical Impression(s) / ED Diagnoses Final diagnoses:  Shortness of breath    Rx / DC Orders ED Discharge Orders     None         Jesusita Oka 05/23/23 1847    Laurence Spates, MD 05/24/23 [phone]

## 2023-05-23 NOTE — ED Triage Notes (Signed)
Pt c/o shortness of breath with exertion that started about a week ago. Pt reports that her oxygen sats drop with exertion.

## 2023-05-23 NOTE — ED Notes (Signed)
Spoke with lab to add on dimer

## 2023-05-25 ENCOUNTER — Encounter (HOSPITAL_COMMUNITY): Payer: Self-pay | Admitting: Internal Medicine

## 2023-05-25 ENCOUNTER — Other Ambulatory Visit: Payer: Self-pay

## 2023-05-25 NOTE — Progress Notes (Addendum)
PCP - Renford Dills, MD   Cardiologist - Dr. Royann Shivers, MD LOV preop 05-05-23 Pulm-LOV 04-28-23 [name] Dura, NP, Has follow up 9-15-or 9-16  PPM/ICD -  Device Orders -  Rep Notified -   Chest x-ray - 05-23-23 EKG - 05-23-23 Stress Test -  ECHO - 2022 Cardiac Cath -  CBC,CMP 05-23-2023  Sleep Study -  CPAP -   Fasting Blood Sugar -  Checks Blood Sugar _____ times a day  Blood Thinner Instructions:Elequis hold 2 days Aspirin Instructions:  ERAS Protcol - PRE-SURGERY Ensure or G2-    COVID vaccine -  Activity--Activity limited due to SOB wears 02 2-4 L Anesthesia review: mild PAH,Afib, COPD GOLD III, Continous O2,A-fib, DM  Patient denies shortness of breath, fever, cough and chest pain at PAT appointment   All instructions explained to the patient, with a verbal understanding of the material. Patient agrees to go over the instructions while at home for a better understanding. Patient also instructed to self quarantine after being tested for COVID-19. The opportunity to ask questions was provided.

## 2023-05-25 NOTE — Progress Notes (Signed)
Spoke with Dr. Earlie Counts assistant  Harriett Sine. Per Terance Hart , PA-C pt. Needs to be revaluated by Pulmonary prior to her procedure since her recent ER visit 05-23-23 for SOB before her procedure 06-06-23. Harriett Sine is aware.

## 2023-05-25 NOTE — Progress Notes (Signed)
Attempted to obtain medical history. Unable to reach pt. At this time. HIPAA complaint voicemail left with pre-surgical testing number. 

## 2023-06-11 DIAGNOSIS — J449 Chronic obstructive pulmonary disease, unspecified: Secondary | ICD-10-CM | POA: Diagnosis not present

## 2023-06-12 ENCOUNTER — Other Ambulatory Visit: Payer: Self-pay | Admitting: Internal Medicine

## 2023-06-13 ENCOUNTER — Telehealth: Payer: Self-pay | Admitting: Primary Care

## 2023-06-13 NOTE — Telephone Encounter (Signed)
Vic Blackbird daughter states patient's inhaler seems to be not working. Patient does not taste the inhaler. [name] phone number is [phone].

## 2023-06-14 NOTE — Telephone Encounter (Signed)
LMOM for [name] to call back regarding inhaler.

## 2023-06-15 NOTE — Telephone Encounter (Signed)
Daughter calling back again. I found this on the Anoro website:  Step4:   You may not taste or feel the medicine, even when you  are using the inhaler correctly.     I told daughter to look on website until you can call.  Has used for years.Mom having some anxiety over this concern because she never had this happen.   Her # is [phone]

## 2023-06-16 NOTE — Telephone Encounter (Signed)
Called and spoke with pt's daughter [name] about pt's Anoro. [name] said that recently when pt has been trying to use the anoro, pt is not tasting the powder in it. Pt is not sure if she is getting good use from the inhaler.  With what has been currently happening from when pt uses the Anoro, [name] wonders if this might have been part of the reason for pt's recent hospital stay as with what is happening when pt is using the Anoro now, that was how pt felt about it when she was using it prior. [name] said that pt has also been on the inhaler for years.  Please advise on this for both pt and daughter.

## 2023-06-16 NOTE — Telephone Encounter (Signed)
Will send to Dr. Sherene Sires  as this is his patient

## 2023-06-16 NOTE — Telephone Encounter (Signed)
[name] daughter has another question. [name] phone number is [phone].

## 2023-06-20 ENCOUNTER — Other Ambulatory Visit: Payer: Self-pay

## 2023-06-20 NOTE — Progress Notes (Addendum)
Anesthesia Review:  PCP: DR Waldon Merl- DR Wert  Cardiologist :  Chest x-ray : 05/23/23- 1 view  EKG : 05/23/2023  Echo : 2022  Stress test: Cardiac Cath :  Activity level:  Sleep Study/ CPAP :no sleep apnea  Fasting Blood Sugar :      / Checks Blood Sugar -- times a day:   Blood Thinner/ Instructions /Last Dose: ASA / Instructions/ Last Dose :    Oxygen - 2L at hs  and all the time and 4L with exertion   05/23/23- In ED with SOB    Eliquis-   Hold 2 days prior per pt    DM- type 2-  Metformin- none day of procedure    Had Covid in August 2024

## 2023-06-21 ENCOUNTER — Encounter: Payer: Self-pay | Admitting: Primary Care

## 2023-06-21 ENCOUNTER — Ambulatory Visit (INDEPENDENT_AMBULATORY_CARE_PROVIDER_SITE_OTHER): Payer: 59

## 2023-06-21 ENCOUNTER — Ambulatory Visit (INDEPENDENT_AMBULATORY_CARE_PROVIDER_SITE_OTHER): Payer: 59 | Admitting: Primary Care

## 2023-06-21 VITALS — BP 124/66 | HR 94 | Ht 60.0 in | Wt 93.0 lb

## 2023-06-21 DIAGNOSIS — J441 Chronic obstructive pulmonary disease with (acute) exacerbation: Secondary | ICD-10-CM

## 2023-06-21 DIAGNOSIS — J9612 Chronic respiratory failure with hypercapnia: Secondary | ICD-10-CM

## 2023-06-21 DIAGNOSIS — Z87891 Personal history of nicotine dependence: Secondary | ICD-10-CM | POA: Diagnosis not present

## 2023-06-21 DIAGNOSIS — R918 Other nonspecific abnormal finding of lung field: Secondary | ICD-10-CM | POA: Diagnosis not present

## 2023-06-21 DIAGNOSIS — J449 Chronic obstructive pulmonary disease, unspecified: Secondary | ICD-10-CM | POA: Diagnosis not present

## 2023-06-21 DIAGNOSIS — J9611 Chronic respiratory failure with hypoxia: Secondary | ICD-10-CM | POA: Diagnosis not present

## 2023-06-21 MED ORDER — ALBUTEROL SULFATE (2.5 MG/3ML) 0.083% IN NEBU: 2.5000 mg | INHALATION_SOLUTION | Freq: Four times a day (QID) | RESPIRATORY_TRACT | 12 refills | Status: DC | PRN

## 2023-06-21 MED ORDER — AZITHROMYCIN 250 MG PO TABS: ORAL_TABLET | ORAL | 0 refills | Status: DC

## 2023-06-21 NOTE — Assessment & Plan Note (Addendum)
-   Patient has severe COPD with high symptom burden.  Symptoms appear somewhat exacerbated. She has a cough with yellow mucus and intermittent wheezing per her daughter. We will get CXR today and treat her with Z-Pak as well changing her from Anoro to Trelegy Ellipta 100 mcg 1 puff daily.  Advise she start taking Mucinex 600 mg twice daily.  Recommend following up in 2 weeks prior to surgical risk assessment for colonoscopy.  Recommendations: - Stop Anoro - Start Trelegy take one puff daily (rinse mouth daily) - Start mucinex 600mg  twice daily (over the counter) - Start zpack as directed  - Make sure to use 4L oxygen at all times   Orders: CXR re: COPD  Nebulizer machine   Follow-up: 2-3 weeks with Waynetta Sandy NP after starting Trelegy

## 2023-06-21 NOTE — Patient Instructions (Addendum)
Recommendations: - Stop Anoro - Start Trelegy take one puff daily (rinse mouth daily) - Start mucinex 600mg  twice daily (over the counter) - Start zpack as directed  - Make sure to use 4L oxygen at all times   Orders: CXR re: COPD  Nebulizer machine   Follow-up: 2-3 weeks with Jasmine Sandy NP after starting Trelegy

## 2023-06-21 NOTE — Progress Notes (Signed)
Please let patient know CXR showed hyperinflated lungs consistent with COPD. No acute process such as pneumonia or bronchitis

## 2023-06-21 NOTE — Telephone Encounter (Signed)
Patient's daughter, Vic Blackbird calling to speak with a nurse regarding todays visit with [name] Dura, NP and the antibiotic she was instructed to take. Please call back at [phone].

## 2023-06-21 NOTE — Assessment & Plan Note (Signed)
-   Originally declined but after speaking today at length she is in agreement with referral to the lung cancer screening program

## 2023-06-21 NOTE — Progress Notes (Signed)
@Patient  ID: [name], female    DOB: 11-14-1950, 72 y.o.   MRN: [id number]  Chief Complaint  Patient presents with   Follow-up    Low oxygen levels. Pt is on anoro, pt is interested in changing her inhaler.     Referring provider: Renford Dills, MD  HPI: 72 year old female, former smoker.  Past medical history significant for hypertension, A-fib, COPD Gold 3, chronic respiratory failure with hypoxia, hypothyroidism, hyperlipidemia.    Previous [facility] pulmonary encounter:  04/28/2023 Patient presents today for surgical risk assessment for colonoscopy procedure, date to be determined. Colonoscopy will be done with Dr. Liliane Shi with [facility].  Patient has received okay from cardiology to hold Eliquis 2 days prior.  Patient follows with Dr. Sherene Sires for history of severe COPD and chronic respiratory failure.   She is doing alright today. She had covid last month and was treated with antiviral, did not require hospitalization. Breathing can be exacerbated by heat/humidity or temperature change. She does fine indoors in the [facility]. She only has trouble breathing wise when she leaves the house. She has an occasional productive cough with clear sputum. She is compliant with Anoro Ellipta one puff daily. She uses albuterol 1-2 times a month. CAT score 16   06/21/2023- Interim hx  Patient has severe COPD with high symptom burden. She is here with her daughter (and one on the phone). She reports oxygen level dropping into the 70s on 3 L pulsed, in the past it has been documented that she needed to use 4 L with POC. She no longer feels her Anoro inhaler is working, she does not feel as though she is getting full dose of medication. One of her daughter feels she has heard her mother wheezing. She has an evening cough with yellow mucus. She is scheduled for a colonoscopy next week, she is high risk due to COPD and respiratory failure. She does not feel baseline and we discussed postponing colonoscopy  until follow-up in 2 weeks.   No Known Allergies  Immunization History  Administered Date(s) Administered   Influenza Split 07/24/2011, 06/25/2014, 07/04/2015   Influenza Whole 07/03/2016   Influenza, High Dose Seasonal PF 06/23/2017   Influenza-Unspecified 06/03/2014   Pneumococcal Conjugate-13 07/17/2014   Pneumococcal Polysaccharide-23 06/03/2012    Past Medical History:  Diagnosis Date   Acute on chronic respiratory failure (HCC) 01/17/2016   Chronic bronchitis    COPD (chronic obstructive pulmonary disease) (HCC)    Dysrhythmia    Afib   Hyperlipidemia    Hypertension    Hypothyroidism    Oxygen dependent    2-4 L   Pneumonia    Post-menopausal    Type 2 diabetes mellitus without complication, without long-term current use of insulin (HCC) 05/10/2023    Tobacco History: Social History   Tobacco Use  Smoking Status Former   Current packs/day: 0.00   Average packs/day: 0.5 packs/day for 20.0 years (10.0 ttl pk-yrs)   Types: Cigarettes   Start date: 11/03/1988   Quit date: 11/03/2008   Years since quitting: 14.6  Smokeless Tobacco Never   Counseling given: Not Answered   Outpatient Medications Prior to Visit  Medication Sig Dispense Refill   albuterol (PROVENTIL) (2.5 MG/3ML) 0.083% nebulizer solution Take 3 mLs (2.5 mg total) by nebulization every 2 (two) hours as needed for wheezing or shortness of breath. 75 mL 0   albuterol (VENTOLIN HFA) 108 (90 Base) MCG/ACT inhaler INHALE 1-2 PUFFS BY MOUTH EVERY 6 HOURS AS NEEDED FOR  WHEEZING OR FOR SHORTNESS OF BREATH 8.5 g 2   alendronate (FOSAMAX) 70 MG tablet Take 70 mg by mouth every Monday.  11   ANORO ELLIPTA 62.5-25 MCG/ACT AEPB INHALE 1 PUFF BY MOUTH INTO LUNGS ONCE DAILY 60 each 5   apixaban (ELIQUIS) 5 MG TABS tablet TAKE ONE TABLET BY MOUTH TWICE DAILY Pt overdue for visit, must see MD for further refills 60 tablet 6   atenolol (TENORMIN) 25 MG tablet Take 25 mg by mouth daily.     Cyanocobalamin (VITAMIN B-12  PO) Take 500 mcg by mouth daily.     diclofenac Sodium (VOLTAREN) 1 % GEL Apply 2 g topically 4 (four) times daily. 150 g 0   diltiazem (CARDIZEM CD) 120 MG 24 hr capsule TAKE ONE CAPSULE BY MOUTH daily 30 capsule 11   FEROSUL 325 (65 Fe) MG tablet Take 325 mg by mouth daily.     guaiFENesin (MUCINEX) 600 MG 12 hr tablet Take 600 mg by mouth 2 (two) times daily. 1 Tablet Twice Daily     levothyroxine (SYNTHROID, LEVOTHROID) 75 MCG tablet Take 75 mcg by mouth daily.  3   Magnesium 400 MG CAPS Take 400 mg by mouth daily.     metFORMIN (GLUCOPHAGE) 500 MG tablet Take 500 mg by mouth daily.     mirtazapine (REMERON) 15 MG tablet Take 15 mg by mouth at bedtime.     Multiple Vitamin (MULTIVITAMIN WITH MINERALS) TABS tablet Take 1 tablet by mouth daily.     OXYGEN Pt uses 2lpm with sleep and 4 lpm with exertion  DME- [facility]     polyethylene glycol-electrolytes (NULYTELY) 420 g solution Take 4,000 mLs by mouth once.     rosuvastatin (CRESTOR) 20 MG tablet Take 20 mg by mouth daily.     vitamin C (ASCORBIC ACID) 500 MG tablet Take 500 mg by mouth daily.     vitamin E 180 MG (400 UNITS) capsule Take 400 Units by mouth daily.     No facility-administered medications prior to visit.   Review of Systems  Review of Systems  Constitutional:  Positive for appetite change.  HENT: Negative.    Respiratory:  Positive for cough and shortness of breath. Negative for chest tightness.   Cardiovascular: Negative.    Physical Exam  BP 124/66   Pulse 94   Ht 5' (1.524 m)   Wt 93 lb (42.2 kg)   SpO2 97%   BMI 18.16 kg/m  Physical Exam Constitutional:      Appearance: Normal appearance.  HENT:     Head: Normocephalic and atraumatic.  Cardiovascular:     Rate and Rhythm: Normal rate and regular rhythm.  Pulmonary:     Effort: Pulmonary effort is normal.     Breath sounds: Wheezing present.  Neurological:     General: No focal deficit present.     Mental Status: She is alert and oriented to person,  place, and time. Mental status is at baseline.  Psychiatric:        Mood and Affect: Mood normal.        Behavior: Behavior normal.        Thought Content: Thought content normal.        Judgment: Judgment normal.      Lab Results:  CBC    Component Value Date/Time   WBC 4.4 05/23/2023 1657   RBC 4.31 05/23/2023 1657   HGB 12.5 05/23/2023 1657   HCT 38.8 05/23/2023 1657   PLT 254 05/23/2023  1657   MCV 90.0 05/23/2023 1657   MCV 95.1 10/19/2013 1810   MCH 29.0 05/23/2023 1657   MCHC 32.2 05/23/2023 1657   RDW 13.0 05/23/2023 1657   LYMPHSABS 1.7 05/23/2023 1657   MONOABS 0.5 05/23/2023 1657   EOSABS 0.1 05/23/2023 1657   BASOSABS 0.0 05/23/2023 1657    BMET    Component Value Date/Time   NA 141 05/23/2023 1657   K 5.1 05/23/2023 1657   CL 97 (L) 05/23/2023 1657   CO2 35 (H) 05/23/2023 1657   GLUCOSE 114 (H) 05/23/2023 1657   BUN 11 05/23/2023 1657   CREATININE 0.51 05/23/2023 1657   CALCIUM 10.8 (H) 05/23/2023 1657   GFRNONAA >60 05/23/2023 1657   GFRAA >60 01/21/2016 0510    BNP    Component Value Date/Time   BNP 15.0 05/23/2023 1657    ProBNP No results found for: "PROBNP"  Imaging: DG Chest 2 View  Result Date: 06/21/2023 CLINICAL DATA:  COPD exacerbation. EXAM: CHEST - 2 VIEW COMPARISON:  May 23, 2023. FINDINGS: The heart size and mediastinal contours are within normal limits. Hyperinflation of the lungs is again noted. No consolidative process is noted. The visualized skeletal structures are unremarkable. IMPRESSION: Hyperinflation of the lungs consistent with COPD. No acute pulmonary abnormality is noted. Electronically Signed   By: Lupita Raider M.D.   On: 06/21/2023 12:48   DG Chest Port 1 View  Result Date: 05/23/2023 CLINICAL DATA:  Shortness of breath EXAM: PORTABLE CHEST 1 VIEW COMPARISON:  Chest x-ray dated April 28, 2023 FINDINGS: Cardiac and mediastinal contours within normal limits. Lungs are hyperinflated. Mild bibasilar opacities,  unchanged when compared with the prior likely due to scarring or atelectasis. No consolidation. No evidence of pleural effusion or pneumothorax. IMPRESSION: 1. No consolidation. 2. Hyperinflated lungs, suggestive of COPD. 3. Mild bibasilar opacities, unchanged when compared with the prior likely due to scarring or atelectasis. Electronically Signed   By: Allegra Lai M.D.   On: 05/23/2023 18:20     Assessment & Plan:   COPD with exacerbation [facility]) Patient has severe COPD with high symptom burden.  Symptoms appear somewhat exacerbated. She has a cough with yellow mucus and intermittent wheezing per her daughter. We will get CXR today and treat her with Z-Pak as well changing her from Anoro to Trelegy Ellipta 100 mcg 1 puff daily.  Advise she start taking Mucinex 600 mg twice daily.  Recommend following up in 2 weeks prior to surgical risk assessment for colonoscopy.  Recommendations: - Stop Anoro - Start Trelegy take one puff daily (rinse mouth daily) - Start mucinex 600mg  twice daily (over the counter) - Start zpack as directed  - Make sure to use 4L oxygen at all times   Orders: CXR re: COPD  Nebulizer machine   Follow-up: 2-3 weeks with Waynetta Sandy NP after starting Trelegy   Chronic respiratory failure with hypoxia and hypercapnia (HCC) - Continue 3-4L supplemental oxygen, needs to use 4L with POC to maintain O2 >88-90%   Former cigarette smoker - Originally declined but after speaking today at length she is in agreement with referral to the lung cancer screening program     Glenford Bayley, NP 06/21/2023

## 2023-06-21 NOTE — Assessment & Plan Note (Signed)
-   Continue 3-4L supplemental oxygen, needs to use 4L with POC to maintain O2 >88-90%

## 2023-06-22 ENCOUNTER — Telehealth: Payer: Self-pay | Admitting: Primary Care

## 2023-06-22 DIAGNOSIS — J449 Chronic obstructive pulmonary disease, unspecified: Secondary | ICD-10-CM | POA: Diagnosis not present

## 2023-06-22 DIAGNOSIS — R531 Weakness: Secondary | ICD-10-CM | POA: Diagnosis not present

## 2023-06-22 NOTE — Telephone Encounter (Signed)
Patient was seen.

## 2023-06-22 NOTE — Telephone Encounter (Signed)
Patient's daughter wants to know if her mother should take zpack  while also on a blood thinner. She's also taking an antibiotic and was just wondering if she should be taking it since she has a heart issue and on worphine blood thinner. Please call and advise [phone]

## 2023-06-26 MED ORDER — LEVALBUTEROL HCL 0.63 MG/3ML IN NEBU: 0.6300 mg | INHALATION_SOLUTION | Freq: Four times a day (QID) | RESPIRATORY_TRACT | 2 refills | Status: DC | PRN

## 2023-06-26 MED ORDER — AZITHROMYCIN 250 MG PO TABS: ORAL_TABLET | ORAL | 0 refills | Status: DC

## 2023-06-26 MED ORDER — ALBUTEROL SULFATE (2.5 MG/3ML) 0.083% IN NEBU: 2.5000 mg | INHALATION_SOLUTION | Freq: Four times a day (QID) | RESPIRATORY_TRACT | 12 refills | Status: DC | PRN

## 2023-06-26 NOTE — Telephone Encounter (Addendum)
Called patient.  She has not received any of her medications.  Called patients pharmacy.  All meds were sent to mail order pharmacy [facility].  Per Joselyn Glassman, pharmacist, albuterol MDI was delivered to patients home on 06/22/2023.  Azithromycin and albuterol 0.083% nebulizer solution has not been processed yet.  Joselyn Glassman will cancel these two prescriptions.  Called patient to have her fill these at a local pharmacy so she can start medications today.  Called patient.  Patient wants medications sent to [facility] at [facility] and [address] in [place].  Patient verbalized understanding and will pick up her medications today.  Per Beth:  She called patients daughter and answered all questions regarding any contraindications with medications and patients medical conditions.

## 2023-06-26 NOTE — Telephone Encounter (Addendum)
Change nebulizer to levalbuterol due to hx afib Pharmacy aware

## 2023-06-26 NOTE — Telephone Encounter (Signed)
Daughter is @ [phone]

## 2023-06-26 NOTE — Addendum Note (Signed)
Addended by: Glenford Bayley on: 06/26/2023 05:28 PM   Modules accepted: Orders

## 2023-06-26 NOTE — Telephone Encounter (Signed)
ATC patients daughter Vic Blackbird about patients medications.  Left message to call back when available.

## 2023-06-26 NOTE — Telephone Encounter (Signed)
PT's daughter is calling again concerned for her mom and these medications. (See below.) Triage is very busy and has not gotten bsck to her. She has called since 9/19. Please try to call or have your nurse call, Ms. Clent Ridges. Thank you.

## 2023-06-26 NOTE — Telephone Encounter (Signed)
[name] daughter is checking on message for patient's medication. [name] phone number is [phone].

## 2023-06-27 ENCOUNTER — Telehealth: Payer: Self-pay | Admitting: Primary Care

## 2023-06-27 ENCOUNTER — Ambulatory Visit (HOSPITAL_COMMUNITY): Admission: RE | Admit: 2023-06-27 | Payer: 59 | Source: Home / Self Care | Admitting: Internal Medicine

## 2023-06-27 ENCOUNTER — Telehealth: Payer: Self-pay | Admitting: Cardiovascular Disease

## 2023-06-27 HISTORY — DX: Dependence on supplemental oxygen: Z99.81

## 2023-06-27 HISTORY — DX: Cardiac arrhythmia, unspecified: I49.9

## 2023-06-27 HISTORY — DX: Pneumonia, unspecified organism: J18.9

## 2023-06-27 SURGERY — COLONOSCOPY WITH PROPOFOL
Anesthesia: Monitor Anesthesia Care

## 2023-06-27 NOTE — Telephone Encounter (Signed)
Ok to take azithromycin. Very weak interaction with Eliquis but it isn't clinically significant. Azithromycin can prolong QTc but hers is normal on recent EKG.

## 2023-06-27 NOTE — Telephone Encounter (Signed)
Pt c/o medication issue:  1. Name of Medication:  azithromycin (ZITHROMAX Z-PAK) 250 MG tablet   2. How are you currently taking this medication (dosage and times per day)? Not currently taking  3. Are you having a reaction (difficulty breathing--STAT)? No   4. What is your medication issue? Wants to know if this antibiotic would cause an interact with the patient's eliquis    Please advise.

## 2023-06-27 NOTE — Telephone Encounter (Signed)
[name] daughter states patient does not see anything in her mucus. Sending to [name] Dura NP. Tiwanna phone number is [phone].

## 2023-06-27 NOTE — Telephone Encounter (Signed)
Ok I would hold off on taking azithromycin. Proceed with new inhaler and nebulizer. Notify us if anything worsens or changes

## 2023-06-27 NOTE — Telephone Encounter (Signed)
Left message for patient to return the call.

## 2023-06-28 NOTE — Telephone Encounter (Signed)
Patient's daughter is returning call.

## 2023-06-28 NOTE — Telephone Encounter (Signed)
Cal to patient with information.  LM to call office  Ok to take azithromycin. Very weak interaction with Eliquis but it isn't clinically significant. Azithromycin can prolong QTc but hers is normal on recent EKG.

## 2023-06-28 NOTE — Telephone Encounter (Signed)
Call to daughter. Advised no DPR on file. Spoke with patient while on phone and she states OK to speak with daughter.  Information given and she states understanding.  She will have patient come in to fill out DPR form

## 2023-06-29 NOTE — Telephone Encounter (Signed)
I called and spoke with the pt's daughter and notified of response per Dr. Delton Coombes  She verbalized understanding  Nothing further needed

## 2023-06-30 ENCOUNTER — Telehealth: Payer: Self-pay | Admitting: Primary Care

## 2023-06-30 NOTE — Telephone Encounter (Signed)
PT's daughter calling with mom on the line. Ms. Clent Ridges was to have changed her Anoro to Trelegy but the Trelegy was too expensive. Please call Pt w/an Alternative.  Please call daughter. Mom too slow to get ringing phone: [phone]  AVS States: Instructions  from Glenford Bayley  Recommendations: - Stop Anoro - Start Trelegy take one puff daily (rinse mouth daily) - Start mucinex 600mg  twice daily (over the counter) - Start zpack as directed

## 2023-07-05 NOTE — Telephone Encounter (Signed)
What triple therapy is formulary on patient plan?  Or ICS that we can add to [facility]

## 2023-07-06 ENCOUNTER — Other Ambulatory Visit (HOSPITAL_COMMUNITY): Payer: Self-pay

## 2023-07-06 NOTE — Telephone Encounter (Signed)
The following are the results of test claims for triple therapy  Breztri - $0.00 Trelegy - $0.00

## 2023-07-10 NOTE — Telephone Encounter (Signed)
I started her on Telegy, looks like its zero dollar copay. If inhaler is working well please make sure she has script sent to pharmacy

## 2023-07-11 DIAGNOSIS — J449 Chronic obstructive pulmonary disease, unspecified: Secondary | ICD-10-CM | POA: Diagnosis not present

## 2023-07-12 NOTE — Telephone Encounter (Signed)
[name] daughter is returning phone call. [name] phone number is [phone].

## 2023-07-12 NOTE — Telephone Encounter (Signed)
Note I started her on Telegy, looks like its zero dollar copay. If inhaler is working well please make sure she has script sent to pharmacy      [name], I have spoken with the pt and she would like to continue on trelegy as it has helped her SOB  I was going to send rx but the strength of med never documented and pt was unsure  Please advise trelegy 100 or 200, thanks!

## 2023-07-14 NOTE — Telephone Encounter (Signed)
Sorry, I assumed it was on her med list because of the sample but doesn't appear to be  Please send in RX Trelegy one puff daily  Discontinue Anoro

## 2023-07-15 MED ORDER — TRELEGY ELLIPTA 100-62.5-25 MCG/ACT IN AEPB: 1.0000 | INHALATION_SPRAY | Freq: Every day | RESPIRATORY_TRACT | 4 refills | Status: DC

## 2023-07-15 NOTE — Telephone Encounter (Signed)
Trelegy Rx sent to pharmacy for pt and Anoro discontinued.

## 2023-08-03 ENCOUNTER — Other Ambulatory Visit: Payer: Self-pay | Admitting: Internal Medicine

## 2023-08-07 NOTE — Progress Notes (Unsigned)
@Patient  ID: [name], female    DOB: Feb 26, 1951, 72 y.o.   MRN: [id number]  No chief complaint on file.   Referring provider: Renford Dills, MD  HPI: 72 year old female, former smoker.  Past medical history significant for hypertension, A-fib, COPD Gold 3, chronic respiratory failure with hypoxia, hypothyroidism, hyperlipidemia.   Previous [facility] pulmonary encounter:  04/28/2023 Patient presents today for surgical risk assessment for colonoscopy procedure, date to be determined. Colonoscopy will be done with Dr. Liliane Shi with [facility].  Patient has received okay from cardiology to hold Eliquis 2 days prior.  Patient follows with Dr. Sherene Sires for history of severe COPD and chronic respiratory failure.   She is doing alright today. She had covid last month and was treated with antiviral, did not require hospitalization. Breathing can be exacerbated by heat/humidity or temperature change. She does fine indoors in the [facility]. She only has trouble breathing wise when she leaves the house. She has an occasional productive cough with clear sputum. She is compliant with Anoro Ellipta one puff daily. She uses albuterol 1-2 times a month. CAT score 16  06/21/2023 Patient has severe COPD with high symptom burden. She is here with her daughter (and one on the phone). She reports oxygen level dropping into the 70s on 3 L pulsed, in the past it has been documented that she needed to use 4 L with POC. She no longer feels her Anoro inhaler is working, she does not feel as though she is getting full dose of medication. One of her daughter feels she has heard her mother wheezing. She has an evening cough with yellow mucus. She is scheduled for a colonoscopy next week, she is high risk due to COPD and respiratory failure. She does not feel baseline and we discussed postponing colonoscopy until follow-up in 2 weeks.   COPD with exacerbation [facility]) Patient has severe COPD with high symptom burden.  Symptoms appear  somewhat exacerbated. She has a cough with yellow mucus and intermittent wheezing per her daughter. We will get CXR today and treat her with Z-Pak as well changing her from Anoro to Trelegy Ellipta 100 mcg 1 puff daily.  Advise she start taking Mucinex 600 mg twice daily.  Recommend following up in 2 weeks prior to surgical risk assessment for colonoscopy.   Recommendations: - Stop Anoro - Start Trelegy take one puff daily (rinse mouth daily) - Start mucinex 600mg  twice daily (over the counter) - Start zpack as directed  - Make sure to use 4L oxygen at all times  - Order nebulizer machine      Chronic respiratory failure with hypoxia and hypercapnia (HCC) - Continue 3-4L supplemental oxygen, needs to use 4L with POC to maintain O2 >88-90%    Former cigarette smoker - Originally declined but after speaking today at length she is in agreement with referral to the lung cancer screening program  08/08/2023- interim hx  Patient presents today for follow-up COPD/ surgical risk assessment for colonoscopy . She was seen in September for COPD exacerbation, treated with Z-Pak.  Changed from Anoro to Trelegy. Changed nebulizer to levalbuterol due to afib. CXR showed hyperinflation without acute abnormality.  Continue supplemental oxygen 3-4L Referred to lung cancer screening program   Discussed the use of AI scribe software for clinical note transcription with the patient, who gave verbal consent to proceed.  History of Present Illness   The patient, with a history of COPD, presents for a follow-up visit after a recent exacerbation. She  reports that her oxygen levels have been within a normal range on three liters of oxygen, attributing this improvement to a recent change in medication. The patient was switched from Anoro to [facility] and was also given an antibiotic, azithromycin, to manage the exacerbation. She reports feeling better over the past two weeks, noting a significant improvement in  her breathing since starting the new inhaler.   However, the patient has been experiencing episodes of shortness of breath, particularly when dressing herself. She attributes this to a lack of sleep/fatigue. She often goes to bed at four or five in the morning and wakes up at eight for church. The patient also reports taking sleeping pills, which have not been helping with her sleep issues and may be contributing to daytime sleepiness and fatigue.   The patient has a history of smoking, starting in her mid-twenties and quitting in 2010. She reports that a pack of cigarettes would last her about three days. She has been referred for a lung cancer screening but has yet to follow up on this.     No Known Allergies  Immunization History  Administered Date(s) Administered   Influenza Split 07/24/2011, 06/25/2014, 07/04/2015   Influenza Whole 07/03/2016   Influenza, High Dose Seasonal PF 06/23/2017   Influenza-Unspecified 06/03/2014   Pneumococcal Conjugate-13 07/17/2014   Pneumococcal Polysaccharide-23 06/03/2012    Past Medical History:  Diagnosis Date   Acute on chronic respiratory failure (HCC) 01/17/2016   Chronic bronchitis    COPD (chronic obstructive pulmonary disease) (HCC)    Dysrhythmia    Afib   Hyperlipidemia    Hypertension    Hypothyroidism    Oxygen dependent    2-4 L   Pneumonia    Post-menopausal    Type 2 diabetes mellitus without complication, without long-term current use of insulin (HCC) 05/10/2023    Tobacco History: Social History   Tobacco Use  Smoking Status Former   Current packs/day: 0.00   Average packs/day: 0.5 packs/day for 20.0 years (10.0 ttl pk-yrs)   Types: Cigarettes   Start date: 11/03/1988   Quit date: 11/03/2008   Years since quitting: 14.7  Smokeless Tobacco Never   Counseling given: Not Answered   Outpatient Medications Prior to Visit  Medication Sig Dispense Refill   albuterol (VENTOLIN HFA) 108 (90 Base) MCG/ACT inhaler INHALE 1-2  PUFFS BY MOUTH EVERY 6 HOURS AS NEEDED FOR WHEEZING OR FOR SHORTNESS OF BREATH 8.5 g 2   alendronate (FOSAMAX) 70 MG tablet Take 70 mg by mouth every Monday.  11   apixaban (ELIQUIS) 5 MG TABS tablet TAKE ONE TABLET BY MOUTH TWICE DAILY Pt overdue for visit, must see MD for further refills 60 tablet 6   atenolol (TENORMIN) 25 MG tablet Take 25 mg by mouth daily.     azithromycin (ZITHROMAX Z-PAK) 250 MG tablet Take Zpack as directed 6 tablet 0   Cyanocobalamin (VITAMIN B-12 PO) Take 500 mcg by mouth daily.     diclofenac Sodium (VOLTAREN) 1 % GEL Apply 2 g topically 4 (four) times daily. 150 g 0   diltiazem (CARDIZEM CD) 120 MG 24 hr capsule TAKE ONE CAPSULE BY MOUTH daily 30 capsule 11   FEROSUL 325 (65 Fe) MG tablet Take 325 mg by mouth daily.     Fluticasone-Umeclidin-Vilant (TRELEGY ELLIPTA) 100-62.5-25 MCG/ACT AEPB Inhale 1 puff into the lungs daily. 60 each 4   guaiFENesin (MUCINEX) 600 MG 12 hr tablet Take 600 mg by mouth 2 (two) times daily. 1 Tablet  Twice Daily     levalbuterol (XOPENEX) 0.63 MG/3ML nebulizer solution Take 3 mLs (0.63 mg total) by nebulization every 6 (six) hours as needed for wheezing or shortness of breath. 90 mL 2   levothyroxine (SYNTHROID, LEVOTHROID) 75 MCG tablet Take 75 mcg by mouth daily.  3   Magnesium 400 MG CAPS Take 400 mg by mouth daily.     metFORMIN (GLUCOPHAGE) 500 MG tablet Take 500 mg by mouth daily.     mirtazapine (REMERON) 15 MG tablet Take 15 mg by mouth at bedtime.     Multiple Vitamin (MULTIVITAMIN WITH MINERALS) TABS tablet Take 1 tablet by mouth daily.     OXYGEN Pt uses 2lpm with sleep and 4 lpm with exertion  DME- [facility]     polyethylene glycol-electrolytes (NULYTELY) 420 g solution Take 4,000 mLs by mouth once.     rosuvastatin (CRESTOR) 20 MG tablet Take 20 mg by mouth daily.     vitamin C (ASCORBIC ACID) 500 MG tablet Take 500 mg by mouth daily.     vitamin E 180 MG (400 UNITS) capsule Take 400 Units by mouth daily.     No  facility-administered medications prior to visit.   Review of Systems  Review of Systems  Constitutional:  Positive for fatigue.  Respiratory:  Negative for cough, shortness of breath and wheezing.     Physical Exam  There were no vitals taken for this visit. Physical Exam Constitutional:      Appearance: Normal appearance.  HENT:     Head: Normocephalic and atraumatic.     Mouth/Throat:     Mouth: Mucous membranes are moist.     Pharynx: Oropharynx is clear.  Cardiovascular:     Rate and Rhythm: Normal rate and regular rhythm.  Pulmonary:     Effort: Pulmonary effort is normal.     Breath sounds: Normal breath sounds. No wheezing or rhonchi.     Comments: 3L POC Musculoskeletal:        General: Normal range of motion.  Skin:    General: Skin is warm and dry.  Neurological:     General: No focal deficit present.     Mental Status: She is alert and oriented to person, place, and time. Mental status is at baseline.  Psychiatric:        Mood and Affect: Mood normal.        Behavior: Behavior normal.        Thought Content: Thought content normal.        Judgment: Judgment normal.      Lab Results:  CBC    Component Value Date/Time   WBC 4.4 05/23/2023 1657   RBC 4.31 05/23/2023 1657   HGB 12.5 05/23/2023 1657   HCT 38.8 05/23/2023 1657   PLT 254 05/23/2023 1657   MCV 90.0 05/23/2023 1657   MCV 95.1 10/19/2013 1810   MCH 29.0 05/23/2023 1657   MCHC 32.2 05/23/2023 1657   RDW 13.0 05/23/2023 1657   LYMPHSABS 1.7 05/23/2023 1657   MONOABS 0.5 05/23/2023 1657   EOSABS 0.1 05/23/2023 1657   BASOSABS 0.0 05/23/2023 1657    BMET    Component Value Date/Time   NA 141 05/23/2023 1657   K 5.1 05/23/2023 1657   CL 97 (L) 05/23/2023 1657   CO2 35 (H) 05/23/2023 1657   GLUCOSE 114 (H) 05/23/2023 1657   BUN 11 05/23/2023 1657   CREATININE 0.51 05/23/2023 1657   CALCIUM 10.8 (H) 05/23/2023 1657   GFRNONAA >  60 05/23/2023 1657   GFRAA >60 01/21/2016 0510     BNP    Component Value Date/Time   BNP 15.0 05/23/2023 1657    ProBNP No results found for: "PROBNP"  Imaging: No results found.   Assessment & Plan:   1. Former cigarette smoker - Ambulatory Referral for Lung Cancer Scre  2. Chronic respiratory failure with hypoxia (HCC) - AMB referral to pulmonary rehabilitation  3. COPD GOLD III criteria but 02 dep - AMB referral to pulmonary rehabilitation  4. SOB (shortness of breath) on exertion  5. Immunization due - Flu Vaccine Trivalent High Dose (Fluad)   Chronic Obstructive Pulmonary Disease (COPD) Improvement in symptoms after changing inhaler from Anoro to Trelegy Ellipta and treating a recent exacerbation with azithromycin. Patient reports better tolerance of activities and improved oxygen saturation on 3 liters. Chest x-ray showed hyperinflation consistent with COPD but no acute abnormalities. -Continue Trelegy Ellipta one puff daily  -Use nebulizer as needed for shortness of breath. -Continue Mucinex 600mg  twice a day as needed. -Refer to pulmonary rehab for regular exercise and conditioning.  Chronic respiratory failure - Stable; Continue 3-4L supplemental oxygen to maintain O2 >88-90%  Insomnia Patient reports difficulty sleeping and staying up late into the night, which may be contributing to daytime fatigue and exacerbating respiratory symptoms. -Discuss sleep hygiene and potential medication adjustments with primary care provider.  Lung Cancer Screening Patient has a history of smoking and last CT scan was in 2017. Unclear if patient qualifies for lung cancer screening program based on pack-year history and time since quitting. -Attempt to get patient into lung cancer screening program for a one-time CT scan. If not eligible, discuss ordering a regular CT scan.  General Health Maintenance -Administer influenza vaccine today.      Glenford Bayley, NP 08/07/2023

## 2023-08-08 ENCOUNTER — Telehealth (HOSPITAL_COMMUNITY): Payer: Self-pay

## 2023-08-08 ENCOUNTER — Encounter: Payer: Self-pay | Admitting: Primary Care

## 2023-08-08 ENCOUNTER — Ambulatory Visit (INDEPENDENT_AMBULATORY_CARE_PROVIDER_SITE_OTHER): Payer: 59 | Admitting: Primary Care

## 2023-08-08 VITALS — BP 130/70 | HR 91 | Ht 60.0 in | Wt 93.0 lb

## 2023-08-08 DIAGNOSIS — Z23 Encounter for immunization: Secondary | ICD-10-CM | POA: Diagnosis not present

## 2023-08-08 DIAGNOSIS — J449 Chronic obstructive pulmonary disease, unspecified: Secondary | ICD-10-CM | POA: Diagnosis not present

## 2023-08-08 DIAGNOSIS — Z87891 Personal history of nicotine dependence: Secondary | ICD-10-CM | POA: Diagnosis not present

## 2023-08-08 DIAGNOSIS — J9611 Chronic respiratory failure with hypoxia: Secondary | ICD-10-CM | POA: Diagnosis not present

## 2023-08-08 DIAGNOSIS — R0602 Shortness of breath: Secondary | ICD-10-CM

## 2023-08-08 MED ORDER — TRELEGY ELLIPTA 100-62.5-25 MCG/ACT IN AEPB: 1.0000 | INHALATION_SPRAY | Freq: Every day | RESPIRATORY_TRACT | 4 refills | Status: DC

## 2023-08-08 MED ORDER — LEVALBUTEROL HCL 0.63 MG/3ML IN NEBU: 0.6300 mg | INHALATION_SOLUTION | Freq: Four times a day (QID) | RESPIRATORY_TRACT | 2 refills | Status: AC | PRN

## 2023-08-08 NOTE — Patient Instructions (Addendum)
Recommendations: - Continue Trelegy one puff daily in the morning - Use Nebulizer every 6 hours AS NEEDED for shortness of breath - Continue oxygen 3-4L to maintain O2 level >88%  - Referring you back to pulmonary rehab - Look at your sleep schedule, take sleep aid 30-60 min before going to sleep. If still not helping address with primary care  - You may not be the right candidate for lung cancer screening, but I did refer you so that if you are you can get one scan covered by your insurance   Referral: Pulmonary rehab re: COPD stage 3   Follow-up 6 months with Dr. Sherene Sires

## 2023-08-08 NOTE — Telephone Encounter (Signed)
Called patient to see if she is interested in the Pulmonary Rehab Program. Patient expressed interest. Explained scheduling process and went over insurance, patient verbalized understanding. Someone from our pulmonary rehab staff will contact pt at a later time.

## 2023-08-08 NOTE — Telephone Encounter (Signed)
Pt insurance is active and benefits verified through [facility] Dual Comp Co-pay 0, DED 240/240 met, out of pocket 1850/733.83 met, co-insurance 20. no pre-authorization required. [facility] T/[employer] 08/08/2023@303  REF# [id number]

## 2023-08-08 NOTE — Telephone Encounter (Signed)
Office referral recv'ed, printed and given to RN for review. 

## 2023-08-09 ENCOUNTER — Telehealth: Payer: Self-pay | Admitting: Primary Care

## 2023-08-09 NOTE — Telephone Encounter (Signed)
Patient needs a written statement that discusses her condition in order for her to no longer have to participate in Jury Duty for good.  Call back number [phone]

## 2023-08-11 DIAGNOSIS — J449 Chronic obstructive pulmonary disease, unspecified: Secondary | ICD-10-CM | POA: Diagnosis not present

## 2023-08-16 ENCOUNTER — Encounter (HOSPITAL_COMMUNITY): Payer: Self-pay

## 2023-08-16 ENCOUNTER — Telehealth (HOSPITAL_COMMUNITY): Payer: Self-pay

## 2023-08-16 NOTE — Telephone Encounter (Signed)
Attempted to call patient in regards to Pulmonary Rehab - LM on VM Mailed letter 

## 2023-08-18 NOTE — Telephone Encounter (Signed)
Ok to provide lettering recommending she be excused from jury due to her underlying medical conditions consisting of COPD and chronic respiratory failure on oxygen

## 2023-08-18 NOTE — Telephone Encounter (Signed)
Spoke with patient regarding prior message . Advised patient I was going to type up a letter per [facility] [facility] and I will mail it to patient to excuse her from jury duty. Patient's voice was understanding.Nothing else further needed.

## 2023-08-21 ENCOUNTER — Telehealth (HOSPITAL_COMMUNITY): Payer: Self-pay

## 2023-08-21 NOTE — Telephone Encounter (Signed)
Pt called in regards to her Pulmonary rehab referral. At this time she is not interested in participating. She stated " I can manage it on my own by going to a gym." Closing referral for now!

## 2023-08-24 ENCOUNTER — Encounter: Payer: Self-pay | Admitting: *Deleted

## 2023-09-10 DIAGNOSIS — J449 Chronic obstructive pulmonary disease, unspecified: Secondary | ICD-10-CM | POA: Diagnosis not present

## 2023-09-13 NOTE — Telephone Encounter (Signed)
Lanna Poche, CMA      08/18/23  1:29 PM Note Spoke with patient regarding prior message . Advised patient I was going to type up a letter per [facility] and I will mail it to patient to excuse her from jury duty. Patient's voice was understanding.Nothing else further needed.

## 2023-09-20 ENCOUNTER — Telehealth: Payer: Self-pay | Admitting: Primary Care

## 2023-09-20 NOTE — Telephone Encounter (Signed)
PT's daughter presented to the front with a letter from [employer] "Failure to apper" We did a letter asking she be excused from duty on 11/12 and it was not recorded or rec'd on time. She asked that I send a copy of the letter by email explaining that a letter was sent. I sent by email thru the fax machine and I will try to send via my email as well. NFN

## 2023-10-11 DIAGNOSIS — J449 Chronic obstructive pulmonary disease, unspecified: Secondary | ICD-10-CM | POA: Diagnosis not present

## 2023-10-25 ENCOUNTER — Other Ambulatory Visit: Payer: Self-pay | Admitting: Internal Medicine

## 2023-10-25 DIAGNOSIS — E039 Hypothyroidism, unspecified: Secondary | ICD-10-CM | POA: Diagnosis not present

## 2023-10-25 DIAGNOSIS — E1169 Type 2 diabetes mellitus with other specified complication: Secondary | ICD-10-CM | POA: Diagnosis not present

## 2023-10-25 DIAGNOSIS — E7800 Pure hypercholesterolemia, unspecified: Secondary | ICD-10-CM | POA: Diagnosis not present

## 2023-10-25 DIAGNOSIS — D6869 Other thrombophilia: Secondary | ICD-10-CM | POA: Diagnosis not present

## 2023-10-25 DIAGNOSIS — Z1231 Encounter for screening mammogram for malignant neoplasm of breast: Secondary | ICD-10-CM

## 2023-10-25 DIAGNOSIS — I5189 Other ill-defined heart diseases: Secondary | ICD-10-CM | POA: Diagnosis not present

## 2023-10-25 DIAGNOSIS — I4892 Unspecified atrial flutter: Secondary | ICD-10-CM | POA: Diagnosis not present

## 2023-10-25 DIAGNOSIS — Z23 Encounter for immunization: Secondary | ICD-10-CM | POA: Diagnosis not present

## 2023-10-25 DIAGNOSIS — E46 Unspecified protein-calorie malnutrition: Secondary | ICD-10-CM | POA: Diagnosis not present

## 2023-10-25 DIAGNOSIS — D649 Anemia, unspecified: Secondary | ICD-10-CM | POA: Diagnosis not present

## 2023-10-25 DIAGNOSIS — J449 Chronic obstructive pulmonary disease, unspecified: Secondary | ICD-10-CM | POA: Diagnosis not present

## 2023-10-25 DIAGNOSIS — M810 Age-related osteoporosis without current pathological fracture: Secondary | ICD-10-CM | POA: Diagnosis not present

## 2023-10-25 DIAGNOSIS — I2781 Cor pulmonale (chronic): Secondary | ICD-10-CM | POA: Diagnosis not present

## 2023-10-25 DIAGNOSIS — I700 Atherosclerosis of aorta: Secondary | ICD-10-CM | POA: Diagnosis not present

## 2023-11-11 DIAGNOSIS — J449 Chronic obstructive pulmonary disease, unspecified: Secondary | ICD-10-CM | POA: Diagnosis not present

## 2023-11-15 ENCOUNTER — Ambulatory Visit: Payer: 59

## 2023-11-30 ENCOUNTER — Ambulatory Visit
Admission: RE | Admit: 2023-11-30 | Discharge: 2023-11-30 | Disposition: A | Discharge status: Home / Self Care | Payer: 59 | Source: Ambulatory Visit | Attending: Internal Medicine | Admitting: Internal Medicine

## 2023-11-30 DIAGNOSIS — Z1231 Encounter for screening mammogram for malignant neoplasm of breast: Secondary | ICD-10-CM | POA: Diagnosis not present

## 2023-12-09 DIAGNOSIS — J449 Chronic obstructive pulmonary disease, unspecified: Secondary | ICD-10-CM | POA: Diagnosis not present

## 2023-12-14 ENCOUNTER — Other Ambulatory Visit: Payer: Self-pay | Admitting: Primary Care

## 2024-01-09 DIAGNOSIS — J449 Chronic obstructive pulmonary disease, unspecified: Secondary | ICD-10-CM | POA: Diagnosis not present

## 2024-02-08 DIAGNOSIS — J449 Chronic obstructive pulmonary disease, unspecified: Secondary | ICD-10-CM | POA: Diagnosis not present

## 2024-03-10 DIAGNOSIS — J449 Chronic obstructive pulmonary disease, unspecified: Secondary | ICD-10-CM | POA: Diagnosis not present

## 2024-04-09 DIAGNOSIS — J449 Chronic obstructive pulmonary disease, unspecified: Secondary | ICD-10-CM | POA: Diagnosis not present

## 2024-05-10 DIAGNOSIS — J449 Chronic obstructive pulmonary disease, unspecified: Secondary | ICD-10-CM | POA: Diagnosis not present

## 2024-06-03 NOTE — Progress Notes (Addendum)
 Subjective:   [name], female    DOB: 12/28/1950   MRN: [id number]      Brief patient profile:  19  yobf  Quit smoking 2010  with  GOLD III criteria copd / 0 2 dep    Significant tests/ events  She was seen by Dr Corrie 12/24/08 after hospitalization for LUL pna. CT angio 3/10 showed centrilobular emphysema.She was placed on oxygen  around this time ([facility] pt) .  PFTs  12/2008 FEv1 39%, DLCO 35%, mild air trapping    History of Present Illness  08/28/2015 acute extended ov/Ravindra Baranek re: GOLD III copd/ on spiriva  and freq saba and 02 2lpm hs/ 3lpm with activity  Chief Complaint  Patient presents with   Acute Visit    Pt c/o increased SOB for the past few wks- I have attacks- relates to the weather change. She takes albuterol  inhaler 2 x per day on average and uses neb 1-2 x per day.   baseline doe = MMRC2 = can't walk a nl pace on a flat grade s sob  >>Stiolto rx       03/31/2016 acute extended ov/Saban Heinlen re: aecopd on anoro req by insurance   2lpm rest/3lpm  Chief Complaint  Patient presents with   Acute Visit    Pt c/o increased DOE for the past 1-2 wks. She has been using neb at least once per day.  She also c/o facial puffiness and HA I have had a head cold. She has had minimal cough with white sputum.     easily confused with details of care/ overusing neb and not using saba hfa   Not clear what the alternatives to anoro are. Doe = MMRC3 = can't walk 100 yards even at a slow pace at a flat grade s stopping due to sob  On 3lpm and uses w/c usually to go out.  rec Plan A = Automatic = Stiolto 2 each am  Plan B = Backup Only use your albuterol  as a rescue medication  Plan C = Crisis - only use your albuterol  nebulizer if you first try Plan B and it fails to help > ok to use the nebulizer up to every 4 hours but if start needing it regularly call for immediate appointment Plan D = Doctor - call me if B and C not adequate Plan E = ER - go to ER or  call 911 if all else fails   Prednisone  10 mg take  4 each am x 2 days,   2 each am x 2 days,  1 each am x 2 days and stop      NP rec 08/08/23 - Continue Trelegy one puff daily in the morning - Use Nebulizer every 6 hours AS NEEDED for shortness of breath - Continue oxygen  3-4L to maintain O2 level >88%  - Referring you back to pulmonary rehab > did not go as of 06/05/2024  - Look at your sleep schedule, take sleep aid 30-60 min before going to sleep. If still not helping address with primary care  - You may not be the right candidate for lung cancer screening, but I did refer you so that if you are you can get one scan covered by your insurance     06/05/2024  f/u ov/[name] re: GOLD 3  copd/02 dep    maint on trelegy   Chief Complaint  Patient presents with   Follow-up    Doing well today.   Dyspnea:  stationery bike 3 x weekly  x 30 min  Cough: none  Sleeping: flat be and 2 pillows s  resp cc  SABA use: none lately  02: 3-4L to maintain O2 level  titrate flow to >88%  Lung cancer screening :  declined to pursue it     No obvious day to day or daytime variability or assoc excess/ purulent sputum or mucus plugs or hemoptysis or cp or chest tightness, subjective wheeze or overt sinus or hb symptoms.    Also denies any obvious fluctuation of symptoms with weather or environmental changes or other aggravating or alleviating factors except as outlined above   No unusual exposure hx or h/o childhood pna/ asthma or knowledge of premature birth.  Current Allergies, Complete Past Medical History, Past Surgical History, Family History, and Social History were reviewed in [employer] record.  ROS  The following are not active complaints unless bolded Hoarseness, sore throat, dysphagia, dental problems, itching, sneezing,  nasal congestion or discharge of excess mucus or purulent secretions, ear ache,   fever, chills, sweats, unintended wt loss or wt gain, classically  pleuritic or exertional cp,  orthopnea pnd or arm/hand swelling  or leg swelling, presyncope, palpitations, abdominal pain, anorexia, nausea, vomiting, diarrhea  or change in bowel habits or change in bladder habits, change in stools or change in urine, dysuria, hematuria,  rash, arthralgias, visual complaints, headache, numbness, weakness or ataxia or problems with walking or coordination,  change in mood or  memory.        Current Meds  Medication Sig   albuterol  (VENTOLIN  HFA) 108 (90 Base) MCG/ACT inhaler INHALE 1-2 PUFFS BY MOUTH EVERY 6 HOURS AS NEEDED FOR WHEEZING OR FOR SHORTNESS OF BREATH   alendronate (FOSAMAX) 70 MG tablet Take 70 mg by mouth every Monday.   apixaban  (ELIQUIS ) 5 MG TABS tablet TAKE ONE TABLET BY MOUTH TWICE DAILY Pt overdue for visit, must see MD for further refills   atenolol  (TENORMIN ) 25 MG tablet Take 25 mg by mouth daily.   Cyanocobalamin (VITAMIN B-12 PO) Take 500 mcg by mouth daily.   diclofenac  Sodium (VOLTAREN ) 1 % GEL Apply 2 g topically 4 (four) times daily.   diltiazem  (CARDIZEM  CD) 120 MG 24 hr capsule TAKE ONE CAPSULE BY MOUTH daily   FEROSUL 325 (65 Fe) MG tablet Take 325 mg by mouth daily.   guaiFENesin  (MUCINEX ) 600 MG 12 hr tablet Take 600 mg by mouth 2 (two) times daily. 1 Tablet Twice Daily   levalbuterol  (XOPENEX ) 0.63 MG/3ML nebulizer solution Take 3 mLs (0.63 mg total) by nebulization every 6 (six) hours as needed for wheezing or shortness of breath.   levothyroxine  (SYNTHROID , LEVOTHROID) 75 MCG tablet Take 75 mcg by mouth daily.   Magnesium  400 MG CAPS Take 400 mg by mouth daily.   metFORMIN (GLUCOPHAGE) 500 MG tablet Take 500 mg by mouth daily.   mirtazapine  (REMERON ) 15 MG tablet Take 15 mg by mouth at bedtime.   Multiple Vitamin (MULTIVITAMIN WITH MINERALS) TABS tablet Take 1 tablet by mouth daily.   OXYGEN  Pt uses 2lpm with sleep and 4 lpm with exertion  DME- [facility]   polyethylene glycol-electrolytes (NULYTELY) 420 g solution Take 4,000 mLs  by mouth once.   rosuvastatin  (CRESTOR ) 20 MG tablet Take 20 mg by mouth daily.   TRELEGY ELLIPTA  100-62.5-25 MCG/ACT AEPB INHALE 1 PUFF INTO THE LUNGS DAILY   vitamin C (ASCORBIC ACID) 500 MG tablet Take 500 mg by mouth daily.  vitamin E 180 MG (400 UNITS) capsule Take 400 Units by mouth daily.            Objective:   Physical Exam   wts   06/05/2024           93  08/01/2022       97 07/26/2021      102 06/02/2020        105 05/09/2019          106  10/04/2018         107  03/26/2018       110  09/13/2017     111  04/25/2017      107 10/26/2016      105  04/13/2016       98   03/31/16 97 lb (43.999 kg)  02/08/16 94 lb (42.638 kg)  01/22/16 92 lb 3.2 oz (41.822 kg)    Vital signs reviewed  06/05/2024  - Note at rest 02 sats  93% on 3lpm    General appearance:    pleasant elderly bf nad    HEENT :  Oropharynx  clear   Nasal turbinates nl    NECK :  without JVD/Nodes/TM/ nl carotid upstrokes bilaterally   LUNGS: no acc muscle use,  Mod barrel  contour chest wall with bilateral  Distant bs s audible wheeze and  without cough on insp or exp maneuvers and mod  Hyperresonant  to  percussion bilaterally     CV:  RRR  no s3 or murmur or increase in P2, and no edema   ABD:  soft and nontender with pos mid insp Hoover's  in the supine position. No bruits or organomegaly appreciated, bowel sounds nl  MS:   Ext warm without deformities or   obvious joint restrictions , calf tenderness, cyanosis or clubbing  SKIN: warm and dry without lesions    NEURO:  alert, approp, nl sensorium with  no motor or cerebellar deficits apparent.             Assessment & Plan:   Assessment & Plan COPD GOLD III criteria but 02 dep Quit smoking 2010 PFTs  12/2008 FEv1 39%, DLCO 35%, mild air trapping  - 04/25/2017  After extensive coaching HFA effectiveness =    75% (short Ti) - Referred 04/25/2017 rehab - 06/10/2017 insurance won't cover stiolto > changed to bevespi  2bid but preferred anoro> trelegy   - 06/05/2024  After extensive coaching inhaler device,  effectiveness =    75%  with DPI /elipta   Group D (now reclassified as E) in terms of symptom/risk and laba/lama/ICS  therefore appropriate rx at this point >>>  continue trelegy and approp saba    Chronic respiratory failure with hypoxia and hypercapnia (HCC) rx 2lpm rest/ 4lpm pulsed with activity as of 05/09/2019   - HCO3 09/12/2019 =  34  - HC03   04/10/20        =  38  - POC qualification  06/02/2020  Ok on RA at rest, drops on 250 ft to 88 corrected to 93% on POC  - HC03 11/13/20   = 37   - HC03 05/23/23  = 35   Advised:  goal of 02 rx is low 90s  - see avs for instructions unique to this ov    Each maintenance medication was reviewed in detail including emphasizing most importantly the difference between maintenance and prns and under what circumstances the prns are to be triggered using an  action plan format where appropriate.  Total time for H and P, chart review, counseling, reviewing dpi/hfa/neb/02  device(s) and generating customized AVS unique to this office visit / same day charting = 30 min          AVS  Patient Instructions  Make sure you check your oxygen  saturation  AT  your highest level of activity (not after you stop)   to be sure it stays over 90% and adjust  02 flow upward to maintain this level if needed but remember to turn it back to previous settings when you stop (to conserve your supply).    Please schedule a follow up visit in 12 months but call sooner if needed    Ozell America, MD 06/08/2024

## 2024-06-05 ENCOUNTER — Encounter: Payer: Self-pay | Admitting: Internal Medicine

## 2024-06-05 ENCOUNTER — Ambulatory Visit (INDEPENDENT_AMBULATORY_CARE_PROVIDER_SITE_OTHER): Admitting: Internal Medicine

## 2024-06-05 VITALS — BP 130/74 | HR 91 | Temp 98.2°F | Ht 61.5 in | Wt 93.2 lb

## 2024-06-05 DIAGNOSIS — J9611 Chronic respiratory failure with hypoxia: Secondary | ICD-10-CM | POA: Diagnosis not present

## 2024-06-05 DIAGNOSIS — J9612 Chronic respiratory failure with hypercapnia: Secondary | ICD-10-CM | POA: Diagnosis not present

## 2024-06-05 DIAGNOSIS — J449 Chronic obstructive pulmonary disease, unspecified: Secondary | ICD-10-CM | POA: Diagnosis not present

## 2024-06-05 NOTE — Patient Instructions (Signed)
Make sure you check your oxygen saturation  AT  your highest level of activity (not after you stop)   to be sure it stays over 90% and adjust  02 flow upward to maintain this level if needed but remember to turn it back to previous settings when you stop (to conserve your supply).  ° ° °Please schedule a follow up visit in 12 months but call sooner if needed  °

## 2024-06-08 NOTE — Assessment & Plan Note (Addendum)
 Quit smoking 2010 PFTs  12/2008 FEv1 39%, DLCO 35%, mild air trapping  - 04/25/2017  After extensive coaching HFA effectiveness =    75% (short Ti) - Referred 04/25/2017 rehab - 06/10/2017 insurance won't cover stiolto > changed to bevespi  2bid but preferred anoro> trelegy  - 06/05/2024  After extensive coaching inhaler device,  effectiveness =    75%  with DPI /elipta   Group D (now reclassified as E) in terms of symptom/risk and laba/lama/ICS  therefore appropriate rx at this point >>>  continue trelegy and approp saba

## 2024-06-08 NOTE — Assessment & Plan Note (Addendum)
 rx 2lpm rest/ 4lpm pulsed with activity as of 05/09/2019   - HCO3 09/12/2019 =  34  - HC03   04/10/20        =  38  - POC qualification  06/02/2020  Ok on RA at rest, drops on 250 ft to 88 corrected to 93% on POC  - HC03 11/13/20   = 37   - HC03 05/23/23  = 35   Advised:  goal of 02 rx is low 90s  - see avs for instructions unique to this ov    Each maintenance medication was reviewed in detail including emphasizing most importantly the difference between maintenance and prns and under what circumstances the prns are to be triggered using an action plan format where appropriate.  Total time for H and P, chart review, counseling, reviewing dpi/hfa/neb/02  device(s) and generating customized AVS unique to this office visit / same day charting = 30 min

## 2024-06-10 DIAGNOSIS — J449 Chronic obstructive pulmonary disease, unspecified: Secondary | ICD-10-CM | POA: Diagnosis not present

## 2024-06-26 DIAGNOSIS — Z23 Encounter for immunization: Secondary | ICD-10-CM | POA: Diagnosis not present

## 2024-06-26 DIAGNOSIS — Z0000 Encounter for general adult medical examination without abnormal findings: Secondary | ICD-10-CM | POA: Diagnosis not present

## 2024-06-26 DIAGNOSIS — M810 Age-related osteoporosis without current pathological fracture: Secondary | ICD-10-CM | POA: Diagnosis not present

## 2024-06-26 DIAGNOSIS — J449 Chronic obstructive pulmonary disease, unspecified: Secondary | ICD-10-CM | POA: Diagnosis not present

## 2024-06-26 DIAGNOSIS — I2781 Cor pulmonale (chronic): Secondary | ICD-10-CM | POA: Diagnosis not present

## 2024-06-26 DIAGNOSIS — I700 Atherosclerosis of aorta: Secondary | ICD-10-CM | POA: Diagnosis not present

## 2024-06-26 DIAGNOSIS — D6869 Other thrombophilia: Secondary | ICD-10-CM | POA: Diagnosis not present

## 2024-06-26 DIAGNOSIS — I5189 Other ill-defined heart diseases: Secondary | ICD-10-CM | POA: Diagnosis not present

## 2024-06-26 DIAGNOSIS — E46 Unspecified protein-calorie malnutrition: Secondary | ICD-10-CM | POA: Diagnosis not present

## 2024-06-26 DIAGNOSIS — E1169 Type 2 diabetes mellitus with other specified complication: Secondary | ICD-10-CM | POA: Diagnosis not present

## 2024-06-26 DIAGNOSIS — E039 Hypothyroidism, unspecified: Secondary | ICD-10-CM | POA: Diagnosis not present

## 2024-06-26 DIAGNOSIS — J9611 Chronic respiratory failure with hypoxia: Secondary | ICD-10-CM | POA: Diagnosis not present

## 2024-06-26 DIAGNOSIS — I4892 Unspecified atrial flutter: Secondary | ICD-10-CM | POA: Diagnosis not present

## 2024-06-26 DIAGNOSIS — E7800 Pure hypercholesterolemia, unspecified: Secondary | ICD-10-CM | POA: Diagnosis not present

## 2024-06-28 ENCOUNTER — Other Ambulatory Visit (HOSPITAL_BASED_OUTPATIENT_CLINIC_OR_DEPARTMENT_OTHER): Payer: Self-pay | Admitting: Internal Medicine

## 2024-06-28 DIAGNOSIS — M810 Age-related osteoporosis without current pathological fracture: Secondary | ICD-10-CM

## 2024-06-28 DIAGNOSIS — E1169 Type 2 diabetes mellitus with other specified complication: Secondary | ICD-10-CM | POA: Diagnosis not present

## 2024-07-10 DIAGNOSIS — J449 Chronic obstructive pulmonary disease, unspecified: Secondary | ICD-10-CM | POA: Diagnosis not present

## 2024-10-02 ENCOUNTER — Other Ambulatory Visit: Payer: Self-pay | Admitting: Primary Care
